# Patient Record
Sex: Female | Born: 1945 | Race: White | Hispanic: No | Smoking: Never smoker
Health system: Southern US, Community
[De-identification: ages and names within clinical notes are randomized; demographics above are authoritative.]

## PROBLEM LIST (undated history)

## (undated) DIAGNOSIS — I10 Essential (primary) hypertension: Secondary | ICD-10-CM

## (undated) DIAGNOSIS — F419 Anxiety disorder, unspecified: Secondary | ICD-10-CM

## (undated) DIAGNOSIS — M109 Gout, unspecified: Secondary | ICD-10-CM

## (undated) DIAGNOSIS — I350 Nonrheumatic aortic (valve) stenosis: Secondary | ICD-10-CM

## (undated) DIAGNOSIS — H409 Unspecified glaucoma: Secondary | ICD-10-CM

## (undated) DIAGNOSIS — F32A Depression, unspecified: Secondary | ICD-10-CM

## (undated) DIAGNOSIS — F329 Major depressive disorder, single episode, unspecified: Secondary | ICD-10-CM

## (undated) DIAGNOSIS — R002 Palpitations: Secondary | ICD-10-CM

## (undated) DIAGNOSIS — M1990 Unspecified osteoarthritis, unspecified site: Secondary | ICD-10-CM

## (undated) DIAGNOSIS — E785 Hyperlipidemia, unspecified: Secondary | ICD-10-CM

## (undated) DIAGNOSIS — R55 Syncope and collapse: Principal | ICD-10-CM

## (undated) HISTORY — DX: Hyperlipidemia, unspecified: E78.5

## (undated) HISTORY — DX: Essential (primary) hypertension: I10

## (undated) HISTORY — PX: TONSILECTOMY, ADENOIDECTOMY, BILATERAL MYRINGOTOMY AND TUBES: SHX2538

## (undated) HISTORY — PX: SHOULDER SURGERY: SHX246

## (undated) HISTORY — PX: KNEE ARTHROPLASTY: SHX992

## (undated) HISTORY — DX: Palpitations: R00.2

## (undated) HISTORY — DX: Nonrheumatic aortic (valve) stenosis: I35.0

## (undated) HISTORY — PX: APPENDECTOMY: SHX54

## (undated) HISTORY — DX: Depression, unspecified: F32.A

## (undated) HISTORY — DX: Gout, unspecified: M10.9

## (undated) HISTORY — DX: Syncope and collapse: R55

## (undated) HISTORY — PX: PARTIAL HYSTERECTOMY: SHX80

## (undated) HISTORY — DX: Major depressive disorder, single episode, unspecified: F32.9

## (undated) HISTORY — PX: JOINT REPLACEMENT: SHX530

## (undated) HISTORY — DX: Unspecified osteoarthritis, unspecified site: M19.90

---

## 1999-02-12 ENCOUNTER — Ambulatory Visit (HOSPITAL_COMMUNITY): Admission: RE | Admit: 1999-02-12 | Discharge: 1999-02-12 | Payer: Self-pay | Admitting: Obstetrics and Gynecology

## 2005-01-14 ENCOUNTER — Ambulatory Visit: Payer: Self-pay | Admitting: Orthopedic Surgery

## 2005-01-21 ENCOUNTER — Ambulatory Visit (HOSPITAL_COMMUNITY): Admission: RE | Admit: 2005-01-21 | Discharge: 2005-01-21 | Payer: Self-pay | Admitting: Orthopedic Surgery

## 2005-01-27 ENCOUNTER — Ambulatory Visit: Payer: Self-pay | Admitting: Orthopedic Surgery

## 2005-02-09 ENCOUNTER — Inpatient Hospital Stay (HOSPITAL_COMMUNITY): Admission: RE | Admit: 2005-02-09 | Discharge: 2005-02-10 | Payer: Self-pay | Admitting: Orthopedic Surgery

## 2005-02-09 ENCOUNTER — Ambulatory Visit: Payer: Self-pay | Admitting: Orthopedic Surgery

## 2005-02-15 ENCOUNTER — Encounter: Payer: Self-pay | Admitting: Orthopedic Surgery

## 2005-02-18 ENCOUNTER — Ambulatory Visit: Payer: Self-pay | Admitting: Orthopedic Surgery

## 2005-03-04 ENCOUNTER — Ambulatory Visit: Payer: Self-pay | Admitting: Orthopedic Surgery

## 2005-03-25 ENCOUNTER — Ambulatory Visit: Payer: Self-pay | Admitting: Orthopedic Surgery

## 2005-05-10 ENCOUNTER — Ambulatory Visit: Payer: Self-pay | Admitting: Orthopedic Surgery

## 2005-06-21 ENCOUNTER — Ambulatory Visit: Payer: Self-pay | Admitting: Orthopedic Surgery

## 2005-08-30 ENCOUNTER — Ambulatory Visit: Payer: Self-pay | Admitting: Orthopedic Surgery

## 2005-12-02 ENCOUNTER — Ambulatory Visit: Payer: Self-pay | Admitting: Orthopedic Surgery

## 2005-12-13 ENCOUNTER — Ambulatory Visit: Payer: Self-pay | Admitting: Orthopedic Surgery

## 2005-12-20 ENCOUNTER — Ambulatory Visit: Payer: Self-pay | Admitting: Orthopedic Surgery

## 2005-12-27 ENCOUNTER — Ambulatory Visit: Payer: Self-pay | Admitting: Orthopedic Surgery

## 2006-01-27 ENCOUNTER — Ambulatory Visit: Payer: Self-pay | Admitting: Orthopedic Surgery

## 2007-07-26 ENCOUNTER — Ambulatory Visit: Payer: Self-pay | Admitting: Orthopedic Surgery

## 2007-07-26 DIAGNOSIS — M1710 Unilateral primary osteoarthritis, unspecified knee: Secondary | ICD-10-CM

## 2007-07-26 DIAGNOSIS — IMO0002 Reserved for concepts with insufficient information to code with codable children: Secondary | ICD-10-CM | POA: Insufficient documentation

## 2007-08-03 ENCOUNTER — Ambulatory Visit: Payer: Self-pay | Admitting: Orthopedic Surgery

## 2007-08-10 ENCOUNTER — Ambulatory Visit: Payer: Self-pay | Admitting: Orthopedic Surgery

## 2007-08-15 ENCOUNTER — Ambulatory Visit: Payer: Self-pay | Admitting: Orthopedic Surgery

## 2007-09-21 ENCOUNTER — Ambulatory Visit: Payer: Self-pay | Admitting: Orthopedic Surgery

## 2007-12-25 ENCOUNTER — Ambulatory Visit: Payer: Self-pay | Admitting: Orthopedic Surgery

## 2008-01-16 ENCOUNTER — Telehealth: Payer: Self-pay | Admitting: Orthopedic Surgery

## 2008-01-16 ENCOUNTER — Encounter: Payer: Self-pay | Admitting: Orthopedic Surgery

## 2008-01-17 ENCOUNTER — Telehealth: Payer: Self-pay | Admitting: Orthopedic Surgery

## 2008-01-23 ENCOUNTER — Ambulatory Visit: Payer: Self-pay | Admitting: Orthopedic Surgery

## 2008-01-23 ENCOUNTER — Inpatient Hospital Stay (HOSPITAL_COMMUNITY): Admission: RE | Admit: 2008-01-23 | Discharge: 2008-01-26 | Payer: Self-pay | Admitting: Orthopedic Surgery

## 2008-01-25 ENCOUNTER — Encounter: Payer: Self-pay | Admitting: Orthopedic Surgery

## 2008-01-26 ENCOUNTER — Encounter: Payer: Self-pay | Admitting: Orthopedic Surgery

## 2008-01-29 ENCOUNTER — Encounter: Payer: Self-pay | Admitting: Orthopedic Surgery

## 2008-01-29 ENCOUNTER — Telehealth: Payer: Self-pay | Admitting: Orthopedic Surgery

## 2008-02-05 ENCOUNTER — Encounter: Payer: Self-pay | Admitting: Orthopedic Surgery

## 2008-02-06 ENCOUNTER — Ambulatory Visit: Payer: Self-pay | Admitting: Orthopedic Surgery

## 2008-02-06 DIAGNOSIS — Z96659 Presence of unspecified artificial knee joint: Secondary | ICD-10-CM | POA: Insufficient documentation

## 2008-02-16 ENCOUNTER — Telehealth: Payer: Self-pay | Admitting: Orthopedic Surgery

## 2008-02-26 ENCOUNTER — Encounter: Payer: Self-pay | Admitting: Orthopedic Surgery

## 2008-03-05 ENCOUNTER — Encounter: Payer: Self-pay | Admitting: Orthopedic Surgery

## 2008-03-06 ENCOUNTER — Ambulatory Visit: Payer: Self-pay | Admitting: Orthopedic Surgery

## 2008-04-15 ENCOUNTER — Encounter: Payer: Self-pay | Admitting: Orthopedic Surgery

## 2008-04-17 ENCOUNTER — Ambulatory Visit: Payer: Self-pay | Admitting: Orthopedic Surgery

## 2008-05-20 ENCOUNTER — Telehealth: Payer: Self-pay | Admitting: Orthopedic Surgery

## 2008-05-20 ENCOUNTER — Encounter: Payer: Self-pay | Admitting: Orthopedic Surgery

## 2008-05-21 ENCOUNTER — Inpatient Hospital Stay (HOSPITAL_COMMUNITY): Admission: RE | Admit: 2008-05-21 | Discharge: 2008-05-24 | Payer: Self-pay | Admitting: Orthopedic Surgery

## 2008-05-21 ENCOUNTER — Ambulatory Visit: Payer: Self-pay | Admitting: Orthopedic Surgery

## 2008-05-23 ENCOUNTER — Encounter: Payer: Self-pay | Admitting: Orthopedic Surgery

## 2008-05-24 ENCOUNTER — Encounter: Payer: Self-pay | Admitting: Orthopedic Surgery

## 2008-05-25 ENCOUNTER — Encounter: Payer: Self-pay | Admitting: Orthopedic Surgery

## 2008-05-27 ENCOUNTER — Encounter (INDEPENDENT_AMBULATORY_CARE_PROVIDER_SITE_OTHER): Payer: Self-pay | Admitting: Pulmonary Disease

## 2008-05-27 ENCOUNTER — Telehealth: Payer: Self-pay | Admitting: Orthopedic Surgery

## 2008-05-28 ENCOUNTER — Telehealth: Payer: Self-pay | Admitting: Orthopedic Surgery

## 2008-06-04 ENCOUNTER — Ambulatory Visit: Payer: Self-pay | Admitting: Orthopedic Surgery

## 2008-06-05 ENCOUNTER — Encounter: Payer: Self-pay | Admitting: Orthopedic Surgery

## 2008-06-11 ENCOUNTER — Encounter: Payer: Self-pay | Admitting: Orthopedic Surgery

## 2008-06-11 ENCOUNTER — Telehealth: Payer: Self-pay | Admitting: Orthopedic Surgery

## 2008-06-17 ENCOUNTER — Encounter: Payer: Self-pay | Admitting: Orthopedic Surgery

## 2008-07-01 ENCOUNTER — Encounter: Payer: Self-pay | Admitting: Orthopedic Surgery

## 2008-07-03 ENCOUNTER — Ambulatory Visit: Payer: Self-pay | Admitting: Orthopedic Surgery

## 2008-07-03 DIAGNOSIS — M25519 Pain in unspecified shoulder: Secondary | ICD-10-CM | POA: Insufficient documentation

## 2008-07-03 HISTORY — DX: Pain in unspecified shoulder: M25.519

## 2008-07-24 ENCOUNTER — Encounter: Payer: Self-pay | Admitting: Orthopedic Surgery

## 2008-08-05 ENCOUNTER — Encounter: Payer: Self-pay | Admitting: Orthopedic Surgery

## 2008-08-14 ENCOUNTER — Ambulatory Visit: Payer: Self-pay | Admitting: Orthopedic Surgery

## 2009-01-29 ENCOUNTER — Ambulatory Visit: Payer: Self-pay | Admitting: Orthopedic Surgery

## 2009-01-31 ENCOUNTER — Encounter: Payer: Self-pay | Admitting: Orthopedic Surgery

## 2010-05-18 ENCOUNTER — Ambulatory Visit: Payer: Self-pay | Admitting: Orthopedic Surgery

## 2010-05-18 DIAGNOSIS — M19079 Primary osteoarthritis, unspecified ankle and foot: Secondary | ICD-10-CM | POA: Insufficient documentation

## 2010-05-26 ENCOUNTER — Telehealth: Payer: Self-pay | Admitting: Orthopedic Surgery

## 2010-06-05 ENCOUNTER — Telehealth: Payer: Self-pay | Admitting: Orthopedic Surgery

## 2010-06-25 ENCOUNTER — Encounter: Payer: Self-pay | Admitting: Orthopedic Surgery

## 2010-09-22 NOTE — Progress Notes (Signed)
Summary: Appointment with Dr. Lestine Box.  Phone Note Outgoing Call   Call placed by: Waldon Reining,  June 05, 2010 10:06 AM Call placed to: Patient Action Taken: Appt scheduled Summary of Call: Patient has an appointment with Dr. Lestine Box on 06-23-10 at 2:30.

## 2010-09-22 NOTE — Assessment & Plan Note (Signed)
Summary: YEARLY TKA RE-CK/XRAYS BILAT KNEES/[employer] FED/CAF   Visit Type:  Follow-up  CC:  recheck bilateral TKA's.  History of Present Illness: I saw [name] in the office today for a followup visit.  She is a 65 years old woman with the complaint of:    s/p bilateral TKA, for xrays today.  Lt 04/2008  Rt 01/2008  Doing WELL, some pain lateral right leg, been going on for 8 months, no injury. She can scratch the lateral part of right leg with finger nails and feel tingling IN THE right knee.  CURRENT MEDS:  Celebrex 200mg  1 in the morning and 1 in the evening for right foot pain, for OA. Vicodin 5 when needed  Gabapentin 100mg  in the am and pm  helps for the foot pain,     Allergies: 1)  ! Sulfa  Physical Exam  Additional Exam:     *GEN: appearance was normal   ** CDV: normal pulses temperature and no edema  * SKIN was normal   * Neuro:abnormal sensation, lateral to the incision near the fibular head there is tenderess and tinels sign  ** Psyche: AAO x 3 and mood was normal   MSK *Gait was normal  *Inspection the right knee looked great, no effusion, there is tenderness over ITB distally  *ROM- 120 degrees  *Motor- 5/5  *Stability-is nomal      Impression & Recommendations:  Problem # 1:  TOTAL KNEE FOLLOW-UP (ICD-V43.65) Assessment Improved  Orders: Est. Patient Level III (16109) Knees  x-ray bilateral ([id number]) The  alignment was normal, PTF reduced without tilt or subluxation, no evidence of loosening.  IMPRESSION: normal appearance of the implant   Problem # 2:  ARTHRITIS, RIGHT FOOT (ICD-716.97) Assessment: Comment Only I looked at the x-rays that she had from the podiatrist and she has some midtarsal arthritis which I think is better treated by a foot and ankle specialist vs. my treatment. Orders: Orthopedic Surgeon Referral (Ortho Surgeon)  Medications Added to Medication List This Visit: 1)  Vicodin 5-500 Mg Tabs  (Hydrocodone-acetaminophen) .Marland Kitchen.. 1 by mouth q 4 as needed pain 2)  Gabapentin 100 Mg Caps (Gabapentin) .Marland Kitchen.. 1 by mouth three times a day   [90 dya supply]  Patient Instructions: 1)  Consult Dr Lestine Box for mid foot arthritis  2)  yearly f/u for TKA  Prescriptions: VICODIN 5-500 MG TABS (HYDROCODONE-ACETAMINOPHEN) 1 by mouth q 4 as needed pain  #90 x 5   Entered and Authorized by:   Fuller Canada MD   Signed by:   Fuller Canada MD on 05/18/2010   Method used:   Print then Give to Patient   RxID:   [id number] CELEBREX 100MG    #90 day suppl x 0   Entered and Authorized by:   Fuller Canada MD   Signed by:   Fuller Canada MD on 05/18/2010   Method used:   Print then Give to Patient   RxID:   [id number] GABAPENTIN 100 MG CAPS (GABAPENTIN) 1 by mouth three times a day   [90 dya supply]  #270 x 5   Entered and Authorized by:   Fuller Canada MD   Signed by:   Fuller Canada MD on 05/18/2010   Method used:   Print then Give to Patient   RxID:   [id number] VICODIN 5-500 MG TABS (HYDROCODONE-ACETAMINOPHEN) 1 by mouth q 4 as needed pain  #90 x 5   Entered and Authorized by:   Fuller Canada MD   Signed  by:   Fuller Canada MD on 05/18/2010   Method used:   Handwritten   RxID:   [id number]

## 2010-09-22 NOTE — Consult Note (Signed)
Summary: Consult note from Dr, Lestine Box  Consult note from Dr, Lestine Box   Imported By: Jacklynn Ganong 06/25/2010 15:04:51  _____________________________________________________________________  External Attachment:    Type:   Image     Comment:   External Document

## 2010-09-22 NOTE — Progress Notes (Signed)
Summary: Referral to Dr. Lestine Box  Phone Note Outgoing Call   Call placed by: Waldon Reining,  May 26, 2010 3:35 PM Call placed to: Specialist Action Taken: Information Sent Summary of Call: I faxed a referral for this patient to Dr. Lestine Box to be seen for arthritis of her right foot.

## 2011-01-05 NOTE — Group Therapy Note (Signed)
NAMEKAILANI, BRASS NO.:  [id number]   MEDICAL RECORD NO.:  [id number]          PATIENT TYPE:  INP   LOCATION:  A302                          FACILITY:  [facility]   PHYSICIAN:  Vickki Hearing, M.D.DATE OF BIRTH:  February 15, 1946   DATE OF PROCEDURE:  DATE OF DISCHARGE:                                 PROGRESS NOTE   This is her postop day #2 status post right total knee replacement.  Her  protime is 1.7.  Her hemoglobin is 13.7.  She is very upset this  morning.  She has a clean wound, minimal swelling.  She is  neurovascularly intact.  Plan is to continue physical therapy.      Vickki Hearing, M.D.  Electronically Signed     SEH/MEDQ  D:  01/25/2008  T:  01/25/2008  Job:  [id number]

## 2011-01-05 NOTE — Op Note (Signed)
NAMETAVON, CORRIHER NO.:  [id number]   MEDICAL RECORD NO.:  [id number]          PATIENT TYPE:  INP   LOCATION:  A302                          FACILITY:  [facility]   PHYSICIAN:  Vickki Hearing, M.D.DATE OF BIRTH:  1946/05/31   DATE OF PROCEDURE:  01/23/2008  DATE OF DISCHARGE:                               OPERATIVE REPORT   HISTORY:  She is 65 year old female with chronic knee pain, was treated  nonoperatively with analgesics, activity modification, attempted weight  loss, Synvisc injections, cortisone injection did not improve, presented  for right total knee replacement for right knee arthritis.   PREOPERATIVE DIAGNOSIS:  Osteoarthritis, right knee.   POSTOPERATIVE DIAGNOSIS:  Osteoarthritis, right knee.   PROCEDURE:  Right total knee arthroplasty.   IMPLANTS:  We used a DePuy Sigma posterior stabilized implants, 3 femur,  a size 2.5 tibia, a size 10 polyethylene, a 32 patella, 8-mm thickness.   SURGEON:  Vickki Hearing, MD   ASSISTED BY:  Margaree Mackintosh and Gregg Nation.   ANESTHETIC:  General.   OPERATIVE FINDINGS:  There was a complete cartilage loss on the medial  femoral condyle and tibial plateau.  There was mild varus malalignment  and mild flexion contracture.  Preop flexion was 125 degrees under  anesthesia.   The patient was identified in the preop area.  Proper site  identification and marking performed on the right knee.  History and  physical update and starting, and the patient was taken to the operating  room.  After general anesthesia, she was given antibiotics, Foley  catheter was inserted, prepped with DuraPrep, and draped sterilely.  Time-out procedure was then completed.  Tourniquet was elevated after  exsanguination of the limb, it was elevated 300 mmHg where it stayed for  84 minutes.   With the knee in flexion, a midline incision was made followed by medial  arthrotomy.  The medial soft tissue was dissected to the mid  coronal  plane. ACL and PCL were resected.  Menisci were resected.  Tibia was  subluxated forward.  At that point, the cartilage loss was noted.   The tibial guide was set for a neutral cut referencing from the high  lateral side, 10-mm bone resection was performed, tibia was then  measured to a 2.5 with the tibial tray.  Femur was set for 5-degree  right, 10-mm distal resection.  A drill was passed through the canal,  canal was suctioned and irrigated, distal cutting block was pinned, and  10 mm of bone was resected, the left medial side was the higher side.   Spacer blocks were then placed with a 10 spacer with a shim and this  gave full extension.   The femur was then sized to a 3.5, we pinned for 3 downsizing the femur,  then the anterior/posterior and chamfer cuts were made.the flexion and  extension gaps were tested , again they were balanced with a 10-mm  insert, then I made the box cut.  We then measured the patella to a size  24-mm thickness, resected 8 mm of bone, and sized for a 32 patella  implant.  Trial reduction was then performed.  Full extension was obtained.  Flexion to 115 degrees was obtained.  There was no collateral ligament  instability.  AP in stability was also intact. Patella tracked normally.  The tibial preparation was completed by ream and punch.   The knee was washed and dried.  The implants were cemented in place.  Excess cement was removed.  The cement was cured.  Radiograph was  obtained.  A 10-mm insert was placed prior to radiographic evaluation.  The implants were in good position.  The wound was irrigated again.  Closure was performed with #1 Bralon suture in interrupted fashion  followed by 0 Monocryl and 2-0 Monocryl over a pain pump catheter.  A 30  mL of Marcaine was injected into the soft tissue prior to closure.   Staples were used to reapproximate the skin edges.  Sterile dressings,  Cryo/Cuff, and Ace bandages were applied.  The patient  was extubated and  taken to recovery in stable condition.   POSTOPERATIVE PLAN:  For standard postop total knee protocol using  Coumadin and foot pumps for DVT prevention.      Vickki Hearing, M.D.  Electronically Signed     SEH/MEDQ  D:  01/23/2008  T:  01/23/2008  Job:  [id number]

## 2011-01-05 NOTE — H&P (Signed)
NAMEKENDELLE, SCHWEERS NO.:  [id number]   MEDICAL RECORD NO.:  [id number]          PATIENT TYPE:  AMB   LOCATION:  DAY                           FACILITY:  [facility]   PHYSICIAN:  Vickki Hearing, M.D.DATE OF BIRTH:  Feb 09, 1946   DATE OF ADMISSION:  DATE OF DISCHARGE:  LH                              HISTORY & PHYSICAL   CHIEF COMPLAINT:  Left knee pain.   HISTORY:  This is a 65 year old female status post right total knee  arthroplasty January 23, 2008.  She did well.   She complains of increasing left knee pain, weakness getting out of a  chair, difficulty with activities of daily living related to the left  knee.  Her pain has become severe.  She presents for a left total knee  arthroplasty.   MEDICATIONS:  Include:  1. Ziac 2.5.  2. Celebrex 100 mg.  3. Dyazide.  4. Ultracet.  5. Lorazepam 0.5.  6. Effexor XR 75 mg.  7. Co-Q10.  8. One-A-Day vitamin.  9. Fish oil.  10.Neurontin 300 mg 3 times a day.  11.Robaxin 500 mg q.6 h. p.r.n.   ALLERGIES:  TO SULFA.   MEDICAL PROBLEMS:  None other than arthritis and hypertension.   PAST SURGICAL HISTORY:  1. Hysterectomy.  2. Appendectomy.  3. Tonsillectomy.  4. Right rotator cuff repair June 2006 by Dr. Romeo Apple.   FAMILY HISTORY:  Noncontributory.   SOCIAL HISTORY:  Negative.   EXAMINATION:  VITAL SIGNS:  Are stable and normal.  APPEARANCE OF THE PATIENT:  Normal development, nutrition, grooming,  hygiene;  body habitus endomorphic.  CARDIOVASCULAR OBSERVATION:  No swelling or varicosities.  PALPATION:  Pulses and temperature normal.  No edema or tenderness.  LYMPHATIC SYSTEM:  Negative axilla, cervical, supraclavicular, groin.  SKIN:  Lower extremities normal.  Surgical incisions well healed on the  right shoulder and right knee.  NEUROLOGICAL:  Coordination normal.  Reflexes normal x4.  Sensation  normal x4 extremities.  Orientation to time,  person, place.  Mood and  affect normal.  UPPER  EXTREMITIES:  Negative for malalignment, contracture, subluxation,  tremor or spasticity.  RIGHT LOWER EXTREMITY:  0 to 115 degrees knee flexion and range of  motion, strength, muscle tone, stability normal.  LEFT KNEE:  She had significant medial joint line tenderness.  Flexion  is about 120 degrees with a 5 degrees extensor lag.  Strength is normal.  Stability is normal.  Muscle tone is normal   Our most recent x-rays show severe medial compartment narrowing with  patellofemoral spurs inferiorly and superiorly consistent with  osteoarthritis.   PLAN:  Left total knee arthroplasty with DePuy knee system.  Surgery  scheduled for May 21, 2008, with a 2-week follow-up appointment.      Vickki Hearing, M.D.  Electronically Signed     SEH/MEDQ  D:  05/20/2008  T:  05/20/2008  Job:  [id number]

## 2011-01-05 NOTE — Op Note (Signed)
NAMEDESTANE, SPEAS NO.:  [id number]   MEDICAL RECORD NO.:  [id number]          PATIENT TYPE:  INP   LOCATION:  A304                          FACILITY:  [facility]   PHYSICIAN:  Vickki Hearing, M.D.DATE OF BIRTH:  [birth year]   DATE OF PROCEDURE:  05/21/2008  DATE OF DISCHARGE:                               OPERATIVE REPORT   She is now 65 years old.  She presented with osteoarthritis of her left  knee status post right total knee arthroplasty.  She complained of  increasing pain.  She complained of trouble with activities of daily  living, and she failed conservative treatment of oral medication,  exercise, and injection.   PREOPERATIVE DIAGNOSIS:  Osteoarthritis of left knee.   POSTOPERATIVE DIAGNOSIS:  Osteoarthritis of left knee.   PROCEDURE:  Left total knee arthroplasty with DePuy Sigma posterior-  stabilized total knee system, 2-1/2 femur, 2-1/2 tibia, 10-mm cross-  linked polyethylene insert, size 35 patella.   SURGEON:  Vickki Hearing, MD.   ASSISTANTS:  Deniece Portela McFatter and [place] Nation.   ANESTHETIC:  General.   OPERATIVE FINDINGS:  There was a varus knee with a flexion contracture  of 5 degrees.  The varus deformity was mild.  The preop flexion was 115  degrees under anesthesia.  The medial side was severely worn.  The  patellofemoral joint had significant osteophytes and a grade 3 lesion  over the medial facet.   The procedure was done as follows.  The patient was identified as  [name].  She marked the left knee as the surgical site and I  countersigned it, I then updated her history and physical.  She was  started on Ancef and taken to surgery where she had general anesthetic.  She had a Foley catheter placed.  She had the left knee prepped with  DuraPrep and draped sterilely.   At this point, the time-out was completed.  All agreed that a left total  knee arthroplasty was to be performed.  All parameters were  satisfied.   The limb was exsanguinated with a 6-inch Esmarch.  The tourniquet was  inflated at 300 mmHg.  The knee was placed in flexion and a midline  incision was made.  The incision was carried down to the extensor  mechanism.  A medial arthrotomy was performed and the patella was  everted.  The patellofemoral ligament was released.  The medial soft  tissue sleeve was elevated to the mid-coronal plane.  The fat pad was  excised along with the ACL and PCL and the tibia was subluxated forward.   The femur was prepared by drilling the femoral canal, decompression and  with suction and irrigation and setting the cut up for 5-degree left  with 10-mm resection.  This was pinned and the resection was made.  An  additional 2 mm were resected.   With the tibia subluxated forward, the tibial cut was set for neutral  cut referencing the lateral side, the unworn side, with an 8-mm  resection.  This was made with an oscillating saw.   The extension gap was checked and a 10-mm spacer  block gave a nice fit  with collateral ligament stability.   The femur was sized between a 2.5 and a 3, and we chose the 2.5  effectively downsizing the femur.  We checked the epicondylar axis and  pinned in external rotation and then made 4 cuts on the femur with a 4  in 1 cutting block.  We cleaned out the posterior condyles with an  osteotome resecting bone and soft tissue and releasing the posterior  capsule.  We placed the box cutting guide, pinned it in place assuring  that there was no medial overhang, and made the box cut.  We then  skeletonized the patella, measured a 22-mm thickness and resected it  down to a 12-mm thickness, and measured a size 35 button.   All trials were then placed.  The knee was taken through a range of  motion and the tibial tray was set at the rotation dictated by range of  motion.   The lug drills holes were drilled, the femoral component and patella  component was removed,  and the patella had excellent tracking.  We then  punched the tibia.   The knee was irrigated, the bone was dried, and the cement was mixed on  the back table.  The implants were then implanted with cement.  Excess  cement was removed and the cement was allowed to cure.   After all components were in place, a trial 10-mm insert was placed.  We  achieved normal patellar tracking, 115 degrees of knee flexion, 2  degrees of hyperextension, and collateral ligament stability.   Trial insert was removed.  Permanent insert was placed and 10-mm cross-  linked polyethylene posterior stabilized insert.   The wound was then irrigated.  The soft tissues were injected 30 mL of  Marcaine with epinephrine.  Knee was closed in flexion with  #1 interrupted fashion for the extensor mechanism.  We closed the subcu  over a pain pump with 0 Monocryl.  We applied staples.  We took x-rays  interop, checked the knee range of motion.  There was no subluxation of  the patella.  We had 2 degrees hyperextension and 115 degrees of knee  flexion.   Sterile bandages were applied.  Cryo/Cuff was applied.  The patient was  extubated and taken to recovery room in stable condition.   The patient will be on foot pumps and Coumadin.  She will get a CPM  machine and start early motion and physical therapy.  She is requesting  to go home on Thursday.      Vickki Hearing, M.D.  Electronically Signed     SEH/MEDQ  D:  05/21/2008  T:  05/21/2008  Job:  [id number]

## 2011-01-05 NOTE — H&P (Signed)
[name], [name] NO.:  [id number]   MEDICAL RECORD NO.:  [id number]          PATIENT TYPE:  AMB   LOCATION:  DAY                           FACILITY:  [facility]   PHYSICIAN:  Vickki Hearing, M.D.DATE OF BIRTH:  1945-12-10   DATE OF ADMISSION:  01/23/2008  DATE OF DISCHARGE:  LH                              HISTORY & PHYSICAL   CHIEF COMPLAINT:  Right knee pain.   HISTORY:  This is a 65 year old female who has been followed with right  knee pain for several months.  She has had nonoperative treatment which  included but was not limited to rest, activity modifications, Synvisc  injections analgesic medications.  She did not improve; her function  continued to decrease.  She was advised to have knee replacement.  She  reviewed the total knee patient information.   We discussed the risks and benefits of the procedure, completed the  informed consent process in the office.   MEDICATIONS:  1. Ziac 2.5 mg.  2. Dyazide.  3. Lorazepam 0.5 mg.  4. Effexor XR 75 mg.  5. Coenzyme Q.  6. One A Day vitamin.  7. Fish oil.  8. Vicodin pain medicine.  9. Celebrex 100 mg.   CURRENT ALLERGIES:  SULFONAMIDES.   PAST MEDICAL HISTORY:  Negative.   PAST SURGICAL HISTORY:  1. Hysterectomy.  2. Appendectomy.  3. Tonsillectomy.  4. Right rotator cuff repair, February 09, 2005, at [facility] by      Dr. Romeo Apple.   FAMILY HISTORY:  Noncontributory.   SOCIAL HISTORY:  Noncontributory.   REVIEW OF SYSTEMS:  Negative general, cardiac, respiratory, GI, GU,  neuro, endocrine, psych, derm, ENT, immunologic and lymphatic.   EXAM:  Vital signs are normal.  APPEARANCE:  Well-developed and nourished; grooming and hygiene are  normal; body habitus mildly overweight.  HEAD:  Normocephalic, atraumatic.  LUNGS:  Clear to auscultation and percussion bilaterally.  HEART:  Rate and rhythm normal, S1 and S2 without murmurs, rubs, gallops  or clicks.  Pulses are normal in all  4 extremities.  EXTREMITIES:  No clubbing, cyanosis, edema or deformity.  SKIN:  Intact without palpable lesions or visible rashes.  CERVICAL LYMPH NODES:  Negative.  PSYCHIATRIC:  She is awake, alert and oriented x3 with normal mood and  affect, normal attention and concentration.  RIGHT UPPER EXTREMITY:  Normal appearance, range of motion, strength,  and stability.  LEFT UPPER EXTREMITY:  Medial scapular border was tender, consistent  with trigger point.  No impingement signs were noted.  No instability  was noted; strength was normal.  KNEE EXAMINATION:  Heel-to-toe gait pattern noted bilaterally.  There  was tenderness, mild swelling of the right knee with range of motion 120  degrees with 5-degree flexion contracture.  Knee stability was normal.   IMPRESSION:  Osteoarthritis of the right knee, 715.96.   PLAN:  Right total knee arthroplasty with DePuy implant.   Informed consent process included discussion of bleeding, infection  which would cause a need for 2-stage reimplantation and antibiotics;  stiffness if therapy protocol not followed, and other problems with knee  replacements that  can occur.   The patient's preoperative appointment is set for May 28; postop  appointment is set for June 16.      Vickki Hearing, M.D.  Electronically Signed     SEH/MEDQ  D:  01/22/2008  T:  01/22/2008  Job:  [id number]   cc:   [facility]

## 2011-01-05 NOTE — Discharge Summary (Signed)
NAMEJAYDAN, [name] NO.:  [id number]   MEDICAL RECORD NO.:  [id number]          PATIENT TYPE:  INP   LOCATION:  A304                          FACILITY:  [facility]   PHYSICIAN:  Vickki Hearing, M.D.DATE OF BIRTH:  05-11-1946   DATE OF ADMISSION:  05/21/2008  DATE OF DISCHARGE:  10/02/2009LH                               DISCHARGE SUMMARY   ADMITTING DIAGNOSIS:  Osteoarthritis of the left knee.   DISCHARGE DIAGNOSIS:  Osteoarthritis of the left knee.   She had a left total knee arthroplasty done by Dr. Romeo Apple on the day  of admission.   IMPLANTS USED:  She had a DePuy sigma posterior stabilized total knee  with a 2.5 femur, 2.5 tibia, a 10-mm cross-link polyethylene insert, and  a 35-mm patella.   HISTORY:  She is 65 years old.  She has arthritis of the left knee.  She  is status post right total knee arthroplasty.  She did well.  She  complained of increasing plain in the left knee.  She complained of  trouble with her activities of daily living.  She failed conservative  treatment of all medications, exercises, and injection and presented for  left total knee.   HOSPITAL COURSE:  Her hospital course was unremarkable.  She did well  and progressed well on therapy, had no major problems.  Hemoglobin at  discharge was 12 and INR was 1.5.   DISCHARGE MEDICATIONS:  1. Lorcet Plus 1 q.4h. p.r.n. for pain.  2. Coumadin 2.5 mg every day for 7 days.  3. She was to resume Wellbutrin XR 150 mg a day.  4. Neurontin 300 mg three times a day.  5. Celebrex 200 mg a day.  6. Triamterene and hydrochlorothiazide daily.  7. Lorazepam 0.5 q.6h. p.r.n. anxiety.  8. Pravastatin 20 mg orally daily.  9. Travatan both eyes daily.  10.Bisop/HCTZ 2.5/6.25 mg daily.   She was to follow up with me on June 04, 2008.  She had advanced home  care.  She had physical therapy to be done at home.   OVERALL CONDITION:  Improved.   CONDITION AT DISCHARGE:  Improved.  She was  discharged to home.      Vickki Hearing, M.D.  Electronically Signed     SEH/MEDQ  D:  06/25/2008  T:  06/26/2008  Job:  [id number]

## 2011-01-05 NOTE — Group Therapy Note (Signed)
NAMENICKAYLA, [name] NO.:  [id number]   MEDICAL RECORD NO.:  [id number]          PATIENT TYPE:  INP   LOCATION:  A302                          FACILITY:  [facility]   PHYSICIAN:  Vickki Hearing, M.D.DATE OF BIRTH:  [birth year]   DATE OF PROCEDURE:  DATE OF DISCHARGE:                                 PROGRESS NOTE   Postop day #1, right total knee arthroplasty.  Vital signs:  Blood  pressure 97.9, blood pressure 136/63, pain level 4 to 6, on Vicodin and  PCA pump.  She is doing well.  Pain has subsided significantly from  yesterday.  Neurovascular:  Leg is intact.  Dressing is dry and clean.   Labs show normal metabolic panel, protime is 1.2, hemoglobin is 13.7,  starting hemoglobin was 16.4.   She is in stable condition.  Plan is to start physical therapy.  We are  going to stop the iron and we will add 5 mg of Coumadin tonight.      Vickki Hearing, M.D.  Electronically Signed     SEH/MEDQ  D:  01/24/2008  T:  01/24/2008  Job:  [id number]

## 2011-01-08 NOTE — Discharge Summary (Signed)
NAMELUVERTA, KORTE NO.:  [id number]   MEDICAL RECORD NO.:  [id number]          PATIENT TYPE:  INP   LOCATION:  A327                          FACILITY:  [facility]   PHYSICIAN:  Vickki Hearing, M.D.DATE OF BIRTH:  Feb 22, 1946   DATE OF ADMISSION:  02/09/2005  DATE OF DISCHARGE:  06/21/2006LH                                 DISCHARGE SUMMARY   ADMISSION DIAGNOSIS:  Right rotator cuff tear.   HISTORY:  This is a 65 year old female who dislocated her right shoulder  October 20, 2004. She had a closed reduction under anesthesia by Dr.  Lorn Junes in [place]. She was immobilized for 6 weeks and then went on to  physical therapy. In physical therapy she never really regained her motion  and continued to have pain. She presented to me for an evaluation. We  scheduled her for an MRI. The MRI was done and showed a complete tear of the  supraspinatus and subscapularis with a dislocation of the biceps tendon. She  also had some infraspinatus tendinopathy superior labral tearing and  tendinopathy of the long head of the biceps tendon. Due to lack of motion,  weakness, it was recommended that she have surgery. She consented for  surgery understanding the risks and benefits of the procedure. Mainly that  she may not regain full motion of the arm. She accepted these risks and  presented on February 09, 2005 for surgery.   She underwent rotator cuff repair, open procedure involving the  supraspinatus and subscapularis tendons via delta pectoral approach on February 09, 2005. Operative findings included a complete retraction and tear of the  subscapularis tendon approximately 3 cm. She had a U shaped tear of the  supraspinatus tendon with retraction and poor distal tendon tissue and  therefore we added a Biograft to the repair to assist in healing.   She was admitted for pain control and for observation for neurovascular  checks. She tolerated that well, and was discharged on February 10, 2005.   DISPOSITION:  To home. She was in an immobilizer. She was scheduled to  follow up in the office on February 18, 2005. She was discharged on Lorcet +1  q.4h, Skelaxin 800 mg p.o. q.8h. She had restrictions which were given to  her about her upper extremity in terms of motion. Overall condition was  improved.   ADDENDUM:  She had a tissue mend Biograft placed and had three corkscrew II  suture anchors placed with size 2 fiber wire already attached. She was also  given a pain pump catheter.       SEH/MEDQ  D:  03/13/2005  T:  03/13/2005  Job:  [id number]

## 2011-01-08 NOTE — H&P (Signed)
[name], RENFRO NO.:  [id number]   MEDICAL RECORD NO.:  [id number]          PATIENT TYPE:  INP   LOCATION:  A327                          FACILITY:  [facility]   PHYSICIAN:  Vickki Hearing, M.D.DATE OF BIRTH:  03-27-1946   DATE OF ADMISSION:  DATE OF DISCHARGE:  LH                                HISTORY & PHYSICAL   PREOPERATIVE DIAGNOSIS:  Right rotator cuff tear.   HISTORY:  This is a 65 year old female who dislocated her right shoulder on  October 20, 2004. She had a closed reduction under anesthesia by Dr.  Lorn Junes in [place]. She was immobilized for 6 weeks and then went to PT  for 4 weeks. She complains of motion loss and pain. She had a MRI done which  showed complete tear of the supraspinatus and subscapularis and dislocation  of biceps tendon. She also had some infraspinatus tendinopathy, superior  labral tearing, and some tendinopathy of the long head of biceps tendon. The  patient came to me after her shoulder failure failed to progress during the  rehabilitation.   REVIEW OF SYSTEMS:  She has outlined history of depression, anxiety,  sinusitis, glaucoma, seasonal allergies, sinus problems, and bee sting  allergies. The other six systems were normal by the patient's own report.   ALLERGIES:  She is allergic to SULFA medications.   PAST MEDICAL HISTORY:  She has no major medical problems. She has had an  appendectomy, hysterectomy and tonsillectomy.   CURRENT MEDICATIONS:  1.  Ziac 2.5 mg.  2.  Celebrex.  3.  Dyazide 37.5 mg.  4.  Ultracet.  5.  Lorazepam 0.5 mg.  6.  Effexor 75 mg.  7.  One-A-Day vitamin.  8.  Coenzyme q. 10 a.m.  9.  MSN.  10. Fish oil.   FAMILY HISTORY:  Arthritis, cancer, diabetes.   FAMILY PHYSICIAN:  Dr. Donette Larry at [facility].   SOCIAL HISTORY:  She is married. She is housewife. She does not smoke, drink  or use caffeine. She completed her education through the 12th grade.   PHYSICAL  EXAMINATION:  GENERAL:  She is normally developed, well nourished.  Grooming and hygiene are excellent. No gross deformities. Body habitus is  mesomorphic.  PSYCHOLOGICAL EXAM:  She is alert and oriented x3 with normal mood.  NEUROLOGIC EXAM:  Shows normal sensation in her entire right upper  extremity.  CARDIOVASCULAR EXAM:  Observation and palpation are normal.  SKIN:  Normal.  CHEST:  Clear.  HEART:  Rate and rhythm normal.  ABDOMEN:  Soft.  EXTREMITIES:  Right shoulder active range of motion 30 degrees abduction.  Strength could not test. Stability could not test. Inspection showed  tenderness in the teres shoulder area.   Her passive range of motion was 110 of flexion, 40 of external rotation.  Internal rotation did not test. Gross supraspinatus and subscapularis  weakness is noted.   DATA:  MRI findings are as stated.   DIAGNOSIS:  Rotator cuff tear, supraspinatus and subscapularis, status post  dislocation, right shoulder.   PLAN:  Repair of supraspinatus and subscapularis tendons through a  deltopectoral approach.  I have explained the risk and benefits of the  procedure, severity and difficulty of the repair, expect four months to one  year recovery time, and expect a range of motion loss. This has been  communicated to the patient. The patient understands and wishes to go ahead  with the surgery.       SEH/MEDQ  D:  02/08/2005  T:  02/09/2005  Job:  [id number]

## 2011-01-08 NOTE — Discharge Summary (Signed)
[name], [name] NO.:  [id number]   MEDICAL RECORD NO.:  [id number]          PATIENT TYPE:  INP   LOCATION:  A302                          FACILITY:  [facility]   PHYSICIAN:  Vickki Hearing, M.D.DATE OF BIRTH:  1946-02-20   DATE OF ADMISSION:  01/23/2008  DATE OF DISCHARGE:  06/05/2009LH                               DISCHARGE SUMMARY   She was admitted with a diagnosis of osteoarthritis of the right knee.   OPERATIVE PROCEDURES:  On June 2, she had an uncomplicated right total  knee arthroplasty with a DePuy posterior stabilized size 3 femur, size  2.5 tibia with a 10 polyethylene insert, 32 patella with 8 mm of  thickness.  Surgeon was Romeo Apple, done under general anesthetic.   OPERATIVE FINDINGS:  Complete cartilage loss in the medial femoral  condyle and tibial plateau.  There was mild varus malalignment and mild  flexion contracture.  Preop flexion was 125 under anesthesia.   HOSPITAL COURSE:  The patient did well in the hospital.  She was on a  standard total knee protocol.  Her discharge INR was 1.7.  June 5, her  hemoglobin was 12.9.  She ambulated well and tolerated CPM machine well.   At discharge, she was afebrile, vital signs were stable, wound was  clean.  She was ambulatory.  She was told to follow up in 2 weeks.  She  was discharged with physical therapy and home health.  Disposition was  to home.  Overall condition was improved.  She was discharged on  Coumadin and Vicodin 5 mg.  She was to resume all preoperative  medications which included, Ziac 2.5, Dyazide, Effexor XR 75 one a day,  vitamin fish oil, Celebrex 100 mg, lorazepam 0.5 mg p.r.n.      Vickki Hearing, M.D.  Electronically Signed     SEH/MEDQ  D:  02/07/2008  T:  02/08/2008  Job:  [id number]

## 2011-01-08 NOTE — Op Note (Signed)
NAMEMERRILY, TEGELER NO.:  [id number]   MEDICAL RECORD NO.:  [id number]          PATIENT TYPE:  INP   LOCATION:  A327                          FACILITY:  [facility]   PHYSICIAN:  Vickki Hearing, M.D.DATE OF BIRTH:  1946-08-06   DATE OF PROCEDURE:  02/09/2005  DATE OF DISCHARGE:                                 OPERATIVE REPORT   PREOPERATIVE DIAGNOSIS:  Rotator cuff tear, right shoulder, status post  right shoulder anterior dislocation. The supraspinatus and subscapularis  tendon were torn.   POSTOPERATIVE DIAGNOSIS:  Rotator cuff tear, right shoulder, status post  right shoulder anterior dislocation. The supraspinatus and subscapularis  tendon were torn.   PROCEDURE:  Open rotator cuff repair involving the supraspinatus and  subscapularis.   ANESTHESIA:  General endotracheal intubation.   SURGEON:  Vickki Hearing, M.D.   ASSISTANT:  Albion Nation, R.N.F.A.   SPECIMENS:  None.   BLOOD LOSS:  175 cc.   COMPLICATIONS:  None. The patient went to PACU in good condition.   INDICATIONS FOR PROCEDURE:  Persistent pain and stiffness and MRI documented  complete tear, subscapularis, and retracted tear, supraspinatus.   OPERATIVE FINDINGS:  The subscapularis had a 3-cm retracted tear with U-  shaped configuration in the supraspinatus. The subscapularis was completely  torn but scarred to the biceps tendon which was dislocated from the  bicipital groove.   DETAILS OF PROCEDURE:  The patient was properly identified as [name]. The right shoulder was marked for surgery, countersigned by the  surgeon. History and physical updated, and antibiotics were given. She was  taken to the operating room for general anesthesia. In a supine position  with a slight elevation of the table approximately 30 degrees, the patient's  right arm was prepped and draped in a sterile technique after satisfactory  anesthesia. A time out was taken. All parameters were  completed. Subcu  tissue was injected with Marcaine with epinephrine, and the skin incision  was made from the coracoid towards the axilla in the deltopectoral groove.  The cephalic vein was preserved after skin incision and the deltopectoral  interval was entered at this point. The clavipectoral fascia was dissected  until the lateral edge of the strap muscles were identified. Careful  dissection was carried out bluntly, and the musculocutaneous nerve was  palpated and protected, and the humeral head was subluxated forward by  extending the arm. At this point, the biceps tendon was found to identify  rotator interval, and the subscapularis tendon was found to be scarred to  the biceps lateral edge. The rotator cuff tear was seen with a bursal  covering over the humeral head which was excised, exposing the retracted U-  shaped supraspinatus tendon tear. After debridement, a side-to-side tear was  performed with #2 interrupted Ethibond sutures leaving a 1-cm distance  between the greater tuberosity and the remaining tendon. We placed a  Tissumend patch using interrupted nonabsorbable suture. We then dissected  the biceps tendon free, retracted it from the subscapularis, placed holding  sutures in the subscapularis, and then placed three suture anchors in the  bone and then tied  six sutures into the subscapularis with the arm  internally rotated. We were able to get 30 degrees of external rotation  after the repair with no undue tension on the suture line.   The rotator interval was repaired as well as recreating the bicipital groove  transverse ligament. We irrigated the wound, injected with Marcaine 30 cc  plain, and then closed the skin over a pain pump catheter with 2-0 Monocryl  and staples. We applied sterile dressings, CryoCuff, shoulder immobilizer.  Extubated the patient, took her to recovery room in stable condition.   Postoperative plan is for three weeks of immobilization  followed by external  rotation 0 to 30 degrees for an additional 3 weeks. Abduction in the plane  of the scapula as tolerated at that time, then after six weeks advance range  of motion and strengthening exercises to tolerance.   The patient will be admitted.       SEH/MEDQ  D:  02/09/2005  T:  02/09/2005  Job:  [id number]

## 2011-05-19 LAB — CBC
HCT: 46.2 — ABNORMAL HIGH
MCHC: 35.6
Platelets: 230
WBC: 8.4

## 2011-05-19 LAB — DIFFERENTIAL
Basophils Absolute: 0
Basophils Relative: 0
Eosinophils Absolute: 0.1
Lymphocytes Relative: 30
Monocytes Relative: 5
Neutrophils Relative %: 62

## 2011-05-19 LAB — CROSSMATCH: Antibody Screen: NEGATIVE

## 2011-05-19 LAB — BASIC METABOLIC PANEL
BUN: 13
Chloride: 100
Creatinine, Ser: 1.08
GFR calc Af Amer: >60
Glucose, Bld: 160 — ABNORMAL HIGH
Potassium: 3.9
Sodium: 141

## 2011-05-20 LAB — CBC
HCT: 36
HCT: 37
Hemoglobin: 12.9
MCHC: 36
MCHC: 36
MCV: 94.1
Platelets: 190
RBC: 3.94
RDW: 12.4
WBC: 10.4
WBC: 11.6 — ABNORMAL HIGH

## 2011-05-20 LAB — DIFFERENTIAL
Basophils Absolute: 0
Basophils Relative: 0
Basophils Relative: 0
Eosinophils Absolute: 0.2
Eosinophils Absolute: 0.3
Eosinophils Relative: 1
Eosinophils Relative: 2
Lymphocytes Relative: 26
Lymphs Abs: 3
Monocytes Absolute: 0.9
Monocytes Absolute: 1.4 — ABNORMAL HIGH
Monocytes Relative: 12
Neutro Abs: 7
Neutrophils Relative %: 67
Neutrophils Relative %: 76

## 2011-05-20 LAB — BASIC METABOLIC PANEL
BUN: 7
BUN: 7
BUN: 8
CO2: 34 — ABNORMAL HIGH
CO2: 34 — ABNORMAL HIGH
Calcium: 8.8
Calcium: 9
Chloride: 100
Creatinine, Ser: 0.77
GFR calc Af Amer: >60
GFR calc non Af Amer: >60
GFR calc non Af Amer: >60
Glucose, Bld: 120 — ABNORMAL HIGH
Glucose, Bld: 138 — ABNORMAL HIGH
Glucose, Bld: 193 — ABNORMAL HIGH
Potassium: 3.3 — ABNORMAL LOW
Sodium: 134 — ABNORMAL LOW

## 2011-05-20 LAB — PROTIME-INR: INR: 1.8 — ABNORMAL HIGH

## 2011-05-24 LAB — BASIC METABOLIC PANEL
BUN: 13
BUN: 7
CO2: 29
CO2: 32
CO2: 31
CO2: 33 — ABNORMAL HIGH
Calcium: 10.1
Calcium: 9.1
Chloride: 101
Chloride: 98
Creatinine, Ser: 0.65
GFR calc Af Amer: >60
GFR calc Af Amer: >60
GFR calc Af Amer: >60
GFR calc Af Amer: >60
Glucose, Bld: 145 — ABNORMAL HIGH
Glucose, Bld: 105 — ABNORMAL HIGH
Glucose, Bld: 118 — ABNORMAL HIGH
Potassium: 3.7
Sodium: 139
Sodium: 139

## 2011-05-24 LAB — CROSSMATCH

## 2011-05-24 LAB — HEMOGLOBIN AND HEMATOCRIT, BLOOD: HCT: 44

## 2011-05-24 LAB — CBC
HCT: 40.3
HCT: 37.5
Hemoglobin: 14.1
Hemoglobin: 13.1
MCHC: 35
MCHC: 34.4
MCHC: 34.9
MCV: 94.3
MCV: 94.4
RBC: 4.31
RBC: 3.7 — ABNORMAL LOW
RBC: 3.98
RDW: 12.4
RDW: 12.6
RDW: 12.7

## 2011-05-24 LAB — DIFFERENTIAL
Basophils Relative: 0
Basophils Relative: 0
Basophils Relative: 0
Eosinophils Absolute: 0.3
Eosinophils Absolute: 0.4
Eosinophils Relative: 2
Eosinophils Relative: 3
Lymphocytes Relative: 16
Monocytes Absolute: 0.8
Monocytes Absolute: 1.2 — ABNORMAL HIGH
Monocytes Relative: 8
Monocytes Relative: 10
Monocytes Relative: 8
Neutro Abs: 7
Neutrophils Relative %: 61

## 2011-05-24 LAB — PROTIME-INR
INR: 1.1
INR: 1.5
Prothrombin Time: 14.1

## 2011-08-10 ENCOUNTER — Telehealth: Payer: Self-pay | Admitting: Orthopedic Surgery

## 2011-08-10 NOTE — Telephone Encounter (Signed)
Patient requests refill on her Celebrex through her mail order pharmacy; states this medication requires authorization. Her yearly re-check appointment is re-scheduled into January due to our schedule changes and patient's preference. Please advise regarding refill.  Best contact ph# is her cell, [phone].

## 2011-08-11 NOTE — Telephone Encounter (Signed)
We do not do mail orders if it cant be a regular script

## 2011-08-12 ENCOUNTER — Other Ambulatory Visit: Payer: Self-pay | Admitting: *Deleted

## 2011-08-12 ENCOUNTER — Ambulatory Visit: Payer: Self-pay | Admitting: Orthopedic Surgery

## 2011-08-12 NOTE — Telephone Encounter (Signed)
Routed to nurse for contact to patient per Dr Mort Sawyers reply.

## 2011-08-25 NOTE — Telephone Encounter (Signed)
i can send this electronically to her mail order pharmacy, however, i do not see this med in her chart past or present, will need directions, qty, etc

## 2011-08-26 MED ORDER — CELECOXIB 100 MG PO CAPS: 100.0000 mg | ORAL_CAPSULE | Freq: Two times a day (BID) | ORAL | Status: DC

## 2011-08-26 NOTE — Telephone Encounter (Signed)
Ask her what the dose is

## 2011-08-26 NOTE — Telephone Encounter (Signed)
Spoke with patient, sent script to pharmacy requested

## 2011-09-07 ENCOUNTER — Telehealth: Payer: Self-pay | Admitting: Orthopedic Surgery

## 2011-09-07 NOTE — Telephone Encounter (Signed)
[name] called this morning to give you the fax # for [facility] mail order Fax # [phone]

## 2011-09-08 MED ORDER — CELECOXIB 100 MG PO CAPS: 100.0000 mg | ORAL_CAPSULE | Freq: Two times a day (BID) | ORAL | Status: DC

## 2011-09-08 NOTE — Telephone Encounter (Signed)
Faxed as requested

## 2011-09-15 ENCOUNTER — Encounter: Payer: Self-pay | Admitting: Orthopedic Surgery

## 2011-09-15 ENCOUNTER — Ambulatory Visit (INDEPENDENT_AMBULATORY_CARE_PROVIDER_SITE_OTHER): Payer: Federal, State, Local not specified - PPO | Admitting: Orthopedic Surgery

## 2011-09-15 VITALS — BP 120/70 | Ht 64.0 in | Wt 204.0 lb

## 2011-09-15 DIAGNOSIS — M179 Osteoarthritis of knee, unspecified: Secondary | ICD-10-CM | POA: Insufficient documentation

## 2011-09-15 DIAGNOSIS — M1710 Unilateral primary osteoarthritis, unspecified knee: Secondary | ICD-10-CM | POA: Insufficient documentation

## 2011-09-15 DIAGNOSIS — M19079 Primary osteoarthritis, unspecified ankle and foot: Secondary | ICD-10-CM | POA: Insufficient documentation

## 2011-09-15 DIAGNOSIS — Z96659 Presence of unspecified artificial knee joint: Secondary | ICD-10-CM | POA: Insufficient documentation

## 2011-09-15 MED ORDER — HYDROCODONE-ACETAMINOPHEN 7.5-325 MG PO TABS: 1.0000 | ORAL_TABLET | Freq: Four times a day (QID) | ORAL | Status: DC | PRN

## 2011-09-15 NOTE — Patient Instructions (Signed)
Activity as tolerated

## 2011-09-15 NOTE — Progress Notes (Signed)
Patient ID: [name], female   DOB: [birth year], 66 y.o.   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  The knees are functioning well she has no real complaints.  She was having some pain behind the lateral side of the RIGHT knee which has all but resolved.  She is still having difficulties with her foot and take Celebrex and 7.5 mg of hydrocodone to manage that.  Examination she is walking without support.  Range of motion in both knees as well within the functional range.  There is no instability and normal strength.   Past Medical History  Diagnosis Date  . Arthritis   . High blood pressure   . High cholesterol    ROS: mainly the foot, as  Stated. No neuo symptoms   X-rays show no complications related to the prostheses  X-ray report bilateral knee x-rays standing Reason for x-ray annual followup   3 views of each knee.  Alignment is normal.  No sign of loosening is seen.  Impression normal appearance bilateral knee prostheses.  Followup one year repeat films Continue hydrocodone and Celebrex for foot and ankle disease and pain

## 2011-11-29 ENCOUNTER — Telehealth: Payer: Self-pay | Admitting: Orthopedic Surgery

## 2011-11-29 ENCOUNTER — Other Ambulatory Visit: Payer: Self-pay | Admitting: Orthopedic Surgery

## 2011-11-29 DIAGNOSIS — M1710 Unilateral primary osteoarthritis, unspecified knee: Secondary | ICD-10-CM

## 2011-11-29 MED ORDER — CELECOXIB 100 MG PO CAPS: 100.0000 mg | ORAL_CAPSULE | Freq: Two times a day (BID) | ORAL | Status: DC

## 2011-11-29 NOTE — Telephone Encounter (Signed)
[name] is completely out of her Celebrex (100 mg takes 2 a day) due to insurance company needing a prior authorization. The request for prior  Authorization is in nurse's box.  She asked if you can call in enough to get her thru until she gets the other prescription authorized and filled to [facility]. Her # [phone]

## 2011-11-29 NOTE — Telephone Encounter (Signed)
Med sent.

## 2011-12-02 NOTE — Telephone Encounter (Signed)
Per nurse's note, meds sent

## 2012-02-07 ENCOUNTER — Other Ambulatory Visit: Payer: Self-pay | Admitting: *Deleted

## 2012-02-07 MED ORDER — CELECOXIB 100 MG PO CAPS: 100.0000 mg | ORAL_CAPSULE | Freq: Two times a day (BID) | ORAL | Status: DC

## 2012-02-10 ENCOUNTER — Other Ambulatory Visit: Payer: Self-pay | Admitting: *Deleted

## 2012-02-10 MED ORDER — CELECOXIB 100 MG PO CAPS: 100.0000 mg | ORAL_CAPSULE | Freq: Two times a day (BID) | ORAL | Status: DC

## 2012-02-10 NOTE — Progress Notes (Signed)
Cancelled script at [facility] and sent to [facility] per patient request

## 2012-04-17 ENCOUNTER — Other Ambulatory Visit: Payer: Self-pay | Admitting: Orthopedic Surgery

## 2012-04-17 NOTE — Telephone Encounter (Signed)
Routed to nurse.

## 2012-04-17 NOTE — Telephone Encounter (Signed)
Please have patient schedule an appointment to discuss her narcotic pain medication use

## 2012-04-18 NOTE — Telephone Encounter (Signed)
WAS SENT IN ERROR

## 2012-06-26 ENCOUNTER — Other Ambulatory Visit: Payer: Self-pay | Admitting: Orthopedic Surgery

## 2012-10-03 ENCOUNTER — Other Ambulatory Visit: Payer: Self-pay | Admitting: *Deleted

## 2012-10-03 DIAGNOSIS — M25569 Pain in unspecified knee: Secondary | ICD-10-CM

## 2012-10-03 MED ORDER — CELECOXIB 100 MG PO CAPS: 100.0000 mg | ORAL_CAPSULE | Freq: Two times a day (BID) | ORAL | Status: DC

## 2013-01-11 ENCOUNTER — Ambulatory Visit (INDEPENDENT_AMBULATORY_CARE_PROVIDER_SITE_OTHER): Payer: Federal, State, Local not specified - PPO | Admitting: Orthopedic Surgery

## 2013-01-11 ENCOUNTER — Encounter: Payer: Self-pay | Admitting: Orthopedic Surgery

## 2013-01-11 ENCOUNTER — Ambulatory Visit (INDEPENDENT_AMBULATORY_CARE_PROVIDER_SITE_OTHER): Payer: Federal, State, Local not specified - PPO

## 2013-01-11 VITALS — BP 160/82 | Ht 64.0 in | Wt 202.0 lb

## 2013-01-11 DIAGNOSIS — Z96651 Presence of right artificial knee joint: Secondary | ICD-10-CM

## 2013-01-11 DIAGNOSIS — M4716 Other spondylosis with myelopathy, lumbar region: Secondary | ICD-10-CM | POA: Insufficient documentation

## 2013-01-11 DIAGNOSIS — Z96659 Presence of unspecified artificial knee joint: Secondary | ICD-10-CM

## 2013-01-11 DIAGNOSIS — M51379 Other intervertebral disc degeneration, lumbosacral region without mention of lumbar back pain or lower extremity pain: Secondary | ICD-10-CM | POA: Insufficient documentation

## 2013-01-11 DIAGNOSIS — M5137 Other intervertebral disc degeneration, lumbosacral region: Secondary | ICD-10-CM

## 2013-01-11 DIAGNOSIS — M5430 Sciatica, unspecified side: Secondary | ICD-10-CM | POA: Insufficient documentation

## 2013-01-11 MED ORDER — GABAPENTIN 100 MG PO CAPS: 100.0000 mg | ORAL_CAPSULE | Freq: Two times a day (BID) | ORAL | Status: DC

## 2013-01-11 MED ORDER — HYDROCODONE-ACETAMINOPHEN 7.5-325 MG PO TABS: 1.0000 | ORAL_TABLET | Freq: Four times a day (QID) | ORAL | Status: DC | PRN

## 2013-01-11 NOTE — Progress Notes (Signed)
Patient ID: [name], female   DOB: 01/12/1946, 67 y.o.   MRN: [id number] Chief Complaint  Patient presents with  . Follow-up    Yearly recheck bilateral knees and new problem right hip pain    History this is a 67 year old female with bilateral knee replacements here for her routine yearly checkups presents with new onset back pain buttock pain and radiation down the left leg occasionally down the right leg which started 3-4 months ago became severe 2 weeks ago requiring a visit to primary care who put her on gabapentin with mild relief. She subsequently sought chiropractic manipulation in treatment and did well with a machine that they're using there for pain and heat treatment. The pain is described as dull burning pain which reached 10 out of 10 level currently 5/10 level which now comes and goes and is better with ice gabapentin and hydrocodone. She's taking more hydrocodone than usual as she was on a when necessary basis. Pain is worse with sitting standing for long periods of time and is increased with flexion. She denies any catching or locking in her knees denies any numbness or tingling but has a dull aching pain which runs down her leg  Review of systems She reports no weight loss blurred vision chest pain shortness of breath heartburn or frequency she does complain of anxiety and depression denies numbness tingling skin changes bleeding bruising excessive thirst or urination does report seasonal allergies  Past Medical History  Diagnosis Date  . Arthritis   . High blood pressure   . High cholesterol    Past Surgical History  Procedure Laterality Date  . Joint replacement    . Appendectomy    . Tonsilectomy, adenoidectomy, bilateral myringotomy and tubes    . Partial hysterectomy    . Knee arthroplasty Right   . Knee arthroplasty Left    BP 160/82  Ht 5\' 4"  (1.626 m)  Wt 202 lb (91.627 kg)  BMI 34.66 kg/m2 Body habitus mesomorphic ambulation is normal General  appearance is normal, the patient is alert and oriented x3 with normal mood and affect. She has painful flexion of the spine no pain with extension, tenderness in the lumbar spine. Muscle tone normal. Skin normal.  Straight leg raise negative on the left is positive on the right with Lasegue's sign.  Lower extremities 2 well-healed incisions over both knees flexion 120 knees are stable strength is normal scans intact she has good distal pulses bilaterally equal reflexes at the knee and ankle at 1-2+ slight decreased sensation in the left dorsal foot otherwise normal.  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  Recommend MRI lumbar spine in order to do epidural steroids, continue chiropractic treatment along with gabapentin hydrocodone  Followup by phone call

## 2013-01-25 ENCOUNTER — Telehealth: Payer: Self-pay | Admitting: Orthopedic Surgery

## 2013-01-25 NOTE — Telephone Encounter (Signed)
Regarding MRI, CPT [id number], followed up with insurer [employer], ph# [phone], due to no response to 01/17/13 fax of clinicals for pre-certification to fax# [phone]. Per Ysidro Evert, -  no pre-authorization is required for [employer]' diagnostic imaging.  Her name and today's date for reference:  01/25/13, 12:29 p.m.  Called [facility], scheduled MRI appointment for: tomorrow, 01/26/13, 4:00p.m, -- NOTE:  Patient unable to go at this time due to husband's illness and other appointment conflicts. Patient to return call with additional dates .Marland KitchenMarland Kitchen

## 2013-01-26 ENCOUNTER — Other Ambulatory Visit (HOSPITAL_COMMUNITY): Payer: Federal, State, Local not specified - PPO

## 2013-01-26 NOTE — Telephone Encounter (Signed)
MRI (RE-Scheduled) per patient request to Thursday, 02/08/13 4:00pm, 3:45pm registration.  Patient aware.  As noted, Patient to be called with results.

## 2013-02-08 ENCOUNTER — Ambulatory Visit (HOSPITAL_COMMUNITY)
Admission: RE | Admit: 2013-02-08 | Discharge: 2013-02-08 | Disposition: A | Discharge status: Home / Self Care | Payer: Federal, State, Local not specified - PPO | Source: Ambulatory Visit | Attending: Orthopedic Surgery | Admitting: Orthopedic Surgery

## 2013-02-08 DIAGNOSIS — M5126 Other intervertebral disc displacement, lumbar region: Secondary | ICD-10-CM | POA: Insufficient documentation

## 2013-02-08 DIAGNOSIS — M545 Low back pain, unspecified: Secondary | ICD-10-CM | POA: Insufficient documentation

## 2013-02-08 DIAGNOSIS — M5430 Sciatica, unspecified side: Secondary | ICD-10-CM

## 2013-02-08 DIAGNOSIS — M79609 Pain in unspecified limb: Secondary | ICD-10-CM | POA: Insufficient documentation

## 2013-02-10 ENCOUNTER — Telehealth: Payer: Self-pay | Admitting: Orthopedic Surgery

## 2013-02-10 NOTE — Telephone Encounter (Signed)
I spoke to the patient she has spinal stenosis she's having pain in the center of her back radiating down the right or left leg worse with sitting but symptoms are intermittent  Her husband is very healed this time she cannot do any other interventions at this point  If necessary epidural injections can be ordered and she will call us back her MRI is good until 02/09/2015

## 2013-04-12 ENCOUNTER — Other Ambulatory Visit: Payer: Self-pay | Admitting: *Deleted

## 2013-04-12 ENCOUNTER — Telehealth: Payer: Self-pay | Admitting: Orthopedic Surgery

## 2013-04-12 DIAGNOSIS — M5430 Sciatica, unspecified side: Secondary | ICD-10-CM

## 2013-04-12 MED ORDER — GABAPENTIN 100 MG PO CAPS: 100.0000 mg | ORAL_CAPSULE | Freq: Two times a day (BID) | ORAL | Status: DC

## 2013-04-12 NOTE — Telephone Encounter (Signed)
Called [facility] and got Prescription for Celebrex 100 mg bid approved from 03/20/2013 through 03/20/2014. He will fax approval letter to go to scanning.

## 2013-04-12 NOTE — Telephone Encounter (Signed)
Effie Shy left a message asking for a call from the nurse about the prior authorization for medicine.  [facility] pharmacy said it is wrong Her # [phone]

## 2013-06-12 ENCOUNTER — Telehealth: Payer: Self-pay | Admitting: Orthopedic Surgery

## 2013-06-12 NOTE — Telephone Encounter (Signed)
Patient called to request appointment for evaluation of left knee, states had a fall last night, "fell from chair", and states this is a knee on which she had a total knee replacement.  Reviewed with patient, option of going to Emergency Room for work-up, as well as our office scheduling the first available appointment; patient states she does not wish to go to an Emergency Room or Urgent Care.  I encouraged patient to pursue this in event she may need to have knee evaluated sooner, although we have placed her on wait list as well.  Appointment scheduled.

## 2013-06-18 ENCOUNTER — Ambulatory Visit: Payer: Federal, State, Local not specified - PPO | Admitting: Orthopedic Surgery

## 2013-06-20 ENCOUNTER — Ambulatory Visit (INDEPENDENT_AMBULATORY_CARE_PROVIDER_SITE_OTHER): Payer: Medicare Other

## 2013-06-20 ENCOUNTER — Ambulatory Visit: Payer: Federal, State, Local not specified - PPO | Admitting: Orthopedic Surgery

## 2013-06-20 ENCOUNTER — Ambulatory Visit (INDEPENDENT_AMBULATORY_CARE_PROVIDER_SITE_OTHER): Payer: Medicare Other | Admitting: Orthopedic Surgery

## 2013-06-20 VITALS — BP 119/63 | Ht 63.0 in | Wt 187.0 lb

## 2013-06-20 DIAGNOSIS — S8000XA Contusion of unspecified knee, initial encounter: Secondary | ICD-10-CM | POA: Insufficient documentation

## 2013-06-20 DIAGNOSIS — S93609A Unspecified sprain of unspecified foot, initial encounter: Secondary | ICD-10-CM | POA: Insufficient documentation

## 2013-06-20 DIAGNOSIS — M25562 Pain in left knee: Secondary | ICD-10-CM

## 2013-06-20 DIAGNOSIS — M542 Cervicalgia: Secondary | ICD-10-CM

## 2013-06-20 DIAGNOSIS — M79609 Pain in unspecified limb: Secondary | ICD-10-CM

## 2013-06-20 DIAGNOSIS — M79672 Pain in left foot: Secondary | ICD-10-CM

## 2013-06-20 DIAGNOSIS — S8002XA Contusion of left knee, initial encounter: Secondary | ICD-10-CM

## 2013-06-20 DIAGNOSIS — M25569 Pain in unspecified knee: Secondary | ICD-10-CM

## 2013-06-20 DIAGNOSIS — S93602A Unspecified sprain of left foot, initial encounter: Secondary | ICD-10-CM

## 2013-06-20 DIAGNOSIS — S139XXA Sprain of joints and ligaments of unspecified parts of neck, initial encounter: Secondary | ICD-10-CM | POA: Insufficient documentation

## 2013-06-20 MED ORDER — CYCLOBENZAPRINE HCL 10 MG PO TABS: 10.0000 mg | ORAL_TABLET | Freq: Three times a day (TID) | ORAL | Status: DC | PRN

## 2013-06-20 MED ORDER — HYDROCODONE-ACETAMINOPHEN 7.5-325 MG PO TABS: 1.0000 | ORAL_TABLET | ORAL | Status: AC | PRN

## 2013-06-20 NOTE — Progress Notes (Signed)
Patient ID: [name], female   DOB: [birth year], 67 y.o.   MRN: [id number] Chief Complaint  Patient presents with  . Pain    Pain in left knee, left foot, and neck d/t fall 06/11/2013    History this is a 67 year old female who had a left total knee replacement several years ago had a recent x-ray in May which show stable prosthesis. She unfortunately still on a dining room chair and fell out of the chair complains of left knee pain left foot pain and neck pain. Her date of injury was 06/18/13. She was offered an appointment at that time but could not get in. She complains of sharp dull constant pain mainly in the cervical spine. The initial knee swelling progressed to foot swelling in both of those have gotten better.  She's not having any numbness or tingling but having severe shoulder pain around the trapezius muscles the midline of the cervical spine associated with neck stiffness  Her review of systems is negative except for the musculoskeletal complaints  She is allergic to sulfa medications  She's had surgery on her knees her shoulder she's had an appendectomy, hysterectomy, tonsillectomy. She is currently on Celebrex, Wellbutrin, valsartan pain, Ativan, pravastatin, eyedrops, hydrocodone 7.5 for chronic pain. Gabapentin. She did try some Flexeril and that did help her neck pain  She has a family history of heart disease arthritis cancer and diabetes  Her social history is notable for effective she is married she does not smoke or drink she completed high school education  Her height is 5 foot 3 inches her weight is 189 pounds her blood pressure is 119/63  She is in obvious discomfort. Overall appearance is normal.  She is oriented x3 mood and affect are flat  Ambulation is without assistive device. Stride length is decreased. Stride cadence is less.  Cervical spine. She is tender at the base of the cervical spine and over the right and left trapezius muscle left greater  than right. There is also decreased range of motion increased muscle tension skin is intact  Left knee no swelling no tenderness flexion 120 stability normal strength normal skin normal  Left foot no tenderness no swelling full range of motion ankle stable strength normal no atrophy skin intact  X-rays The x-ray of the knee compared to the previous film shows no change  The foot x-rays normal except for arthritis  The cervical spine x-rays show cervical spondylosis without fracture there is slight anterior translation of C7 on T1 without facet joint disruption indicating chronic disease  At this point I think she sprained her neck has a contusion to the knee and the foot  The neck seems to be the major component of problem at this time  Recommend Flexeril and heat followup with me in 3 weeks

## 2013-06-20 NOTE — Patient Instructions (Signed)
Flexeril  Refill hydrocodone  Heat apply to neck

## 2013-07-12 ENCOUNTER — Ambulatory Visit (INDEPENDENT_AMBULATORY_CARE_PROVIDER_SITE_OTHER): Payer: Medicare Other | Admitting: Orthopedic Surgery

## 2013-07-12 ENCOUNTER — Encounter: Payer: Self-pay | Admitting: Orthopedic Surgery

## 2013-07-12 VITALS — BP 144/64 | Ht 64.0 in | Wt 202.0 lb

## 2013-07-12 DIAGNOSIS — Z5189 Encounter for other specified aftercare: Secondary | ICD-10-CM

## 2013-07-12 DIAGNOSIS — S93602D Unspecified sprain of left foot, subsequent encounter: Secondary | ICD-10-CM

## 2013-07-12 DIAGNOSIS — S139XXD Sprain of joints and ligaments of unspecified parts of neck, subsequent encounter: Secondary | ICD-10-CM

## 2013-07-12 DIAGNOSIS — S8002XD Contusion of left knee, subsequent encounter: Secondary | ICD-10-CM

## 2013-07-12 NOTE — Progress Notes (Signed)
Patient ID: [name], female   DOB: [birth year], 67 y.o.   MRN: [id number] Chief Complaint  Patient presents with  . Follow-up    3 week recheck left knee , left foot, and neck    BP 144/64  Ht 5\' 4"  (1.626 m)  Wt 202 lb (91.627 kg)  BMI 34.66 kg/m2  Status post fall contusion left knee left foot and cervical spine. She says all 3 areas are better. She has some mild tenderness over the medial aspect of her knee and some pain when she turns her neck to the right. She says the foot is completely back to normal.  She denies numbness tingling in the upper extremities and no weakness in the left leg  BP 144/64  Ht 5\' 4"  (1.626 m)  Wt 202 lb (91.627 kg)  BMI 34.66 kg/m2 General appearance is normal, the patient is alert and oriented x3 with normal mood and affect. Knee flexion normal knee stability normal no foot pain no tenderness no swelling reflexes are 2+ and equal elbow and forearm with negative Hoffman sign  Resolved contusions  Followup for bilateral knee films which will be on June 2015

## 2013-10-29 ENCOUNTER — Other Ambulatory Visit: Payer: Self-pay | Admitting: *Deleted

## 2013-10-29 DIAGNOSIS — M25569 Pain in unspecified knee: Secondary | ICD-10-CM

## 2013-10-29 MED ORDER — CELECOXIB 100 MG PO CAPS: 100.0000 mg | ORAL_CAPSULE | Freq: Two times a day (BID) | ORAL | Status: DC

## 2014-02-07 ENCOUNTER — Ambulatory Visit (INDEPENDENT_AMBULATORY_CARE_PROVIDER_SITE_OTHER): Payer: Medicare Other

## 2014-02-07 ENCOUNTER — Ambulatory Visit (INDEPENDENT_AMBULATORY_CARE_PROVIDER_SITE_OTHER): Payer: Medicare Other | Admitting: Orthopedic Surgery

## 2014-02-07 ENCOUNTER — Encounter: Payer: Self-pay | Admitting: Orthopedic Surgery

## 2014-02-07 VITALS — BP 144/71 | Ht 64.0 in | Wt 150.0 lb

## 2014-02-07 DIAGNOSIS — M1710 Unilateral primary osteoarthritis, unspecified knee: Secondary | ICD-10-CM

## 2014-02-07 DIAGNOSIS — Z96652 Presence of left artificial knee joint: Secondary | ICD-10-CM

## 2014-02-07 DIAGNOSIS — Z96651 Presence of right artificial knee joint: Secondary | ICD-10-CM

## 2014-02-07 DIAGNOSIS — Z96659 Presence of unspecified artificial knee joint: Secondary | ICD-10-CM

## 2014-02-07 DIAGNOSIS — IMO0002 Reserved for concepts with insufficient information to code with codable children: Secondary | ICD-10-CM

## 2014-02-07 DIAGNOSIS — M179 Osteoarthritis of knee, unspecified: Secondary | ICD-10-CM | POA: Insufficient documentation

## 2014-02-07 NOTE — Progress Notes (Signed)
Patient ID: [name], female   DOB: [birth year], 68 y.o.   MRN: [id number]  Chief Complaint  Patient presents with  . Follow-up    2009 both tka' s     History this is the  51  Yr annual followup status post right and left  knee replacement  The patient is not having any pain in the knee The patient's function with normal activities is excellent   Review of systems musculoskeletal the patient denies catching locking or giving way in the knee    General appearance is normal, the patient is alert and oriented x3 with normal mood and affect. Bilateral knees  Knee flexion is  120+ The knee is stable in the anterior posterior and medial lateral plane There is no tenderness or swelling  Status post right and left  total knee  A followup x-ray will be performed at one year from now

## 2014-02-13 ENCOUNTER — Telehealth: Payer: Self-pay

## 2014-02-13 NOTE — Telephone Encounter (Signed)
Pt said that [facility] was going or had already referred her to [place] to set up colonoscopy. She said that someone from our office called last week, but nothing is documented of this taking place. I told patient that the triage nurse will be calling her back. She agreed. Please call [phone]

## 2014-02-19 ENCOUNTER — Encounter (INDEPENDENT_AMBULATORY_CARE_PROVIDER_SITE_OTHER): Payer: Self-pay | Admitting: *Deleted

## 2014-02-20 NOTE — Telephone Encounter (Signed)
I called pt. She has an appt at Dr. Olevia Perches office with Deberah Castle on 03/27/2014. Pt is now aware that it was not our office that called her.

## 2014-03-27 ENCOUNTER — Ambulatory Visit (INDEPENDENT_AMBULATORY_CARE_PROVIDER_SITE_OTHER): Payer: Medicare Other | Admitting: Internal Medicine

## 2015-01-31 ENCOUNTER — Encounter (INDEPENDENT_AMBULATORY_CARE_PROVIDER_SITE_OTHER): Payer: Self-pay | Admitting: *Deleted

## 2015-02-11 ENCOUNTER — Ambulatory Visit (INDEPENDENT_AMBULATORY_CARE_PROVIDER_SITE_OTHER): Payer: Medicare Other | Admitting: Orthopedic Surgery

## 2015-02-11 ENCOUNTER — Ambulatory Visit (INDEPENDENT_AMBULATORY_CARE_PROVIDER_SITE_OTHER): Payer: Medicare Other

## 2015-02-11 ENCOUNTER — Encounter: Payer: Self-pay | Admitting: Orthopedic Surgery

## 2015-02-11 ENCOUNTER — Ambulatory Visit: Payer: Medicare Other

## 2015-02-11 VITALS — BP 144/77 | Ht 64.0 in | Wt 150.0 lb

## 2015-02-11 DIAGNOSIS — Z96652 Presence of left artificial knee joint: Secondary | ICD-10-CM | POA: Diagnosis not present

## 2015-02-11 DIAGNOSIS — M5430 Sciatica, unspecified side: Secondary | ICD-10-CM | POA: Diagnosis not present

## 2015-02-11 DIAGNOSIS — M1710 Unilateral primary osteoarthritis, unspecified knee: Secondary | ICD-10-CM

## 2015-02-11 DIAGNOSIS — M129 Arthropathy, unspecified: Secondary | ICD-10-CM

## 2015-02-11 DIAGNOSIS — Z96651 Presence of right artificial knee joint: Secondary | ICD-10-CM | POA: Diagnosis not present

## 2015-02-11 MED ORDER — GABAPENTIN 100 MG PO CAPS: 100.0000 mg | ORAL_CAPSULE | Freq: Two times a day (BID) | ORAL | Status: DC

## 2015-02-11 MED ORDER — HYDROCODONE-ACETAMINOPHEN 7.5-325 MG PO TABS: 1.0000 | ORAL_TABLET | ORAL | Status: DC | PRN

## 2015-02-11 NOTE — Progress Notes (Signed)
Patient ID: [name], female   DOB: 11-22-1945, 69 y.o.   MRN: [id number]  Chief Complaint  Patient presents with  . Follow-up    yearly follow up + xray bilateral TKA, RT 6/09, LT 9/09    Encounter Diagnoses  Name Primary?  Marland Kitchen Arthritis of knee Yes  . Sciatica, unspecified laterality     History this patient has had bilateral knee replacements and 2009 presents for 7 year follow-up x-rays. She complains of no problems with her knees  Review of systems she does have intermittent chronic back pain and she takes gabapentin for intermittent neurologic symptoms and 7.5 mg of hydrocodone for severe back pain Intermittent paresthesias in both lower extremities   Past Medical History  Diagnosis Date  . Arthritis   . High blood pressure   . High cholesterol     BP 144/77 mmHg  Ht 5\' 4"  (1.626 m)  Wt 150 lb (68.04 kg)  BMI 25.73 kg/m2 On inspection of both knees the incisions are well-healed she has no joint effusions her knee flexion on the right is 110 on the left with 120 both knees are stable she has excellent extension power neurovascular exam shows no abnormality spell  Bilateral x-rays ordered and interpreted as normal stable postop total knee replacements with no complications  Follow-up in a year for repeat x-rays at that time continue 7.5 mg hydrocodone and gabapentin  Meds ordered this encounter  Medications  . gabapentin (NEURONTIN) 100 MG capsule    Sig: Take 1 capsule (100 mg total) by mouth 2 (two) times daily.    Dispense:  60 capsule    Refill:  4  . HYDROcodone-acetaminophen (NORCO) 7.5-325 MG per tablet    Sig: Take 1 tablet by mouth every 4 (four) hours as needed for moderate pain.    Dispense:  60 tablet    Refill:  0

## 2015-05-06 ENCOUNTER — Other Ambulatory Visit: Payer: Self-pay | Admitting: Orthopedic Surgery

## 2015-05-07 ENCOUNTER — Emergency Department (HOSPITAL_COMMUNITY)
Admission: EM | Admit: 2015-05-07 | Discharge: 2015-05-07 | Disposition: A | Discharge status: Home / Self Care | Payer: Medicare Other | Attending: Emergency Medicine | Admitting: Emergency Medicine

## 2015-05-07 ENCOUNTER — Emergency Department (HOSPITAL_COMMUNITY): Payer: Medicare Other

## 2015-05-07 ENCOUNTER — Encounter (HOSPITAL_COMMUNITY): Payer: Self-pay | Admitting: *Deleted

## 2015-05-07 DIAGNOSIS — Z9889 Other specified postprocedural states: Secondary | ICD-10-CM | POA: Insufficient documentation

## 2015-05-07 DIAGNOSIS — M1990 Unspecified osteoarthritis, unspecified site: Secondary | ICD-10-CM | POA: Insufficient documentation

## 2015-05-07 DIAGNOSIS — Y9289 Other specified places as the place of occurrence of the external cause: Secondary | ICD-10-CM | POA: Insufficient documentation

## 2015-05-07 DIAGNOSIS — E780 Pure hypercholesterolemia: Secondary | ICD-10-CM | POA: Diagnosis not present

## 2015-05-07 DIAGNOSIS — X58XXXA Exposure to other specified factors, initial encounter: Secondary | ICD-10-CM | POA: Insufficient documentation

## 2015-05-07 DIAGNOSIS — Y998 Other external cause status: Secondary | ICD-10-CM | POA: Diagnosis not present

## 2015-05-07 DIAGNOSIS — Y9389 Activity, other specified: Secondary | ICD-10-CM | POA: Diagnosis not present

## 2015-05-07 DIAGNOSIS — Z791 Long term (current) use of non-steroidal anti-inflammatories (NSAID): Secondary | ICD-10-CM | POA: Diagnosis not present

## 2015-05-07 DIAGNOSIS — S46211A Strain of muscle, fascia and tendon of other parts of biceps, right arm, initial encounter: Secondary | ICD-10-CM | POA: Diagnosis not present

## 2015-05-07 DIAGNOSIS — Z79899 Other long term (current) drug therapy: Secondary | ICD-10-CM | POA: Diagnosis not present

## 2015-05-07 DIAGNOSIS — S4991XA Unspecified injury of right shoulder and upper arm, initial encounter: Secondary | ICD-10-CM | POA: Diagnosis present

## 2015-05-07 NOTE — ED Notes (Signed)
Pt with chronic neck pain but today pt went to left something and she heard a pop noise, pain to neck, right shoulder and right upper arm with limited ROM

## 2015-05-07 NOTE — ED Notes (Signed)
States she was picking up a case of tea when she herd a pop in right shoulder. Reports of increase pain when she tries to lift right arm. Limited ROM. Denies numbness although reports of tingling in right finger but is not new.

## 2015-05-07 NOTE — Discharge Instructions (Signed)
Muscle Strain A muscle strain is an injury that occurs when a muscle is stretched beyond its normal length. Usually a small number of muscle fibers are torn when this happens. Muscle strain is rated in degrees. First-degree strains have the least amount of muscle fiber tearing and pain. Second-degree and third-degree strains have increasingly more tearing and pain.  Usually, recovery from muscle strain takes 1-2 weeks. Complete healing takes 5-6 weeks.  CAUSES  Muscle strain happens when a sudden, violent force placed on a muscle stretches it too far. This may occur with lifting, sports, or a fall.  RISK FACTORS Muscle strain is especially common in athletes.  SIGNS AND SYMPTOMS At the site of the muscle strain, there may be:  Pain.  Bruising.  Swelling.  Difficulty using the muscle due to pain or lack of normal function. DIAGNOSIS  Your health care provider will perform a physical exam and ask about your medical history. TREATMENT  Often, the best treatment for a muscle strain is resting, icing, and applying cold compresses to the injured area.  HOME CARE INSTRUCTIONS   Use the PRICE method of treatment to promote muscle healing during the first 2-3 days after your injury. The PRICE method involves:  Protecting the muscle from being injured again.  Restricting your activity and resting the injured body part.  Icing your injury. To do this, put ice in a plastic bag. Place a towel between your skin and the bag. Then, apply the ice and leave it on from 15-20 minutes each hour. After the third day, switch to moist heat packs.  Apply compression to the injured area with a splint or elastic bandage. Be careful not to wrap it too tightly. This may interfere with blood circulation or increase swelling.  Elevate the injured body part above the level of your heart as often as you can.  Only take over-the-counter or prescription medicines for pain, discomfort, or fever as directed by your  health care provider.  Warming up prior to exercise helps to prevent future muscle strains. SEEK MEDICAL CARE IF:   You have increasing pain or swelling in the injured area.  You have numbness, tingling, or a significant loss of strength in the injured area. MAKE SURE YOU:   Understand these instructions.  Will watch your condition.  Will get help right away if you are not doing well or get worse. Document Released: 08/09/2005 Document Revised: 05/30/2013 Document Reviewed: 03/08/2013 [facility] Patient Information 2015 [place], [place]. This information is not intended to replace advice given to you by your health care provider. Make sure you discuss any questions you have with your health care provider.  Rest your arm and shoulder, avoid any activity that worsens your pain.  Apply ice frequently for the next several days.  Continue taking your home medications.  Followup with Dr. Aline Brochure if your symptoms persist beyond the next week, are not improving or for any worsening symptoms.  Your xrays today do not show any obvious acute injury but if your symptoms persist you may need additional testing.

## 2015-05-07 NOTE — ED Provider Notes (Signed)
The patient is a 69 year old female who presents with right lateral arm pain after feeling a pop when carrying a heavy flat of drinks. On exam she has tenderness in the proximal third of the humerus on the lateral surface, pain with forced external rotation against resistance, pain with forced abduction against resistance, normal sensation, normal strength at the hand, normal pulses.  Imaging evaluated, no fractures  Rice therapy, sling, home. Patient in agreement  Medical screening examination/treatment/procedure(s) were conducted as a shared visit with non-physician practitioner(s) and myself.  I personally evaluated the patient during the encounter.  Clinical Impression:   Final diagnoses:  Biceps strain, right, initial encounter         Noemi Chapel, MD 05/08/15 2159

## 2015-05-07 NOTE — ED Provider Notes (Signed)
CSN: [id number]     Arrival date & time 05/07/15  1408 History   First MD Initiated Contact with Patient 05/07/15 1429     Chief Complaint  Patient presents with  . Arm Pain     (Consider location/radiation/quality/duration/timing/severity/associated sxs/prior Treatment) Patient is a 69 y.o. female presenting with arm pain. The history is provided by the patient.  Arm Pain Associated symptoms include arthralgias. Pertinent negatives include no fever, joint swelling, myalgias, numbness or weakness.   [name] is a 69 y.o. 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  She has persistent pain worsened with movement, and intermittent tingling in her 2nd, 3rd and 4th fingers which is a chronic finding and not worsened since todays injury.  She denies weakness in her arm or hand.  She has taken her hydrocodone and a gabapentin prior to arrival today.    Past Medical History  Diagnosis Date  . Arthritis   . High blood pressure   . High cholesterol    Past Surgical History  Procedure Laterality Date  . Joint replacement    . Appendectomy    . Tonsilectomy, adenoidectomy, bilateral myringotomy and tubes    . Partial hysterectomy    . Knee arthroplasty Right   . Knee arthroplasty Left    History reviewed. No pertinent family history. Social History  Substance Use Topics  . Smoking status: Unknown If Ever Smoked  . Smokeless tobacco: None  . Alcohol Use: None   OB History    No data available     Review of Systems  Constitutional: Negative for fever.  Musculoskeletal: Positive for arthralgias. Negative for myalgias and joint swelling.  Neurological: Negative  for weakness and numbness.       Negative except as mentioned in HPI.         Allergies  Sulfonamide derivatives  Home Medications   Prior to Admission medications   Medication Sig Start Date End Date Taking? Authorizing Provider  BIOTIN PO Take by mouth.    Historical Provider, MD  bisoprolol-hydrochlorothiazide [facility]) 2.5-6.25 MG per tablet  07/28/11   Historical Provider, MD  buPROPion (WELLBUTRIN XL) 150 MG 24 hr tablet  08/19/11   Historical Provider, MD  celecoxib (CELEBREX) 100 MG capsule Take 1 capsule (100 mg total) by mouth 2 (two) times daily. 10/29/13   Carole Civil, MD  Cholecalciferol (VITAMIN D-3 PO) Take by mouth.    Historical Provider, MD  Coenzyme Q10 (COQ10) 200 MG CAPS Take by mouth.    Historical Provider, MD  cyclobenzaprine (FLEXERIL) 10 MG tablet Take 1 tablet (10 mg total) by mouth 3 (three) times daily as needed for muscle spasms. 06/20/13   Carole Civil, MD  gabapentin (NEURONTIN) 100 MG capsule Take 1 capsule (100 mg total) by mouth 2 (two) times daily. 02/11/15   Carole Civil, MD  HYDROcodone-acetaminophen ([place]) 7.5-325 MG per tablet Take 1 tablet by mouth every 4 (four) hours as needed for moderate pain. 02/11/15   Carole Civil, MD  LORazepam (ATIVAN) 0.5 MG tablet Take 1 tablet by mouth 2 (two) times daily. 03/26/15   Historical Provider, MD  Misc Natural Products ([place])  CAPS Take by mouth.    Historical Provider, MD  Omega-3 Fatty Acids (OMEGA 3 PO) Take by mouth.    Historical Provider, MD  pravastatin (PRAVACHOL) 40 MG tablet  07/28/11   Historical Provider, MD  TRAVATAN Z 0.004 % SOLN ophthalmic solution  08/28/11   Historical Provider, MD  triamterene-hydrochlorothiazide (MAXZIDE-25) 37.5-25 MG per tablet  07/28/11   Historical Provider, MD   BP 162/71 mmHg  Pulse 71  Temp(Src) 97.7 F (36.5 C) (Oral)  Resp 16  Ht 5\' 4"  (1.626 m)  Wt 160 lb (72.576 kg)  BMI 27.45 kg/m2  SpO2 97% Physical Exam  Constitutional:  She appears well-developed and well-nourished.  HENT:  Head: Atraumatic.  Neck: Normal range of motion.  Cardiovascular:  Pulses equal bilaterally  Musculoskeletal: She exhibits tenderness.       Right shoulder: She exhibits bony tenderness. She exhibits no swelling, no effusion, no crepitus, no deformity, no spasm, normal pulse and normal strength.  TTP right anterior proximal   Neurological: She is alert. She has normal strength. She displays normal reflexes. No sensory deficit.  Skin: Skin is warm and dry.  Psychiatric: She has a normal mood and affect.    ED Course  Procedures (including critical care time) Labs Review Labs Reviewed - No data to display  Imaging Review Dg Shoulder Right  05/07/2015   CLINICAL DATA:  Pain with limited range of motion. Pain began after lifting heavy object  EXAM: RIGHT SHOULDER - 2+ VIEW  COMPARISON:  Right shoulder MRI January 21, 2005  FINDINGS: Frontal, Y scapular, and axillary images obtained. There is postoperative change in humeral head region. There is moderate generalized osteoarthritis. No acute fracture or dislocation. No erosive change.  IMPRESSION: Moderate generalized osteoarthritic change. Postoperative change in the humeral head region. No fracture or dislocation.   Electronically Signed   By: Lowella Grip III M.D.   On: 05/07/2015 15:02   I have personally reviewed and evaluated these images and lab results as part of my medical decision-making.   EKG Interpretation None      MDM   Final diagnoses:  Biceps strain, right, initial encounter     Radiological studies were viewed, interpreted and considered during the medical decision making and disposition process. I agree with radiologists reading.  Results were also discussed with patient. Pt was also seen by Dr. Sabra Heck during this visit.  Sling provided. RICE,  F/u with Dr. Aline Brochure if sx persist, worsen or are not improving over the next week.     Evalee Jefferson, PA-C 05/07/15  1638  Noemi Chapel, MD 05/08/15 2159

## 2015-05-08 NOTE — Telephone Encounter (Signed)
Patient called to follow up on medication refill for Celebrex per her mail-in pharmacy, [place].  States she already has a scheduled appointment here 05/20/15; also relays that she went to [facility] Emergency room last evening (9/14;16) regarding problem of biceps strain.  Please advise patient; cell ph#[phone].

## 2015-05-09 NOTE — Telephone Encounter (Signed)
Okay to refill celebrex?

## 2015-05-20 ENCOUNTER — Ambulatory Visit (INDEPENDENT_AMBULATORY_CARE_PROVIDER_SITE_OTHER): Payer: Medicare Other | Admitting: Orthopedic Surgery

## 2015-05-20 VITALS — BP 136/70 | Ht 64.0 in | Wt 160.0 lb

## 2015-05-20 DIAGNOSIS — S46811A Strain of other muscles, fascia and tendons at shoulder and upper arm level, right arm, initial encounter: Secondary | ICD-10-CM | POA: Diagnosis not present

## 2015-05-20 DIAGNOSIS — M75101 Unspecified rotator cuff tear or rupture of right shoulder, not specified as traumatic: Secondary | ICD-10-CM | POA: Diagnosis not present

## 2015-05-20 DIAGNOSIS — S46011A Strain of muscle(s) and tendon(s) of the rotator cuff of right shoulder, initial encounter: Secondary | ICD-10-CM | POA: Diagnosis not present

## 2015-05-20 NOTE — Progress Notes (Signed)
Patient ID: [name], female   DOB: 1945/10/13, 69 y.o.   MRN: [id number]  Follow up visit  Chief Complaint  Patient presents with  . Follow-up    ER follow up on right shoulder     BP 136/70 mmHg  Ht 5\' 4"  (1.626 m)  Wt 160 lb (72.576 kg)  BMI 27.45 kg/m2  No diagnosis found.  Patient presents to be seen for her cervical spine spondylosis she did get treatment for that and has intermittent symptoms at this time.  However she was lifting and some objects while cleaning out her basement about 2 weeks ago felt intense pain in her right shoulder near the deltoid insertion and then couldn't lift her arm. She's had a shoulder reconstruction for dislocation many many years ago. She went to the ER x-rays show degenerative arthritis some proximal migration of the humeral head 3 implants near what was probably a subscapularis surgical intervention.  She was told she had a torn muscle. She does have pain weakness but improvement in her forward elevation. Her pain initially was quite intense in the motion loss was significant. However again she is recovering.  Review of systems negative for numbness or tingling except intermittently in the long and ring finger of the right hand. She denies any fever or erythema or bruising around the right arm.  She has stable vital signs appearance is normal she is going to 3 mood is pleasant  Gait and station are normal. Skin is intact over the right shoulder and arm  She has tenderness in the deltoid area and in the rotator cuff area. Her active range of motion is 140 of elevation. She stable in abduction external rotation. She has 5 out of 5 manual muscle testing of the supraspinatus. No nerve deficits neurovascular deficits and no lymph node enlargement.  X-rays were done at the hospital I interpreted those as surgical implants from previous surgery. Proximal migration humeral head without glenohumeral arthritis. Sclerosis undersurface acromion  and greater tuberosity perhaps from chronic irritation.  Impression strain rotator cuff or deltoid  Subsequent impingement  Procedure note the subacromial injection shoulder RIGHT  Verbal consent was obtained to inject the  RIGHT   Shoulder  Timeout was completed to confirm the injection site is a subacromial space of the  RIGHT  shoulder   Medication used Depo-Medrol 40 mg and lidocaine 1% 3 cc  Anesthesia was provided by ethyl chloride  The injection was performed in the RIGHT  posterior subacromial space. After pinning the skin with alcohol and anesthetized the skin with ethyl chloride the subacromial space was injected using a 20-gauge needle. There were no complications  Sterile dressing was applied.   Recommend subacromial injection, CODMAN exercises for 4 weeks in follow-up.

## 2015-06-17 ENCOUNTER — Ambulatory Visit (INDEPENDENT_AMBULATORY_CARE_PROVIDER_SITE_OTHER): Payer: Medicare Other | Admitting: Orthopedic Surgery

## 2015-06-17 ENCOUNTER — Encounter: Payer: Self-pay | Admitting: Orthopedic Surgery

## 2015-06-17 VITALS — BP 121/98 | Ht 64.0 in | Wt 160.0 lb

## 2015-06-17 DIAGNOSIS — M75101 Unspecified rotator cuff tear or rupture of right shoulder, not specified as traumatic: Secondary | ICD-10-CM | POA: Diagnosis not present

## 2015-06-17 NOTE — Progress Notes (Signed)
Patient ID: [name], female   DOB: [birth year], 69 y.o.   MRN: [id number]  Follow up visit  Chief Complaint  Patient presents with  . Follow-up    4 week follow up right shoulder s/p inj + home exercises    BP 121/98 mmHg  Ht 5\' 4"  (1.626 m)  Wt 160 lb (72.576 kg)  BMI 27.45 kg/m2   Patient presents to be seen for her cervical spine spondylosis she did get treatment for that and has intermittent symptoms at this time.  However she was lifting and some objects while cleaning out her basement about 2 weeks ago felt intense pain in her right shoulder near the deltoid insertion and then couldn't lift her arm. She's had a shoulder reconstruction for dislocation many many years ago. She went to the ER x-rays show degenerative arthritis some proximal migration of the humeral head 3 implants near what was probably a subscapularis surgical intervention.  She was told she had a torn muscle. She does have pain weakness but improvement in her forward elevation. Her pain initially was quite intense in the motion loss was significant. However again she is recovering.  Review of systems negative for numbness or tingling except intermittently in the long and ring finger of the right hand. She denies any fever or erythema or bruising around the right arm.  As far as the shoulder goes other than making some noise is much improved.  Exam shows full forward elevation with mild crepitance  No further treatment necessary return as needed

## 2015-08-29 DIAGNOSIS — S338XXA Sprain of other parts of lumbar spine and pelvis, initial encounter: Secondary | ICD-10-CM | POA: Diagnosis not present

## 2015-08-29 DIAGNOSIS — M9903 Segmental and somatic dysfunction of lumbar region: Secondary | ICD-10-CM | POA: Diagnosis not present

## 2015-09-03 DIAGNOSIS — M47816 Spondylosis without myelopathy or radiculopathy, lumbar region: Secondary | ICD-10-CM | POA: Diagnosis not present

## 2015-09-03 DIAGNOSIS — S338XXA Sprain of other parts of lumbar spine and pelvis, initial encounter: Secondary | ICD-10-CM | POA: Diagnosis not present

## 2015-09-03 DIAGNOSIS — M9903 Segmental and somatic dysfunction of lumbar region: Secondary | ICD-10-CM | POA: Diagnosis not present

## 2015-09-05 DIAGNOSIS — M47816 Spondylosis without myelopathy or radiculopathy, lumbar region: Secondary | ICD-10-CM | POA: Diagnosis not present

## 2015-09-05 DIAGNOSIS — M9903 Segmental and somatic dysfunction of lumbar region: Secondary | ICD-10-CM | POA: Diagnosis not present

## 2015-09-05 DIAGNOSIS — S338XXA Sprain of other parts of lumbar spine and pelvis, initial encounter: Secondary | ICD-10-CM | POA: Diagnosis not present

## 2015-09-08 DIAGNOSIS — M9903 Segmental and somatic dysfunction of lumbar region: Secondary | ICD-10-CM | POA: Diagnosis not present

## 2015-09-08 DIAGNOSIS — S338XXA Sprain of other parts of lumbar spine and pelvis, initial encounter: Secondary | ICD-10-CM | POA: Diagnosis not present

## 2015-09-08 DIAGNOSIS — M47816 Spondylosis without myelopathy or radiculopathy, lumbar region: Secondary | ICD-10-CM | POA: Diagnosis not present

## 2015-09-09 DIAGNOSIS — S338XXA Sprain of other parts of lumbar spine and pelvis, initial encounter: Secondary | ICD-10-CM | POA: Diagnosis not present

## 2015-09-09 DIAGNOSIS — M47816 Spondylosis without myelopathy or radiculopathy, lumbar region: Secondary | ICD-10-CM | POA: Diagnosis not present

## 2015-09-09 DIAGNOSIS — M9903 Segmental and somatic dysfunction of lumbar region: Secondary | ICD-10-CM | POA: Diagnosis not present

## 2015-09-09 DIAGNOSIS — M7060 Trochanteric bursitis, unspecified hip: Secondary | ICD-10-CM | POA: Diagnosis not present

## 2015-09-11 DIAGNOSIS — M47816 Spondylosis without myelopathy or radiculopathy, lumbar region: Secondary | ICD-10-CM | POA: Diagnosis not present

## 2015-09-11 DIAGNOSIS — M9903 Segmental and somatic dysfunction of lumbar region: Secondary | ICD-10-CM | POA: Diagnosis not present

## 2015-09-11 DIAGNOSIS — S338XXA Sprain of other parts of lumbar spine and pelvis, initial encounter: Secondary | ICD-10-CM | POA: Diagnosis not present

## 2015-09-11 DIAGNOSIS — M7060 Trochanteric bursitis, unspecified hip: Secondary | ICD-10-CM | POA: Diagnosis not present

## 2015-09-17 DIAGNOSIS — G8929 Other chronic pain: Secondary | ICD-10-CM | POA: Diagnosis not present

## 2015-09-17 DIAGNOSIS — Z789 Other specified health status: Secondary | ICD-10-CM | POA: Diagnosis not present

## 2015-09-17 DIAGNOSIS — I10 Essential (primary) hypertension: Secondary | ICD-10-CM | POA: Diagnosis not present

## 2015-09-17 DIAGNOSIS — M25559 Pain in unspecified hip: Secondary | ICD-10-CM | POA: Diagnosis not present

## 2015-09-17 DIAGNOSIS — Z418 Encounter for other procedures for purposes other than remedying health state: Secondary | ICD-10-CM | POA: Diagnosis not present

## 2015-09-30 DIAGNOSIS — H401131 Primary open-angle glaucoma, bilateral, mild stage: Secondary | ICD-10-CM | POA: Diagnosis not present

## 2015-10-13 ENCOUNTER — Ambulatory Visit (INDEPENDENT_AMBULATORY_CARE_PROVIDER_SITE_OTHER): Payer: Medicare Other | Admitting: Orthopedic Surgery

## 2015-10-13 ENCOUNTER — Encounter: Payer: Self-pay | Admitting: Orthopedic Surgery

## 2015-10-13 ENCOUNTER — Ambulatory Visit (INDEPENDENT_AMBULATORY_CARE_PROVIDER_SITE_OTHER): Payer: Medicare Other

## 2015-10-13 VITALS — BP 134/83 | Ht 64.0 in | Wt 170.0 lb

## 2015-10-13 DIAGNOSIS — M5441 Lumbago with sciatica, right side: Secondary | ICD-10-CM

## 2015-10-13 DIAGNOSIS — M4806 Spinal stenosis, lumbar region: Secondary | ICD-10-CM | POA: Diagnosis not present

## 2015-10-13 DIAGNOSIS — M48061 Spinal stenosis, lumbar region without neurogenic claudication: Secondary | ICD-10-CM

## 2015-10-13 MED ORDER — CYCLOBENZAPRINE HCL 10 MG PO TABS
10.0000 mg | ORAL_TABLET | Freq: Three times a day (TID) | ORAL | Status: DC | PRN
Start: 2015-10-13 — End: 2019-08-30

## 2015-10-13 MED ORDER — HYDROCODONE-ACETAMINOPHEN 7.5-325 MG PO TABS: 1.0000 | ORAL_TABLET | ORAL | Status: DC | PRN

## 2015-10-13 NOTE — Progress Notes (Signed)
Patient ID: [name], female   DOB: [birth year], 70 y.o.   MRN: [id number]  Chief Complaint  Patient presents with  . Back Pain    LOW BACK PAIN RADIATES DOWN RIGHT LEG    HPI [name] is a 70 y.o. female.  Presents for evaluation of lower back and right leg radicular pain  The patient has had a history of lumbar spine spondylosis had MRI and L-spine films and was treated with chiropractic manipulation  Since November December she's had pain in her lower back radiating to her right leg. She's had physical therapy 7 trips to the chiropractor and IM steroid shot 6 days a Dosepak Celebrex hydrocodone gabapentin and cyclobenzaprine and has not gotten any relief  Self-reported symptoms include numbness and tingling in the right leg with dull aching pain on the right lower back and right leg which is become constant and is 10 out of 10. Is increased with walking or sitting for long periods of time.  Review of Systems Review of Systems  Musculoskeletal: Positive for back pain.  Neurological: Positive for numbness.  All other systems reviewed and are negative.   Past Medical History  Diagnosis Date  . Arthritis   . High blood pressure   . High cholesterol     Past Surgical History  Procedure Laterality Date  . Joint replacement    . Appendectomy    . Tonsilectomy, adenoidectomy, bilateral myringotomy and tubes    . Partial hysterectomy    . Knee arthroplasty Right   . Knee arthroplasty Left     No family history on file.  Social History Social History  Substance Use Topics  . Smoking status: Unknown If Ever Smoked  . Smokeless tobacco: None  . Alcohol Use: None    Allergies  Allergen Reactions  . Sulfonamide Derivatives     Current Outpatient Prescriptions  Medication Sig Dispense Refill  . celecoxib (CELEBREX) 100 MG capsule TAKE 1 CAPSULE TWICE DAILY 180 capsule 3  . Cholecalciferol (VITAMIN D-3 PO) Take by mouth.    Marland Kitchen CINNAMON PO Take by  mouth.    . Coenzyme Q10 (COQ10) 200 MG CAPS Take by mouth.    . cyanocobalamin 500 MCG tablet Take 500 mcg by mouth daily.    . cyclobenzaprine (FLEXERIL) 10 MG tablet 10 mg.    . gabapentin (NEURONTIN) 100 MG capsule Take 1 capsule (100 mg total) by mouth 2 (two) times daily. 60 capsule 4  . HYDROcodone-acetaminophen (NORCO) 7.5-325 MG per tablet Take 1 tablet by mouth every 4 (four) hours as needed for moderate pain. 60 tablet 0  . Misc Natural Products (TURMERIC CURCUMIN) CAPS Take by mouth.    . Omega-3 Fatty Acids (OMEGA 3 PO) Take by mouth.    . TRAVATAN Z 0.004 % SOLN ophthalmic solution     . triamterene-hydrochlorothiazide (MAXZIDE-25) 37.5-25 MG per tablet      No current facility-administered medications for this visit.       Physical Exam Physical Exam Blood pressure 134/83, height 5\' 4"  (1.626 m), weight 170 lb (77.111 kg). Appearance, there are no abnormalities in terms of appearance the patient was well-developed and well-nourished. The grooming and hygiene were normal.  Mental status orientation, there was normal alertness and orientation Mood pleasant Ambulatory status normal with no assistive devices  Examination of the lumbar spine reveal tenderness at the L4-5 disc space and also on the right  gluteal area  and lower back.  Decreased spinal flexion extension and  rotation were noted.  The lower extremities exhibited normal dorsiflexion plantar flexion extension flexion of the knee as well as hip flexion. No atrophy was noted. The skin overlying the lumbar thoracic and cervical regions was warm dry and intact without laceration. The lower extremities also had normal skin.  Neurologic examination  Reflexes were 2+ and equal in the knee area  Sensation was normal in both feet and legs  Babinski's tests were down going  Straight leg raise testing was normal on the left but positive on the right at 60  The vascular examination revealed normal dorsalis pedis  pulses in both feet and both feet were warm with good capillary refill   Data Reviewed Today's L-spine film shows excessive spondylosis and degenerative scoliosis. When compared to the previous film we also note similar radiographic findings. She had an MRI in 2014  I reviewed that report and included in this dictation   IMPRESSION:   1.  There is multilevel spondylosis with disc degeneration, annular bulging and paraspinal osteophyte formation as described.  These factors contribute to mild to moderate central stenosis from L2-L3 through L4-L5. 2.  No high-grade foraminal stenosis is demonstrated on the right. There is mild right-sided foraminal narrowing at L2-L3 and L5-S1. The greatest foraminal narrowing appears to be on the left at L4- L5. 3.  No definite acute findings.     Original Report Authenticated By: Richardean Sale, M.D.    Assessment  Worsening lower back pain with radicular symptoms unrelieved by nonoperative measures including medication and oral steroids and NSAIDs as well as therapy and chiropractic manipulation   Plan  MRI lumbar spine. Probably will need epidural injections Refill of cyclobenzaprine and hydrocodone continue the other 2 medications follow-up after MRI

## 2015-10-13 NOTE — Patient Instructions (Signed)
WE WILL SCHEDULE MRI AND CALL YOU WITH APPOINTMENT 

## 2015-10-14 ENCOUNTER — Telehealth: Payer: Self-pay | Admitting: *Deleted

## 2015-10-14 NOTE — Telephone Encounter (Signed)
CALLED [employer]- NO AUTH REQUIRED PER CORY B  MRI SCHEDULED 10/24/15 10:45AM @ [facility]  FOLLOW UP IN OFFICE 10/31/15 8:40AM  PATIENT AWARE OF DATES

## 2015-10-24 ENCOUNTER — Ambulatory Visit (HOSPITAL_COMMUNITY)
Admission: RE | Admit: 2015-10-24 | Discharge: 2015-10-24 | Disposition: A | Discharge status: Home / Self Care | Payer: Medicare Other | Source: Ambulatory Visit | Attending: Orthopedic Surgery | Admitting: Orthopedic Surgery

## 2015-10-24 DIAGNOSIS — M47897 Other spondylosis, lumbosacral region: Secondary | ICD-10-CM | POA: Diagnosis not present

## 2015-10-24 DIAGNOSIS — M4806 Spinal stenosis, lumbar region: Secondary | ICD-10-CM | POA: Insufficient documentation

## 2015-10-24 DIAGNOSIS — M48061 Spinal stenosis, lumbar region without neurogenic claudication: Secondary | ICD-10-CM

## 2015-10-24 DIAGNOSIS — M5116 Intervertebral disc disorders with radiculopathy, lumbar region: Secondary | ICD-10-CM | POA: Diagnosis not present

## 2015-10-24 DIAGNOSIS — M4807 Spinal stenosis, lumbosacral region: Secondary | ICD-10-CM | POA: Insufficient documentation

## 2015-10-28 DIAGNOSIS — S9030XA Contusion of unspecified foot, initial encounter: Secondary | ICD-10-CM | POA: Diagnosis not present

## 2015-10-28 DIAGNOSIS — M19071 Primary osteoarthritis, right ankle and foot: Secondary | ICD-10-CM | POA: Diagnosis not present

## 2015-10-28 DIAGNOSIS — R609 Edema, unspecified: Secondary | ICD-10-CM | POA: Diagnosis not present

## 2015-10-30 NOTE — Progress Notes (Signed)
Patient ID: [name], female   DOB: December 06, 1945, 70 y.o.   MRN: [id number] Chief Complaint  Patient presents with  . Follow-up    review MRI L spine    HPI [name] is a 70 y.o. female. Presents for evaluation of lower back and right leg radicular pain  The patient has had a history of lumbar spine spondylosis had MRI and L-spine films and was treated with chiropractic manipulation  Since November December she's had pain in her lower back radiating to her right leg. She's had physical therapy 7 trips to the chiropractor and IM steroid shot 6 days a Dosepak Celebrex hydrocodone gabapentin and cyclobenzaprine and has not gotten any relief  Self-reported symptoms include numbness and tingling in the right leg with dull aching pain on the right lower back and right leg which is become constant and is 10 out of 10. Is increased with walking or sitting for long periods of time.  Review of Systems Review of Systems  Musculoskeletal: Positive for back pain.  Neurological: Positive for numbness.  All other systems reviewed and are negative.  Physical Exam      She reports no improvement. We got an MRI of her spine and she has multilevel disc disease with spinal stenosis  This is the MRI that has been reviewed with its report IMPRESSION: Moderately severe central canal stenosis L2-3 where there is also narrowing in the right lateral recess and mild to moderate right foraminal narrowing.   Moderately severe central canal stenosis L3-4 where there is moderately severe left and mild right foraminal narrowing.   Moderate central canal stenosis L4-5 where there is severe left and moderately severe right foraminal narrowing.   Narrowing in the right lateral recess at L5-S1 due to facet degenerative change and disc could irritate the descending right S1 root. Moderately severe bilateral foraminal narrowing is seen at this level.     Electronically Signed   By:  Inge Rise M.D.   On: 10/24/2015 12:35     Discussed the findings with her and we decided to get a neurosurgical consult.  Refill gabapentin

## 2015-10-31 ENCOUNTER — Encounter: Payer: Self-pay | Admitting: Orthopedic Surgery

## 2015-10-31 ENCOUNTER — Ambulatory Visit (INDEPENDENT_AMBULATORY_CARE_PROVIDER_SITE_OTHER): Payer: Medicare Other | Admitting: Orthopedic Surgery

## 2015-10-31 VITALS — BP 148/79 | Ht 63.0 in | Wt 165.0 lb

## 2015-10-31 DIAGNOSIS — M48061 Spinal stenosis, lumbar region without neurogenic claudication: Secondary | ICD-10-CM

## 2015-10-31 DIAGNOSIS — M4806 Spinal stenosis, lumbar region: Secondary | ICD-10-CM

## 2015-10-31 DIAGNOSIS — M5430 Sciatica, unspecified side: Secondary | ICD-10-CM | POA: Diagnosis not present

## 2015-10-31 MED ORDER — GABAPENTIN 100 MG PO CAPS: 100.0000 mg | ORAL_CAPSULE | Freq: Two times a day (BID) | ORAL | Status: DC

## 2015-10-31 NOTE — Patient Instructions (Signed)
We will refer you to [facility] 

## 2015-11-03 ENCOUNTER — Telehealth: Payer: Self-pay | Admitting: *Deleted

## 2015-11-03 NOTE — Telephone Encounter (Signed)
Referral and office notes faxed to [facility]. Awaiting appointment.

## 2015-11-10 ENCOUNTER — Other Ambulatory Visit: Payer: Self-pay | Admitting: Neurosurgery

## 2015-11-10 DIAGNOSIS — Z6831 Body mass index (BMI) 31.0-31.9, adult: Secondary | ICD-10-CM | POA: Diagnosis not present

## 2015-11-10 DIAGNOSIS — M4806 Spinal stenosis, lumbar region: Secondary | ICD-10-CM | POA: Diagnosis not present

## 2015-11-10 DIAGNOSIS — I10 Essential (primary) hypertension: Secondary | ICD-10-CM | POA: Diagnosis not present

## 2015-11-20 ENCOUNTER — Encounter (HOSPITAL_COMMUNITY)
Admission: RE | Admit: 2015-11-20 | Discharge: 2015-11-20 | Disposition: A | Discharge status: Home / Self Care | Payer: Medicare Other | Source: Ambulatory Visit | Attending: Neurosurgery | Admitting: Neurosurgery

## 2015-11-20 ENCOUNTER — Encounter (HOSPITAL_COMMUNITY): Payer: Self-pay

## 2015-11-20 DIAGNOSIS — I10 Essential (primary) hypertension: Secondary | ICD-10-CM | POA: Diagnosis not present

## 2015-11-20 DIAGNOSIS — R001 Bradycardia, unspecified: Secondary | ICD-10-CM | POA: Insufficient documentation

## 2015-11-20 DIAGNOSIS — Z01812 Encounter for preprocedural laboratory examination: Secondary | ICD-10-CM | POA: Diagnosis not present

## 2015-11-20 DIAGNOSIS — Z01818 Encounter for other preprocedural examination: Secondary | ICD-10-CM | POA: Diagnosis not present

## 2015-11-20 DIAGNOSIS — M4806 Spinal stenosis, lumbar region: Secondary | ICD-10-CM | POA: Insufficient documentation

## 2015-11-20 HISTORY — DX: Anxiety disorder, unspecified: F41.9

## 2015-11-20 HISTORY — DX: Unspecified glaucoma: H40.9

## 2015-11-20 LAB — BASIC METABOLIC PANEL
Anion gap: 8 (ref 5–15)
BUN: 12 mg/dL (ref 6–20)
CHLORIDE: 106 mmol/L (ref 101–111)
CO2: 27 mmol/L (ref 22–32)
Calcium: 10.1 mg/dL (ref 8.9–10.3)
Creatinine, Ser: 0.77 mg/dL (ref 0.44–1.00)
GFR calc Af Amer: >60 mL/min (ref >60)
GLUCOSE: 89 mg/dL (ref 65–99)
POTASSIUM: 3.8 mmol/L (ref 3.5–5.1)
Sodium: 141 mmol/L (ref 135–145)

## 2015-11-20 LAB — CBC
HEMATOCRIT: 42.2 % (ref 36.0–46.0)
Hemoglobin: 14.6 g/dL (ref 12.0–15.0)
MCH: 31.7 pg (ref 26.0–34.0)
MCHC: 34.6 g/dL (ref 30.0–36.0)
MCV: 91.5 fL (ref 78.0–100.0)
Platelets: 214 10*3/uL (ref 150–400)
RBC: 4.61 MIL/uL (ref 3.87–5.11)
RDW: 13 % (ref 11.5–15.5)
WBC: 7.2 10*3/uL (ref 4.0–10.5)

## 2015-11-20 LAB — SURGICAL PCR SCREEN
MRSA, PCR: NEGATIVE
STAPHYLOCOCCUS AUREUS: POSITIVE — AB

## 2015-11-20 NOTE — Progress Notes (Signed)
Has not seen cardiologist in "many years".  Had a stress test and echocardiogram >5 years ago which were normal done at [facility]  Hx hypertension but has not taken medication since weight loss   PCP: [facility] Dr Brigitte Pulse last seen approx 6 weeks ago

## 2015-11-20 NOTE — Pre-Procedure Instructions (Addendum)
    [name]  11/20/2015      [facility] - [place], [place] [address] Phone: [phone] Fax: [phone]  [facility] [place], [place] [address] Phone: [phone] Fax: [phone]    Your procedure is scheduled on Friday April 7th  Report to [facility] Admitting at 9:45am.  Call this number if you have problems the morning of surgery:  [phone]  Any questions prior to day of surgery, please call [phone] between 8-4:30   Remember:  Do not eat food or drink liquids after midnight.  Take these medicines the morning of surgery with A SIP OF WATER: Wellbutrin, Flexeril if needed, gabapentin (Neurontin) if needed, Hydrocodone if needed, Ativan if needed. Stop taking all Aspirn and Aspirin containing products, vitamins and supplements, herbal medications, and Nsaids such as Ibuprofen, Aleve, Advil and Motrin (including Celebrex)   Do not wear jewelry, make-up or nail polish.  Do not wear lotions, powders, or perfumes.  You may not wear deodorant.  Do not shave 48 hours prior to surgery.   Do not bring valuables to the hospital.  [facility] is not responsible for any belongings or valuables.  Contacts, dentures or bridgework may not be worn into surgery.  Leave your suitcase in the car.  After surgery it may be brought to your room.  For patients admitted to the hospital, discharge time will be determined by your treatment team.  Patients discharged the day of surgery will not be allowed to drive home.   Special instructions:  Shower with CHG as instructed  Please read over the following fact sheets that you were given. Pain Booklet, Coughing and Deep Breathing, MRSA Information and Surgical Site Infection Prevention

## 2015-11-20 NOTE — Progress Notes (Signed)
Mupirocin ointment called to [facility] in [place].  Patient called and instructed to start Mupirocin as directed 2 times per day for 5 days with stated understanding.

## 2015-11-27 MED ORDER — LACTATED RINGERS IV SOLN
INTRAVENOUS | Status: DC
  Administered 2015-11-28 (×3): via INTRAVENOUS

## 2015-11-27 MED ORDER — CEFAZOLIN SODIUM-DEXTROSE 2-4 GM/100ML-% IV SOLN
2.0000 g | INTRAVENOUS | Status: AC
  Administered 2015-11-28: 2 g via INTRAVENOUS

## 2015-11-28 ENCOUNTER — Encounter (HOSPITAL_COMMUNITY)
Admission: RE | Disposition: A | Discharge status: Home Health | Payer: Self-pay | Source: Ambulatory Visit | Attending: Neurosurgery

## 2015-11-28 ENCOUNTER — Ambulatory Visit (HOSPITAL_COMMUNITY): Payer: Medicare Other | Admitting: Anesthesiology

## 2015-11-28 ENCOUNTER — Inpatient Hospital Stay (HOSPITAL_COMMUNITY)
Admission: RE | Admit: 2015-11-28 | Discharge: 2015-11-30 | DRG: 520 | Disposition: A | Discharge status: Home Health | Payer: Medicare Other | Source: Ambulatory Visit | Attending: Neurosurgery | Admitting: Neurosurgery

## 2015-11-28 ENCOUNTER — Ambulatory Visit (HOSPITAL_COMMUNITY): Payer: Medicare Other

## 2015-11-28 DIAGNOSIS — M545 Low back pain: Secondary | ICD-10-CM | POA: Diagnosis not present

## 2015-11-28 DIAGNOSIS — Z79899 Other long term (current) drug therapy: Secondary | ICD-10-CM

## 2015-11-28 DIAGNOSIS — Z882 Allergy status to sulfonamides status: Secondary | ICD-10-CM | POA: Diagnosis not present

## 2015-11-28 DIAGNOSIS — Z419 Encounter for procedure for purposes other than remedying health state, unspecified: Secondary | ICD-10-CM

## 2015-11-28 DIAGNOSIS — M961 Postlaminectomy syndrome, not elsewhere classified: Secondary | ICD-10-CM | POA: Diagnosis not present

## 2015-11-28 DIAGNOSIS — Z96653 Presence of artificial knee joint, bilateral: Secondary | ICD-10-CM | POA: Diagnosis present

## 2015-11-28 DIAGNOSIS — M48062 Spinal stenosis, lumbar region with neurogenic claudication: Secondary | ICD-10-CM | POA: Diagnosis present

## 2015-11-28 DIAGNOSIS — I10 Essential (primary) hypertension: Secondary | ICD-10-CM | POA: Diagnosis present

## 2015-11-28 DIAGNOSIS — M4806 Spinal stenosis, lumbar region: Principal | ICD-10-CM | POA: Diagnosis present

## 2015-11-28 DIAGNOSIS — M1990 Unspecified osteoarthritis, unspecified site: Secondary | ICD-10-CM | POA: Diagnosis not present

## 2015-11-28 DIAGNOSIS — F419 Anxiety disorder, unspecified: Secondary | ICD-10-CM | POA: Diagnosis present

## 2015-11-28 HISTORY — PX: LUMBAR LAMINECTOMY/DECOMPRESSION MICRODISCECTOMY: SHX5026

## 2015-11-28 SURGERY — LUMBAR LAMINECTOMY/DECOMPRESSION MICRODISCECTOMY 2 LEVELS
Anesthesia: General

## 2015-11-28 MED ORDER — CELECOXIB 100 MG PO CAPS: 100.0000 mg | ORAL_CAPSULE | Freq: Two times a day (BID) | ORAL | Status: DC

## 2015-11-28 MED ORDER — ROCURONIUM BROMIDE 50 MG/5ML IV SOLN
INTRAVENOUS | Status: AC
  Filled 2015-11-28: qty 1

## 2015-11-28 MED ORDER — HEMOSTATIC AGENTS (NO CHARGE) OPTIME
TOPICAL | Status: DC | PRN
  Administered 2015-11-28: 1 via TOPICAL

## 2015-11-28 MED ORDER — CEFAZOLIN SODIUM-DEXTROSE 2-4 GM/100ML-% IV SOLN
INTRAVENOUS | Status: AC
  Filled 2015-11-28: qty 100

## 2015-11-28 MED ORDER — EPHEDRINE SULFATE 50 MG/ML IJ SOLN
INTRAMUSCULAR | Status: DC | PRN
  Administered 2015-11-28: 10 mg via INTRAVENOUS

## 2015-11-28 MED ORDER — THROMBIN 5000 UNITS EX SOLR
CUTANEOUS | Status: DC | PRN
  Administered 2015-11-28 (×2): 5000 [IU] via TOPICAL

## 2015-11-28 MED ORDER — PROPOFOL 10 MG/ML IV BOLUS
INTRAVENOUS | Status: DC | PRN
  Administered 2015-11-28: 120 mg via INTRAVENOUS

## 2015-11-28 MED ORDER — LIDOCAINE HCL (CARDIAC) 20 MG/ML IV SOLN
INTRAVENOUS | Status: AC
  Filled 2015-11-28: qty 5

## 2015-11-28 MED ORDER — DIAZEPAM 5 MG PO TABS
5.0000 mg | ORAL_TABLET | Freq: Four times a day (QID) | ORAL | Status: DC | PRN
  Administered 2015-11-28 – 2015-11-29 (×2): 5 mg via ORAL
  Filled 2015-11-28 (×2): qty 1

## 2015-11-28 MED ORDER — MENTHOL 3 MG MT LOZG: 1.0000 | LOZENGE | OROMUCOSAL | Status: DC | PRN

## 2015-11-28 MED ORDER — GABAPENTIN 100 MG PO CAPS: 100.0000 mg | ORAL_CAPSULE | Freq: Three times a day (TID) | ORAL | Status: DC | PRN

## 2015-11-28 MED ORDER — MIDAZOLAM HCL 2 MG/2ML IJ SOLN
INTRAMUSCULAR | Status: AC
  Filled 2015-11-28: qty 2

## 2015-11-28 MED ORDER — SODIUM CHLORIDE 0.9 % IV SOLN
250.0000 mL | INTRAVENOUS | Status: DC
Start: 2015-11-28 — End: 2015-11-30

## 2015-11-28 MED ORDER — FENTANYL CITRATE (PF) 250 MCG/5ML IJ SOLN
INTRAMUSCULAR | Status: AC
  Filled 2015-11-28: qty 5

## 2015-11-28 MED ORDER — LIDOCAINE HCL (CARDIAC) 20 MG/ML IV SOLN
INTRAVENOUS | Status: DC | PRN
  Administered 2015-11-28: 70 mg via INTRAVENOUS

## 2015-11-28 MED ORDER — ACETAMINOPHEN 325 MG PO TABS: 650.0000 mg | ORAL_TABLET | ORAL | Status: DC | PRN

## 2015-11-28 MED ORDER — TRIAMTERENE-HCTZ 37.5-25 MG PO TABS: 1.0000 | ORAL_TABLET | Freq: Every day | ORAL | Status: DC | PRN

## 2015-11-28 MED ORDER — SODIUM CHLORIDE 0.9% FLUSH
3.0000 mL | Freq: Two times a day (BID) | INTRAVENOUS | Status: DC
  Administered 2015-11-29 (×2): 3 mL via INTRAVENOUS

## 2015-11-28 MED ORDER — BUPROPION HCL ER (XL) 150 MG PO TB24
150.0000 mg | ORAL_TABLET | Freq: Every day | ORAL | Status: DC
  Administered 2015-11-28 – 2015-11-30 (×3): 150 mg via ORAL
  Filled 2015-11-28 (×3): qty 1

## 2015-11-28 MED ORDER — SUGAMMADEX SODIUM 200 MG/2ML IV SOLN
INTRAVENOUS | Status: AC
  Filled 2015-11-28: qty 2

## 2015-11-28 MED ORDER — VITAMIN D 1000 UNITS PO TABS
2500.0000 [IU] | ORAL_TABLET | Freq: Every day | ORAL | Status: DC
  Administered 2015-11-29 – 2015-11-30 (×2): 2500 [IU] via ORAL
  Filled 2015-11-28 (×2): qty 3

## 2015-11-28 MED ORDER — MAGNESIUM CITRATE PO SOLN: 1.0000 | Freq: Once | ORAL | Status: DC | PRN

## 2015-11-28 MED ORDER — MIDAZOLAM HCL 5 MG/5ML IJ SOLN
INTRAMUSCULAR | Status: DC | PRN
  Administered 2015-11-28: 2 mg via INTRAVENOUS

## 2015-11-28 MED ORDER — POTASSIUM CHLORIDE IN NACL 20-0.9 MEQ/L-% IV SOLN
INTRAVENOUS | Status: DC
  Administered 2015-11-28: 18:00:00 via INTRAVENOUS

## 2015-11-28 MED ORDER — 0.9 % SODIUM CHLORIDE (POUR BTL) OPTIME
TOPICAL | Status: DC | PRN
  Administered 2015-11-28: 1000 mL

## 2015-11-28 MED ORDER — OMEGA 3 1000 MG PO CAPS: 2000.0000 mg | ORAL_CAPSULE | Freq: Every day | ORAL | Status: DC

## 2015-11-28 MED ORDER — SUGAMMADEX SODIUM 200 MG/2ML IV SOLN
INTRAVENOUS | Status: DC | PRN
  Administered 2015-11-28: 160 mg via INTRAVENOUS

## 2015-11-28 MED ORDER — RESVERATROL 100 MG PO CAPS: 100.0000 mg | ORAL_CAPSULE | Freq: Every day | ORAL | Status: DC

## 2015-11-28 MED ORDER — PROPOFOL 10 MG/ML IV BOLUS
INTRAVENOUS | Status: AC
  Filled 2015-11-28: qty 20

## 2015-11-28 MED ORDER — ONDANSETRON HCL 4 MG/2ML IJ SOLN
INTRAMUSCULAR | Status: DC | PRN
  Administered 2015-11-28: 4 mg via INTRAVENOUS

## 2015-11-28 MED ORDER — CELECOXIB 100 MG PO CAPS
100.0000 mg | ORAL_CAPSULE | Freq: Two times a day (BID) | ORAL | Status: DC
  Filled 2015-11-28: qty 1

## 2015-11-28 MED ORDER — HYDROMORPHONE HCL 1 MG/ML IJ SOLN
INTRAMUSCULAR | Status: AC
  Filled 2015-11-28: qty 1

## 2015-11-28 MED ORDER — PROPYLENE GLYCOL 0.6 % OP SOLN: 1.0000 [drp] | Freq: Every day | OPHTHALMIC | Status: DC | PRN

## 2015-11-28 MED ORDER — PHENYLEPHRINE HCL 10 MG/ML IJ SOLN
INTRAMUSCULAR | Status: DC | PRN
  Administered 2015-11-28 (×2): 80 ug via INTRAVENOUS

## 2015-11-28 MED ORDER — SODIUM CHLORIDE 0.9% FLUSH: 3.0000 mL | INTRAVENOUS | Status: DC | PRN

## 2015-11-28 MED ORDER — ONDANSETRON HCL 4 MG/2ML IJ SOLN
4.0000 mg | INTRAMUSCULAR | Status: DC | PRN
Start: 2015-11-28 — End: 2015-11-30
  Administered 2015-11-28 – 2015-11-29 (×2): 4 mg via INTRAVENOUS
  Filled 2015-11-28 (×2): qty 2

## 2015-11-28 MED ORDER — OXYCODONE HCL 5 MG/5ML PO SOLN: 5.0000 mg | Freq: Once | ORAL | Status: AC | PRN

## 2015-11-28 MED ORDER — ONDANSETRON HCL 4 MG/2ML IJ SOLN
INTRAMUSCULAR | Status: AC
  Filled 2015-11-28: qty 2

## 2015-11-28 MED ORDER — HYDROMORPHONE HCL 1 MG/ML IJ SOLN
0.2500 mg | INTRAMUSCULAR | Status: DC | PRN
  Administered 2015-11-28 (×3): 0.5 mg via INTRAVENOUS
  Administered 2015-11-28 (×2): 0.25 mg via INTRAVENOUS

## 2015-11-28 MED ORDER — OMEGA-3-ACID ETHYL ESTERS 1 G PO CAPS
2.0000 g | ORAL_CAPSULE | Freq: Every day | ORAL | Status: DC
  Administered 2015-11-29 – 2015-11-30 (×2): 2 g via ORAL
  Filled 2015-11-28 (×2): qty 2

## 2015-11-28 MED ORDER — PHENOL 1.4 % MT LIQD
1.0000 | OROMUCOSAL | Status: DC | PRN
Start: 2015-11-28 — End: 2015-11-30

## 2015-11-28 MED ORDER — DEXAMETHASONE SODIUM PHOSPHATE 4 MG/ML IJ SOLN
INTRAMUSCULAR | Status: AC
  Filled 2015-11-28: qty 2

## 2015-11-28 MED ORDER — KETOROLAC TROMETHAMINE 30 MG/ML IJ SOLN
30.0000 mg | Freq: Four times a day (QID) | INTRAMUSCULAR | Status: DC
  Administered 2015-11-28 – 2015-11-30 (×7): 30 mg via INTRAVENOUS
  Filled 2015-11-28 (×7): qty 1

## 2015-11-28 MED ORDER — COQ10 200 MG PO CAPS: 200.0000 mg | ORAL_CAPSULE | Freq: Every day | ORAL | Status: DC

## 2015-11-28 MED ORDER — SENNOSIDES-DOCUSATE SODIUM 8.6-50 MG PO TABS: 1.0000 | ORAL_TABLET | Freq: Every evening | ORAL | Status: DC | PRN

## 2015-11-28 MED ORDER — VITAMIN B-12 100 MCG PO TABS
500.0000 ug | ORAL_TABLET | Freq: Every day | ORAL | Status: DC
  Administered 2015-11-29 – 2015-11-30 (×2): 500 ug via ORAL
  Filled 2015-11-28 (×2): qty 5

## 2015-11-28 MED ORDER — PHENYLEPHRINE 40 MCG/ML (10ML) SYRINGE FOR IV PUSH (FOR BLOOD PRESSURE SUPPORT)
PREFILLED_SYRINGE | INTRAVENOUS | Status: AC
  Filled 2015-11-28: qty 10

## 2015-11-28 MED ORDER — OXYCODONE HCL 5 MG PO TABS
ORAL_TABLET | ORAL | Status: AC
  Filled 2015-11-28: qty 1

## 2015-11-28 MED ORDER — OXYCODONE HCL 5 MG PO TABS
5.0000 mg | ORAL_TABLET | Freq: Once | ORAL | Status: AC | PRN
  Administered 2015-11-28: 5 mg via ORAL

## 2015-11-28 MED ORDER — LIDOCAINE-EPINEPHRINE 0.5 %-1:200000 IJ SOLN
INTRAMUSCULAR | Status: DC | PRN
  Administered 2015-11-28: 20 mL
  Administered 2015-11-28: 10 mL

## 2015-11-28 MED ORDER — FENTANYL CITRATE (PF) 100 MCG/2ML IJ SOLN
INTRAMUSCULAR | Status: DC | PRN
  Administered 2015-11-28: 50 ug via INTRAVENOUS
  Administered 2015-11-28: 100 ug via INTRAVENOUS
  Administered 2015-11-28 (×2): 50 ug via INTRAVENOUS

## 2015-11-28 MED ORDER — MORPHINE SULFATE (PF) 2 MG/ML IV SOLN
1.0000 mg | INTRAVENOUS | Status: DC | PRN
  Administered 2015-11-29: 4 mg via INTRAVENOUS
  Filled 2015-11-28: qty 2
  Filled 2015-11-28: qty 1
  Filled 2015-11-28: qty 2

## 2015-11-28 MED ORDER — ASTAXANTHIN 4 MG PO CAPS: 4.0000 mg | ORAL_CAPSULE | Freq: Every day | ORAL | Status: DC

## 2015-11-28 MED ORDER — POLYVINYL ALCOHOL 1.4 % OP SOLN
1.0000 [drp] | OPHTHALMIC | Status: DC | PRN
  Filled 2015-11-28: qty 15

## 2015-11-28 MED ORDER — DEXAMETHASONE SODIUM PHOSPHATE 4 MG/ML IJ SOLN
INTRAMUSCULAR | Status: DC | PRN
  Administered 2015-11-28: 8 mg via INTRAVENOUS

## 2015-11-28 MED ORDER — ROCURONIUM BROMIDE 100 MG/10ML IV SOLN
INTRAVENOUS | Status: DC | PRN
  Administered 2015-11-28: 50 mg via INTRAVENOUS

## 2015-11-28 MED ORDER — ACETAMINOPHEN 325 MG PO TABS: 325.0000 mg | ORAL_TABLET | ORAL | Status: DC | PRN

## 2015-11-28 MED ORDER — DIAZEPAM 5 MG PO TABS
ORAL_TABLET | ORAL | Status: AC
  Filled 2015-11-28: qty 1

## 2015-11-28 MED ORDER — HYDROCODONE-ACETAMINOPHEN 5-325 MG PO TABS
1.0000 | ORAL_TABLET | ORAL | Status: DC | PRN
  Administered 2015-11-28: 2 via ORAL
  Administered 2015-11-28: 1 via ORAL
  Administered 2015-11-29 – 2015-11-30 (×4): 2 via ORAL
  Filled 2015-11-28: qty 1
  Filled 2015-11-28 (×5): qty 2

## 2015-11-28 MED ORDER — BISACODYL 5 MG PO TBEC: 5.0000 mg | DELAYED_RELEASE_TABLET | Freq: Every day | ORAL | Status: DC | PRN

## 2015-11-28 MED ORDER — ZOLPIDEM TARTRATE 5 MG PO TABS: 5.0000 mg | ORAL_TABLET | Freq: Every evening | ORAL | Status: DC | PRN

## 2015-11-28 MED ORDER — LORAZEPAM 0.5 MG PO TABS: 0.5000 mg | ORAL_TABLET | Freq: Two times a day (BID) | ORAL | Status: DC | PRN

## 2015-11-28 MED ORDER — VITAMIN D-3 25 MCG (1000 UT) PO CAPS: 2500.0000 [IU] | ORAL_CAPSULE | Freq: Every day | ORAL | Status: DC

## 2015-11-28 MED ORDER — ACETAMINOPHEN 650 MG RE SUPP: 650.0000 mg | RECTAL | Status: DC | PRN

## 2015-11-28 MED ORDER — LATANOPROST 0.005 % OP SOLN
1.0000 [drp] | Freq: Every day | OPHTHALMIC | Status: DC
  Filled 2015-11-28: qty 2.5

## 2015-11-28 MED ORDER — ACETAMINOPHEN 160 MG/5ML PO SOLN
325.0000 mg | ORAL | Status: DC | PRN
Start: 2015-11-28 — End: 2015-11-28
  Filled 2015-11-28: qty 20.3

## 2015-11-28 SURGICAL SUPPLY — 54 items
BAG DECANTER FOR FLEXI CONT (MISCELLANEOUS) IMPLANT
BENZOIN TINCTURE PRP APPL 2/3 (GAUZE/BANDAGES/DRESSINGS) IMPLANT
BLADE CLIPPER SURG (BLADE) IMPLANT
BUR MATCHSTICK NEURO 3.0 LAGG (BURR) ×3 IMPLANT
BUR PRECISION FLUTE 6.0 (BURR) ×3 IMPLANT
BUR ROUND FLUTED 4 SOFT TCH (BURR) ×2 IMPLANT
BUR ROUND FLUTED 4MM SOFT TCH (BURR) ×1
CANISTER SUCT 3000ML PPV (MISCELLANEOUS) ×3 IMPLANT
CLOSURE WOUND 1/2 X4 (GAUZE/BANDAGES/DRESSINGS)
DECANTER SPIKE VIAL GLASS SM (MISCELLANEOUS) ×3 IMPLANT
DRAPE LAPAROTOMY 100X72X124 (DRAPES) ×3 IMPLANT
DRAPE MICROSCOPE LEICA (MISCELLANEOUS) ×3 IMPLANT
DRAPE POUCH INSTRU U-SHP 10X18 (DRAPES) ×3 IMPLANT
DRAPE SURG 17X23 STRL (DRAPES) ×3 IMPLANT
DURAPREP 26ML APPLICATOR (WOUND CARE) ×3 IMPLANT
ELECT REM PT RETURN 9FT ADLT (ELECTROSURGICAL) ×3
ELECTRODE REM PT RTRN 9FT ADLT (ELECTROSURGICAL) ×1 IMPLANT
GAUZE SPONGE 4X4 12PLY STRL (GAUZE/BANDAGES/DRESSINGS) IMPLANT
GAUZE SPONGE 4X4 16PLY XRAY LF (GAUZE/BANDAGES/DRESSINGS) IMPLANT
GLOVE BIO SURGEON STRL SZ8 (GLOVE) ×3 IMPLANT
GLOVE ECLIPSE 6.5 STRL STRAW (GLOVE) ×3 IMPLANT
GLOVE ECLIPSE 7.5 STRL STRAW (GLOVE) ×3 IMPLANT
GLOVE EXAM NITRILE LRG STRL (GLOVE) IMPLANT
GLOVE EXAM NITRILE MD LF STRL (GLOVE) IMPLANT
GLOVE EXAM NITRILE XL STR (GLOVE) IMPLANT
GLOVE EXAM NITRILE XS STR PU (GLOVE) IMPLANT
GLOVE INDICATOR 7.0 STRL GRN (GLOVE) ×6 IMPLANT
GLOVE INDICATOR 8.0 STRL GRN (GLOVE) ×3 IMPLANT
GLOVE INDICATOR 8.5 STRL (GLOVE) ×3 IMPLANT
GLOVE SURG SS PI 6.5 STRL IVOR (GLOVE) ×3 IMPLANT
GOWN STRL REUS W/ TWL LRG LVL3 (GOWN DISPOSABLE) ×1 IMPLANT
GOWN STRL REUS W/ TWL XL LVL3 (GOWN DISPOSABLE) ×2 IMPLANT
GOWN STRL REUS W/TWL 2XL LVL3 (GOWN DISPOSABLE) IMPLANT
GOWN STRL REUS W/TWL LRG LVL3 (GOWN DISPOSABLE) ×2
GOWN STRL REUS W/TWL XL LVL3 (GOWN DISPOSABLE) ×4
KIT BASIN OR (CUSTOM PROCEDURE TRAY) ×3 IMPLANT
KIT ROOM TURNOVER OR (KITS) ×3 IMPLANT
LIQUID BAND (GAUZE/BANDAGES/DRESSINGS) ×3 IMPLANT
NEEDLE HYPO 25X1 1.5 SAFETY (NEEDLE) ×3 IMPLANT
NEEDLE SPNL 18GX3.5 QUINCKE PK (NEEDLE) ×3 IMPLANT
NS IRRIG 1000ML POUR BTL (IV SOLUTION) ×3 IMPLANT
PACK LAMINECTOMY NEURO (CUSTOM PROCEDURE TRAY) ×3 IMPLANT
PAD ARMBOARD 7.5X6 YLW CONV (MISCELLANEOUS) ×9 IMPLANT
RUBBERBAND STERILE (MISCELLANEOUS) ×6 IMPLANT
SPONGE LAP 4X18 X RAY DECT (DISPOSABLE) IMPLANT
SPONGE SURGIFOAM ABS GEL SZ50 (HEMOSTASIS) ×3 IMPLANT
STRIP CLOSURE SKIN 1/2X4 (GAUZE/BANDAGES/DRESSINGS) IMPLANT
SUT VIC AB 0 CT1 18XCR BRD8 (SUTURE) ×1 IMPLANT
SUT VIC AB 0 CT1 8-18 (SUTURE) ×2
SUT VIC AB 2-0 CT1 18 (SUTURE) ×3 IMPLANT
SUT VIC AB 3-0 SH 8-18 (SUTURE) ×9 IMPLANT
TOWEL OR 17X24 6PK STRL BLUE (TOWEL DISPOSABLE) ×3 IMPLANT
TOWEL OR 17X26 10 PK STRL BLUE (TOWEL DISPOSABLE) ×3 IMPLANT
WATER STERILE IRR 1000ML POUR (IV SOLUTION) ×3 IMPLANT

## 2015-11-28 NOTE — Op Note (Signed)
11/28/2015  7:14 PM  PATIENT:  [name]  70 y.o. female  PRE-OPERATIVE DIAGNOSIS:  lumbar spinal stenosis L2/3,3/4  POST-OPERATIVE DIAGNOSIS:  lumbar spinal stenosis L2/3, L3/4  PROCEDURE:  Procedure(s): LUMBAR LAMINECTOMY/ DECOMPRESSION  LUMBAR TWO-THREE, LUMBAR THREE-FOUR    SURGEON:   Surgeon(s): Ashok Pall, MD Kary Kos, MD  ASSISTANTS:Cram, Dominica Severin  ANESTHESIA:   general  EBL:     BLOOD ADMINISTERED:none  CELL SAVER GIVEN:none  COUNT:per nursing  DRAINS: none   SPECIMEN:  No Specimen  DICTATION: [name] was taken to the operating room, intubated and placed under a general anesthetic without difficulty. She was positioned prone on a Wilson frame with all pressure points padded. Her back was prepped and draped in a sterile manner. I opened the skin with a 10 blade and carried the dissection down to the thoracolumbar fascia. I used both sharp dissection and the monopolar cautery to expose the lamina of L2,3, and 4. I confirmed my location with an intraoperative xray.  I used the drill, Kerrison punches, and curettes to perform bilateral semihemilaminectomies of L2,3,and 4. I used the Kerrison punches to remove the ligamentum flavum to expose the thecal sac. I decompressed the spinal canal and the L2,3, and L4 nerve roots with Dr.Cram's assistance.  I used pituitary rongeurs, curettes, and other instruments to remove disc material. After the decompression was completed we inspected the L2,3,and L4 nerve roots and felt they were well decompressed. I explored rostrally, laterally, medially, and caudally and was satisfied with the decompression. I irrigated the wound, then closed in layers. I approximated the thoracolumbar fascia, subcutaneous, and subcuticular planes with vicryl sutures. I used dermabond for a sterile dressing.   PLAN OF CARE: Admit to inpatient   PATIENT DISPOSITION:  PACU - hemodynamically stable.   Delay start of Pharmacological VTE agent  (>24hrs) due to surgical blood loss or risk of bleeding:  yes

## 2015-11-28 NOTE — Anesthesia Postprocedure Evaluation (Signed)
Anesthesia Post Note  Patient: [name]  Procedure(s) Performed: Procedure(s) (LRB): LUMBAR LAMINECTOMY/ DECOMPRESSION MICRODISCECTOMY LUMBAR TWO-THREE, LUMBAR THREE-FOUR   (N/A)  Patient location during evaluation: PACU Anesthesia Type: General Level of consciousness: awake and alert Pain management: pain level controlled Vital Signs Assessment: post-procedure vital signs reviewed and stable Respiratory status: spontaneous breathing, nonlabored ventilation, respiratory function stable and patient connected to nasal cannula oxygen Cardiovascular status: blood pressure returned to baseline and stable Postop Assessment: no signs of nausea or vomiting Anesthetic complications: no    Last Vitals:  Filed Vitals:   11/28/15 1700 11/28/15 1712  BP: 136/62   Pulse: 73   Temp:  36.4 C  Resp: 11     Last Pain:  Filed Vitals:   11/28/15 1712  PainSc: 4                  Allaya Abbasi A

## 2015-11-28 NOTE — Progress Notes (Signed)
Pt is admitted to 5M11 from PACU. Admission vital sign is stable.

## 2015-11-28 NOTE — Transfer of Care (Signed)
Immediate Anesthesia Transfer of Care Note  Patient: [name]  Procedure(s) Performed: Procedure(s): LUMBAR LAMINECTOMY/ DECOMPRESSION MICRODISCECTOMY LUMBAR TWO-THREE, LUMBAR THREE-FOUR   (N/A)  Patient Location: PACU  Anesthesia Type:General  Level of Consciousness: awake, alert , oriented and patient cooperative  Airway & Oxygen Therapy: Patient Spontanous Breathing and Patient connected to nasal cannula oxygen  Post-op Assessment: Report given to RN, Post -op Vital signs reviewed and stable and Patient moving all extremities  Post vital signs: Reviewed and stable  Last Vitals:  Filed Vitals:   11/28/15 1001 11/28/15 1515  BP: 151/59 132/55  Pulse: 59 77  Temp: 36.7 C 36.7 C  Resp: 20 20    Complications: No apparent anesthesia complications

## 2015-11-28 NOTE — H&P (Signed)
  BP 151/59 mmHg  Pulse 59  Temp(Src) 98 F (36.7 C)  Resp 20  Wt 77.8 kg (171 lb 8.3 oz)  SpO2 99% [name] presents today for evaluation of pain that she has in her back, hips, and lower extremities since Christmas Day. She has had pain on and off in the lumbar region for over 25 years. She has had pain similar to this also in the past. She has undergone therapy, epidural injections, Gabapentin, and has used a chiropractor, and she has repeated those steps this year, but the pain has not improved. She states that it is hard to walk for any period of time or hard to ride for any period of time. She says that she has pain in the right hip, leg and calf and has tingling in the right foot if she sits for a prolonged period of time and then gets up. She is currently 70 and retired and right-handed. Back pain, degenerative spinal stenosis is what she says she is experiencing. She says she was just doing everyday things when the pain started. REVIEW OF SYSTEMS: Review of systems positive for eyeglasses, glaucoma, swelling in the feet, leg pain with walking, leg weakness, back pain, leg pain, arthritis. She denies allergic, hematologic, endocrine, psychiatric, neurological, skin, genitourinary , gastrointestinal, respiratory, ear, nose, throat and mouth, or constitutional problems. On her pain chart she shows pain in the right lower extremity.  PAST MEDICAL HISTORY: Past medical history significant for hypertension. 3. Prior Operations: She has had a shoulder reconstruction done by Dr. Aline Brochure. She has had knee replacements done by Dr. Aline Brochure. She has also undergone an appendectomy, tonsillectomy, and hysterectomy in the past.  4. Medications and Allergies: SULFA-CONTAINING DRUGS CAUSE ITCHING. She currently takes Celebrex, Bupropion, Gabapentin, Flexeril, Hydrocodone, lorazepam, and Travatan Z drops. FAMILY HISTORY: Family history is significant for diabetes and myocardial infarction.  Mother died at age 37. Father died at age 88. SOCIAL HISTORY: She does not smoke. She does not use alcohol. She does not use illicit drugs. PHYSICAL EXAMINATION: She has lost 50 pounds since 2015 on [employer]. Vital signs: Height 5 feet 1.75 inches, weight 168.0 pounds, temperature is 100.1, blood pressure is 159/81. Pulse is 68, respiratory rate is 12. Pain is 10/10. NEUROLOGICAL EXAMINATION: On exam she is alert, oriented x4, and answers all questions appropriately. Memory, language, attention span and fund of knowledge are normal. Well-kempt and in minor distress. Pupils are equal, round and reactive to light. Full extraocular movements. Full visual fields, symmetric facial sensation and movement. Hearing intact to voice bilaterally . Uvula elevates in the midline. Shoulder shrug is normal. Tongue protrudes in the midline. She has 2+ reflexes in the upper extremities, at the knees. Trace at the ankles bilaterally. Downgoing toes to plantar stimulation. No clonus. No Hoffman sign. Romberg test is negative. She can toe walk and heel walk. Gait is otherwise normal. IMAGING STUDIES: MRI of the lumbar spine shows stenosis at its worst at L2-3 and L3-4. At L4-5 she has a good-sized lateral recess bulge on the left side at 4-5. IMPRESSION/PLAN: I certainly believe the culprit is the 2-3 and 3-4 levels and a simple decompression would get her past the neurogenic claudication. Risks and benefits, bleeding, infection, no relief, need for further surgery were discussed, among other risks. She understands and wishes to proceed.

## 2015-11-28 NOTE — Anesthesia Preprocedure Evaluation (Signed)
Anesthesia Evaluation  Patient identified by MRN, date of birth, ID band Patient awake    Reviewed: Allergy & Precautions, NPO status , Patient's Chart, lab work & pertinent test results  History of Anesthesia Complications Negative for: history of anesthetic complications  Airway Mallampati: II  TM Distance: >3 FB Neck ROM: Full    Dental  (+) Teeth Intact   Pulmonary neg pulmonary ROS,    breath sounds clear to auscultation       Cardiovascular hypertension,  Rhythm:Regular     Neuro/Psych PSYCHIATRIC DISORDERS Anxiety  Neuromuscular disease    GI/Hepatic Neg liver ROS,   Endo/Other  Morbid obesity  Renal/GU negative Renal ROS     Musculoskeletal  (+) Arthritis ,   Abdominal   Peds  Hematology   Anesthesia Other Findings   Reproductive/Obstetrics                             Anesthesia Physical Anesthesia Plan  ASA: II  Anesthesia Plan: General   Post-op Pain Management:    Induction: Intravenous  Airway Management Planned: Oral ETT  Additional Equipment: None  Intra-op Plan:   Post-operative Plan: Extubation in OR  Informed Consent: I have reviewed the patients History and Physical, chart, labs and discussed the procedure including the risks, benefits and alternatives for the proposed anesthesia with the patient or authorized representative who has indicated his/her understanding and acceptance.   Dental advisory given  Plan Discussed with: CRNA and Surgeon  Anesthesia Plan Comments:         Anesthesia Quick Evaluation

## 2015-11-28 NOTE — Anesthesia Procedure Notes (Signed)
Procedure Name: Intubation Date/Time: 11/28/2015 12:47 PM Performed by: Rebekah Chesterfield L Pre-anesthesia Checklist: Patient identified, Emergency Drugs available, Suction available and Patient being monitored Patient Re-evaluated:Patient Re-evaluated prior to inductionOxygen Delivery Method: Circle System Utilized Preoxygenation: Pre-oxygenation with 100% oxygen Intubation Type: IV induction Ventilation: Mask ventilation without difficulty Laryngoscope Size: Mac and 3 Grade View: Grade I Tube type: Oral Tube size: 7.0 mm Number of attempts: 1 Airway Equipment and Method: Stylet and Oral airway Placement Confirmation: ETT inserted through vocal cords under direct vision,  positive ETCO2 and breath sounds checked- equal and bilateral Secured at: 20 cm Tube secured with: Tape Dental Injury: Teeth and Oropharynx as per pre-operative assessment

## 2015-11-28 NOTE — Progress Notes (Signed)
PHARMACIST - PHYSICIAN ORDER COMMUNICATION  CONCERNING: P&T Medication Policy on Herbal Medications  DESCRIPTION:  This patient's order for:  Resveratrol, CoQ10  has been noted.  This product(s) is classified as an "herbal" or natural product. Due to a lack of definitive safety studies or FDA approval, nonstandard manufacturing practices, plus the potential risk of unknown drug-drug interactions while on inpatient medications, the Pharmacy and Therapeutics Committee does not permit the use of "herbal" or natural products of this type within [facility]   ACTION TAKEN: The pharmacy department is unable to verify this order at this time and your patient has been informed of this safety policy. Please reevaluate patient's clinical condition at discharge and address if the herbal or natural product(s) should be resumed at that time.  Gianah Batt S. Alford Highland, PharmD, [place] Clinical Staff Pharmacist Pager [phone]

## 2015-11-29 ENCOUNTER — Encounter (HOSPITAL_COMMUNITY): Payer: Self-pay | Admitting: *Deleted

## 2015-11-29 NOTE — Progress Notes (Signed)
Patient ID: [name], female   DOB: 1945-09-26, 70 y.o.   MRN: [id number] Patient doing well significant improvement leg pain that is not ambulated yet  Strength out of 5 wound clean dry and intact  Physical therapy today possibly discharge tomorrow

## 2015-11-29 NOTE — Evaluation (Signed)
Physical Therapy Evaluation Patient Details Name: [name] MRN: [id number] DOB: 03/24/1946 Today's Date: 11/29/2015   History of Present Illness  Patient is a 70 y/o female with hx of glaucoma, anxiety, HTN, HLD, Bil TKA presents s/p L2-3, 3-4 lumbar laminectomy/decompression.  Clinical Impression  Patient presents with pain, decreased strength, post surgical deficits and impaired balance s/p above surgery impacting mobility. Tolerated gait training with Min guard assist for safety. Education re: back precautions, how to incorporate them into ADLs, log roll technique, home safety etc. Pt will have support of grandson for 1 week as he is on spring break. Encouraged ambulation with RN while in hospital to improve mobility/strength. Will follow acutely to maximize independence and mobility prior to return home.    Follow Up Recommendations No PT follow up;Supervision - Intermittent    Equipment Recommendations  None recommended by PT    Recommendations for Other Services       Precautions / Restrictions Precautions Precautions: Back Precaution Booklet Issued: No Precaution Comments: Reviewed precautions. Pt able to verbalize at end of session. Restrictions Weight Bearing Restrictions: No      Mobility  Bed Mobility Overal bed mobility: Needs Assistance Bed Mobility: Rolling;Sidelying to Sit Rolling: Supervision Sidelying to sit: Supervision;HOB elevated       General bed mobility comments: Cues for log roll technique. Use of rail for support.   Transfers Overall transfer level: Needs assistance Equipment used: None Transfers: Sit to/from Stand Sit to Stand: Min guard         General transfer comment: Min guard for safety. Stood from [employer], from toilet x1. Transferred to chair post ambulation bout.  Ambulation/Gait Ambulation/Gait assistance: Min guard Ambulation Distance (Feet): 250 Feet Assistive device: None Gait Pattern/deviations: Step-through  pattern;Decreased stride length;Staggering left;Staggering right Gait velocity: decreased Gait velocity interpretation: Below normal speed for age/gender General Gait Details: Mildly unsteady gait holding onto rail for support. No overt LOB but imbalance noted.   Stairs            Wheelchair Mobility    Modified Rankin (Stroke Patients Only)       Balance Overall balance assessment: Needs assistance Sitting-balance support: Feet supported;No upper extremity supported Sitting balance-Leahy Scale: Good Sitting balance - Comments: Independent for toileting   Standing balance support: During functional activity Standing balance-Leahy Scale: Fair                               Pertinent Vitals/Pain Pain Assessment: Faces Faces Pain Scale: Hurts even more Pain Location: left lateral thigh; incision Pain Descriptors / Indicators: Sore;Aching;Operative site guarding Pain Intervention(s): Monitored during session;Repositioned;Ice applied    Home Living Family/patient expects to be discharged to:: Private residence Living Arrangements: Other relatives (29 y/o grandson) Available Help at Discharge: Family;Available 24 hours/day (grandson will be there for the next week on spring break) Type of Home: House Home Access: Stairs to enter   [employer] of Steps: 1 step in Home Layout: One level Home Equipment: Cane - single point      Prior Function Level of Independence: Independent         Comments: Drives.      Hand Dominance        Extremity/Trunk Assessment   Upper Extremity Assessment: Defer to OT evaluation           Lower Extremity Assessment: Generalized weakness         Communication   Communication: No  difficulties  Cognition Arousal/Alertness: Awake/alert Behavior During Therapy: WFL for tasks assessed/performed Overall Cognitive Status: Within Functional Limits for tasks assessed                       General Comments      Exercises        Assessment/Plan    PT Assessment Patient needs continued PT services  PT Diagnosis Difficulty walking;Generalized weakness;Acute pain   PT Problem List Decreased strength;Pain;Decreased balance;Decreased mobility;Decreased knowledge of precautions;Decreased activity tolerance;Decreased knowledge of use of DME  PT Treatment Interventions Balance training;Gait training;Stair training;Functional mobility training;Therapeutic activities;Therapeutic exercise;Patient/family education   PT Goals (Current goals can be found in the Care Plan section) Acute Rehab PT Goals Patient Stated Goal: to return home and to life PT Goal Formulation: With patient Time For Goal Achievement: 12/13/15 Potential to Achieve Goals: Good    Frequency Min 5X/week   Barriers to discharge        Co-evaluation               End of Session Equipment Utilized During Treatment: Gait belt Activity Tolerance: Patient tolerated treatment well Patient left: in chair;with call bell/phone within reach Nurse Communication: Mobility status         Time: [id number] PT Time Calculation (min) (ACUTE ONLY): 22 min   Charges:   PT Evaluation $PT Eval Moderate Complexity: 1 Procedure     PT G Codes:        Ayvion Kavanagh A Travia Onstad 11/29/2015, 3:52 PM Wray Kearns, [place], DPT [phone]

## 2015-11-30 MED ORDER — HYDROCODONE-ACETAMINOPHEN 5-325 MG PO TABS: 1.0000 | ORAL_TABLET | ORAL | Status: DC | PRN

## 2015-11-30 NOTE — Progress Notes (Signed)
Discharge instructions reviewed with patient.  1 prescription given (Vicodin).  All questions answered.  Family should be here to pick up patient around 12 noon.

## 2015-11-30 NOTE — Progress Notes (Addendum)
Pt discharged to home with family. Rolling walker delivered to patient prior to discharge.

## 2015-11-30 NOTE — Progress Notes (Signed)
Physical Therapy Treatment Patient Details Name: TYANAH TIETJEN MRN: [id number] DOB: 02-01-1946 Today's Date: 11/30/2015    History of Present Illness Patient is a 70 y/o female with hx of glaucoma, anxiety, HTN, HLD, Bil TKA presents s/p L2-3, 3-4 lumbar laminectomy/decompression.    PT Comments    Pt with increased soreness this date. Discussed pt's concern for R LE weakness and noted progressive trunk weakness and instability during ambulation without RW. Recommended RW for long distance ambulation and to allow for improved posture and decrease stress on back until pt has improved both LE and truncal strength. Pt agreeable and desires advanced home care and therapist Elsie Lincoln or [facility] as she's had them in the past.   Follow Up Recommendations  Home health PT;Supervision/Assistance - 24 hour     Equipment Recommendations  Rolling walker with 5" wheels    Recommendations for Other Services       Precautions / Restrictions Precautions Precautions: Back Precaution Booklet Issued: Yes (comment) Precaution Comments: pt re-educated on precautions as pt unable to recall Restrictions Weight Bearing Restrictions: No    Mobility  Bed Mobility Overal bed mobility: Needs Assistance Bed Mobility: Rolling;Sidelying to Sit;Sit to Sidelying Rolling: Supervision Sidelying to sit: Supervision     Sit to sidelying: Supervision General bed mobility comments: v/c's for technique, no use of bed rails, increased time  Transfers Overall transfer level: Needs assistance Equipment used: None Transfers: Sit to/from Stand Sit to Stand: Min guard         General transfer comment: increased time  Ambulation/Gait Ambulation/Gait assistance: Min guard Ambulation Distance (Feet): 250 Feet Assistive device: None;Rolling walker (2 wheeled) Gait Pattern/deviations: Step-through pattern;Decreased stride length Gait velocity: decreased Gait velocity interpretation: Below normal  speed for age/gender General Gait Details: unsteady, reaching for rails and objects to hold onto. pt progressively with increased trunk flexion. pt given RW pt with much improved posture stability and fluidity to gait pattern. Pt reports "the walker feels so much better"   Stairs            Wheelchair Mobility    Modified Rankin (Stroke Patients Only)       Balance Overall balance assessment: Needs assistance         Standing balance support: During functional activity Standing balance-Leahy Scale: Fair                      Cognition Arousal/Alertness: Awake/alert Behavior During Therapy: WFL for tasks assessed/performed Overall Cognitive Status: Within Functional Limits for tasks assessed                      Exercises      General Comments        Pertinent Vitals/Pain Pain Assessment: 0-10 Pain Score: 8  Faces Pain Scale:  (decreased to 6-7 s/p ambulation) Pain Location: back Pain Descriptors / Indicators: Sore Pain Intervention(s): Monitored during session    Home Living                      Prior Function            PT Goals (current goals can now be found in the care plan section) Progress towards PT goals: Progressing toward goals    Frequency  Min 5X/week    PT Plan Discharge plan needs to be updated    Co-evaluation             End of Session Equipment Utilized  During Treatment: Gait belt Activity Tolerance: Patient tolerated treatment well Patient left: in chair;with call bell/phone within reach     Time: 0756-0815 PT Time Calculation (min) (ACUTE ONLY): 19 min  Charges:  $Gait Training: 8-22 mins                    G Codes:      Kingsley Callander 11/30/2015, 8:24 AM   Kittie Plater, PT, DPT Pager #: [phone] Office #: [phone]

## 2015-11-30 NOTE — Progress Notes (Signed)
Patient ID: [name], female   DOB: 31-Aug-1945, 70 y.o.   MRN: [id number] Patient doing well ready for discharge

## 2015-11-30 NOTE — Discharge Instructions (Signed)
No lifting no bending no twisting no driving °

## 2015-11-30 NOTE — Care Management Note (Signed)
Case Management Note  Patient Details  Name: [name] MRN: [id number] Date of Birth: 1946/01/09  Subjective/Objective:                  Lumbar stenosis with neurogenic claudication   Action/Plan: Discharge planning  Expected Discharge Date:  11/30/15               Expected Discharge Plan:  [place]  In-House Referral:     Discharge planning Services  CM Consult  Post Acute Care Choice:    Choice offered to:  Patient  DME Arranged:  Gilford Rile rolling DME Agency:  [place] Arranged:  PT [facility] Agency:  [place]  Status of Service:  Completed, signed off  Medicare Important Message Given:    Date Medicare IM Given:    Medicare IM give by:    Date Additional Medicare IM Given:    Additional Medicare Important Message give by:     If discussed at [place] of Stay Meetings, dates discussed:    Additional Comments: CM met with pt in room to offer choice ofhome health agency.  Pt chooses [name] or [name] to render HHPT.  Referral called to [facility] rep, [name].  Pt will be staying at [place], [place] [address]. CM called [facility] DME rep, [name] to please deliver the rolling walker to room prior to discharge.  No other CM needs were communicated. [name], RN 11/30/2015, 9:18 AM

## 2015-11-30 NOTE — Discharge Summary (Signed)
Physician Discharge Summary  Patient ID: [name] MRN: [id number] DOB/AGE: 05/18/1946 70 y.o.  Admit date: 11/28/2015 Discharge date: 11/30/2015  Admission Diagnoses:Lumbar spinal stenosis  Discharge Diagnoses: Same Active Problems:   Lumbar stenosis with neurogenic claudication   Discharged Condition: good  Hospital Course: Patient is been the hospital underwent decompressive laminectomy postoperative patient did very well recovered in the floor on the floor she was angling and voiding spontaneously tolerating regular diet and pain was well controlled on pills and was stable for discharge home on postoperative day 2.  Consults: Significant Diagnostic Studies: Treatments: Decompressive lumbar laminectomy L2-3 L3-4 Discharge Exam: Blood pressure 114/57, pulse 63, temperature 97.9 F (36.6 C), temperature source Oral, resp. rate 16, height 5\' 3"  (1.6 m), weight 84.641 kg (186 lb 9.6 oz), SpO2 96 %. Strength out of 5 wound clean dry and intact  Disposition: Home     Medication List    TAKE these medications        ASTAXANTHIN PO  Take 1 tablet by mouth daily.     ATIVAN 0.5 MG tablet  Generic drug:  LORazepam  Take 0.5 mg by mouth 2 (two) times daily as needed for anxiety.     buPROPion 150 MG 24 hr tablet  Commonly known as:  WELLBUTRIN XL  Take 150 mg by mouth daily.     celecoxib 100 MG capsule  Commonly known as:  CELEBREX  TAKE 1 CAPSULE TWICE DAILY     CINNAMON PO  Take 350 mg by mouth daily.     CoQ10 200 MG Caps  Take 200 mg by mouth daily.     cyanocobalamin 500 MCG tablet  Take 500 mcg by mouth daily.     cyclobenzaprine 10 MG tablet  Commonly known as:  FLEXERIL  Take 1 tablet (10 mg total) by mouth 3 (three) times daily as needed for muscle spasms.     gabapentin 100 MG capsule  Commonly known as:  NEURONTIN  Take 1 capsule (100 mg total) by mouth 2 (two) times daily.     HAIR/SKIN/NAILS PO  Take 1 tablet by mouth daily.     HYDROcodone-acetaminophen 7.5-325 MG tablet  Commonly known as:  NORCO  Take 1 tablet by mouth every 4 (four) hours as needed for moderate pain.     HYDROcodone-acetaminophen 5-325 MG tablet  Commonly known as:  NORCO/VICODIN  Take 1-2 tablets by mouth every 4 (four) hours as needed (mild pain).     OMEGA 3 PO  Take 2,000 mg by mouth daily.     OVER THE COUNTER MEDICATION  Take 1 tablet by mouth daily.     OVER THE COUNTER MEDICATION  Take 1 tablet by mouth daily.     Resveratrol 100 MG Caps  Take 100 mg by mouth daily.     SYSTANE BALANCE 0.6 % Soln  Generic drug:  Propylene Glycol  Place 1 drop into both eyes daily as needed (for dry eyes).     TRAVATAN Z 0.004 % Soln ophthalmic solution  Generic drug:  Travoprost (BAK Free)  Place 1 drop into both eyes at bedtime.     triamterene-hydrochlorothiazide 37.5-25 MG tablet  Commonly known as:  MAXZIDE-25  Take 1 tablet by mouth daily as needed (for fluid).     TURMERIC PO  Take 200 mg by mouth daily.     VITAMIN D-3 PO  Take 2,500 Units by mouth daily.           Follow-up Information  Follow up with CABBELL,KYLE L, MD.   Specialty:  Neurosurgery   Contact information:   [address] [place] [address] [phone]       Signed: Elaina Hoops 11/30/2015, 7:23 AM

## 2015-11-30 NOTE — Progress Notes (Signed)
Utilization review completed.  

## 2015-12-01 DIAGNOSIS — M4806 Spinal stenosis, lumbar region: Secondary | ICD-10-CM | POA: Diagnosis not present

## 2015-12-01 DIAGNOSIS — Z4789 Encounter for other orthopedic aftercare: Secondary | ICD-10-CM | POA: Diagnosis not present

## 2015-12-02 ENCOUNTER — Encounter (HOSPITAL_COMMUNITY): Payer: Self-pay | Admitting: Neurosurgery

## 2015-12-03 DIAGNOSIS — M4806 Spinal stenosis, lumbar region: Secondary | ICD-10-CM | POA: Diagnosis not present

## 2015-12-03 DIAGNOSIS — Z4789 Encounter for other orthopedic aftercare: Secondary | ICD-10-CM | POA: Diagnosis not present

## 2015-12-04 DIAGNOSIS — Z4789 Encounter for other orthopedic aftercare: Secondary | ICD-10-CM | POA: Diagnosis not present

## 2015-12-04 DIAGNOSIS — M4806 Spinal stenosis, lumbar region: Secondary | ICD-10-CM | POA: Diagnosis not present

## 2015-12-09 DIAGNOSIS — M4806 Spinal stenosis, lumbar region: Secondary | ICD-10-CM | POA: Diagnosis not present

## 2015-12-09 DIAGNOSIS — Z4789 Encounter for other orthopedic aftercare: Secondary | ICD-10-CM | POA: Diagnosis not present

## 2015-12-12 DIAGNOSIS — Z4789 Encounter for other orthopedic aftercare: Secondary | ICD-10-CM | POA: Diagnosis not present

## 2015-12-12 DIAGNOSIS — M4806 Spinal stenosis, lumbar region: Secondary | ICD-10-CM | POA: Diagnosis not present

## 2015-12-23 ENCOUNTER — Other Ambulatory Visit (HOSPITAL_COMMUNITY): Payer: Self-pay | Admitting: Neurosurgery

## 2015-12-23 DIAGNOSIS — M48061 Spinal stenosis, lumbar region without neurogenic claudication: Secondary | ICD-10-CM

## 2015-12-24 ENCOUNTER — Telehealth: Payer: Self-pay | Admitting: Orthopedic Surgery

## 2015-12-24 NOTE — Telephone Encounter (Signed)
Patient called to relay that she has had surgery by Dr Cyndy Freeze, 11/28/15, per referral by our office.  States she was pleased with Dr Cyndy Freeze, and had "an easy recovery", however, "the surgery did not work."  Her question is "would Dr Aline Brochure be able to review the upcoming scheduled MRI at [facility] on 12/29/15, to compare it to the initial MRI from 10/24/15.  Please advise.  Patient ph# [phone]

## 2015-12-24 NOTE — Telephone Encounter (Signed)
Patient scheduled with Dr. Christella Noa 11/10/15 at 11:00 am

## 2015-12-24 NOTE — Telephone Encounter (Signed)
Review it, yes

## 2015-12-25 NOTE — Telephone Encounter (Signed)
Called patient to relay.  States she will call back to let us know that MRI has been done. (Re-send note to Dr Aline Brochure at that time)

## 2015-12-29 ENCOUNTER — Ambulatory Visit (HOSPITAL_COMMUNITY)
Admission: RE | Admit: 2015-12-29 | Discharge: 2015-12-29 | Disposition: A | Discharge status: Home / Self Care | Payer: Medicare Other | Source: Ambulatory Visit | Attending: Neurosurgery | Admitting: Neurosurgery

## 2015-12-29 DIAGNOSIS — M4806 Spinal stenosis, lumbar region: Secondary | ICD-10-CM | POA: Insufficient documentation

## 2015-12-29 DIAGNOSIS — M48061 Spinal stenosis, lumbar region without neurogenic claudication: Secondary | ICD-10-CM

## 2015-12-29 MED ORDER — GADOBENATE DIMEGLUMINE 529 MG/ML IV SOLN
16.0000 mL | Freq: Once | INTRAVENOUS | Status: AC | PRN
  Administered 2015-12-29: 16 mL via INTRAVENOUS

## 2016-01-01 NOTE — Telephone Encounter (Signed)
Re-routing to Dr Aline Brochure to review MRI as patient requests, which was ordered by neurosurgeon and done at [facility] on 12/29/15.

## 2016-01-27 DIAGNOSIS — Z299 Encounter for prophylactic measures, unspecified: Secondary | ICD-10-CM | POA: Diagnosis not present

## 2016-01-27 DIAGNOSIS — M542 Cervicalgia: Secondary | ICD-10-CM | POA: Diagnosis not present

## 2016-02-12 ENCOUNTER — Ambulatory Visit: Payer: Medicare Other | Admitting: Orthopedic Surgery

## 2016-02-13 DIAGNOSIS — Z1389 Encounter for screening for other disorder: Secondary | ICD-10-CM | POA: Diagnosis not present

## 2016-02-13 DIAGNOSIS — Z7189 Other specified counseling: Secondary | ICD-10-CM | POA: Diagnosis not present

## 2016-02-13 DIAGNOSIS — Z299 Encounter for prophylactic measures, unspecified: Secondary | ICD-10-CM | POA: Diagnosis not present

## 2016-02-13 DIAGNOSIS — Z6831 Body mass index (BMI) 31.0-31.9, adult: Secondary | ICD-10-CM | POA: Diagnosis not present

## 2016-02-13 DIAGNOSIS — F419 Anxiety disorder, unspecified: Secondary | ICD-10-CM | POA: Diagnosis not present

## 2016-02-13 DIAGNOSIS — I10 Essential (primary) hypertension: Secondary | ICD-10-CM | POA: Diagnosis not present

## 2016-02-13 DIAGNOSIS — Z0000 Encounter for general adult medical examination without abnormal findings: Secondary | ICD-10-CM | POA: Diagnosis not present

## 2016-02-13 DIAGNOSIS — Z1211 Encounter for screening for malignant neoplasm of colon: Secondary | ICD-10-CM | POA: Diagnosis not present

## 2016-02-13 DIAGNOSIS — Z79899 Other long term (current) drug therapy: Secondary | ICD-10-CM | POA: Diagnosis not present

## 2016-02-17 ENCOUNTER — Ambulatory Visit (INDEPENDENT_AMBULATORY_CARE_PROVIDER_SITE_OTHER): Payer: Medicare Other

## 2016-02-17 ENCOUNTER — Encounter: Payer: Self-pay | Admitting: Orthopedic Surgery

## 2016-02-17 ENCOUNTER — Ambulatory Visit (INDEPENDENT_AMBULATORY_CARE_PROVIDER_SITE_OTHER): Payer: Medicare Other | Admitting: Orthopedic Surgery

## 2016-02-17 VITALS — BP 147/78 | Ht 63.0 in | Wt 179.0 lb

## 2016-02-17 DIAGNOSIS — M79671 Pain in right foot: Secondary | ICD-10-CM

## 2016-02-17 DIAGNOSIS — M6789 Other specified disorders of synovium and tendon, multiple sites: Secondary | ICD-10-CM | POA: Diagnosis not present

## 2016-02-17 DIAGNOSIS — M5441 Lumbago with sciatica, right side: Secondary | ICD-10-CM

## 2016-02-17 DIAGNOSIS — M5430 Sciatica, unspecified side: Secondary | ICD-10-CM

## 2016-02-17 DIAGNOSIS — M76829 Posterior tibial tendinitis, unspecified leg: Secondary | ICD-10-CM

## 2016-02-17 MED ORDER — GABAPENTIN 100 MG PO CAPS: 100.0000 mg | ORAL_CAPSULE | Freq: Three times a day (TID) | ORAL | Status: DC

## 2016-02-17 MED ORDER — HYDROCODONE-ACETAMINOPHEN 7.5-325 MG PO TABS: 1.0000 | ORAL_TABLET | ORAL | Status: DC | PRN

## 2016-02-17 NOTE — Progress Notes (Signed)
Chief Complaint  Patient presents with  . Back Pain    right sided low back pain, radicular   HPI 70 yo female, spinal surgery for lower back pain and right leg pain presents with right foot ankle pain and intermittent leg and hip pain. She c/o 9/10 lateral foot pain near the subtalar joint which seems to radiate and is worse with walking, but she says it does not feel like her neve pain  Review of Systems  Musculoskeletal: Positive for back pain and joint pain.  Neurological: Positive for tingling and sensory change.  Psychiatric/Behavioral: Positive for depression.    Past Medical History  Diagnosis Date  . Arthritis   . High cholesterol   . High blood pressure     no longer on medications  . Anxiety   . Glaucoma     Past Surgical History  Procedure Laterality Date  . Appendectomy    . Tonsilectomy, adenoidectomy, bilateral myringotomy and tubes    . Partial hysterectomy    . Knee arthroplasty Right   . Knee arthroplasty Left   . Joint replacement      both knees 2009 and shoulder reconstruction 2006  . Lumbar laminectomy/decompression microdiscectomy N/A 11/28/2015    Procedure: LUMBAR LAMINECTOMY/ DECOMPRESSION MICRODISCECTOMY LUMBAR TWO-THREE, LUMBAR THREE-FOUR  ;  Surgeon: Ashok Pall, MD;  Location: [place] [facility];  Service: Neurosurgery;  Laterality: N/A;   No family history on file. Social History  Substance Use Topics  . Smoking status: Never Smoker   . Smokeless tobacco: None  . Alcohol Use: No    Current outpatient prescriptions:  .  ASTAXANTHIN PO, Take 1 tablet by mouth daily., Disp: , Rfl:  .  Biotin w/ Vitamins C & E (HAIR/SKIN/NAILS PO), Take 1 tablet by mouth daily., Disp: , Rfl:  .  buPROPion (WELLBUTRIN XL) 300 MG 24 hr tablet, Take 300 mg by mouth daily., Disp: , Rfl:  .  celecoxib (CELEBREX) 100 MG capsule, TAKE 1 CAPSULE TWICE DAILY, Disp: 180 capsule, Rfl: 3 .  Cholecalciferol (VITAMIN D-3 PO), Take 2,500 Units by mouth daily. , Disp: , Rfl:  .   CINNAMON PO, Take 350 mg by mouth daily. , Disp: , Rfl:  .  Coenzyme Q10 (COQ10) 200 MG CAPS, Take 200 mg by mouth daily. , Disp: , Rfl:  .  cyanocobalamin 500 MCG tablet, Take 500 mcg by mouth daily., Disp: , Rfl:  .  cyclobenzaprine (FLEXERIL) 10 MG tablet, Take 1 tablet (10 mg total) by mouth 3 (three) times daily as needed for muscle spasms. (Patient taking differently: Take 10 mg by mouth daily as needed for muscle spasms. ), Disp: 90 tablet, Rfl: 0 .  diclofenac (VOLTAREN) 75 MG EC tablet, Take 75 mg by mouth 2 (two) times daily., Disp: , Rfl:  .  gabapentin (NEURONTIN) 100 MG capsule, Take 1 capsule (100 mg total) by mouth 3 (three) times daily., Disp: 90 capsule, Rfl: 0 .  HYDROcodone-acetaminophen (NORCO) 7.5-325 MG tablet, Take 1 tablet by mouth every 4 (four) hours as needed for moderate pain., Disp: 60 tablet, Rfl: 0 .  HYDROcodone-acetaminophen (NORCO/VICODIN) 5-325 MG tablet, Take 1-2 tablets by mouth every 4 (four) hours as needed (mild pain)., Disp: 60 tablet, Rfl: 0 .  LORazepam (ATIVAN) 0.5 MG tablet, Take 0.5 mg by mouth 2 (two) times daily as needed for anxiety., Disp: , Rfl:  .  Omega-3 Fatty Acids (OMEGA 3 PO), Take 2,000 mg by mouth daily. , Disp: , Rfl:  .  OVER  THE COUNTER MEDICATION, Take 1 tablet by mouth daily., Disp: , Rfl:  .  OVER THE COUNTER MEDICATION, Take 1 tablet by mouth daily., Disp: , Rfl:  .  Propylene Glycol (SYSTANE BALANCE) 0.6 % SOLN, Place 1 drop into both eyes daily as needed (for dry eyes)., Disp: , Rfl:  .  Resveratrol 100 MG CAPS, Take 100 mg by mouth daily., Disp: , Rfl:  .  tiZANidine (ZANAFLEX) 4 MG capsule, Take 4 mg by mouth 3 (three) times daily., Disp: , Rfl:  .  TRAVATAN Z 0.004 % SOLN ophthalmic solution, Place 1 drop into both eyes at bedtime. , Disp: , Rfl:  .  triamterene-hydrochlorothiazide (MAXZIDE-25) 37.5-25 MG per tablet, Take 1 tablet by mouth daily as needed (for fluid). , Disp: , Rfl:  .  TURMERIC PO, Take 200 mg by mouth daily.,  Disp: , Rfl:   BP 147/78 mmHg  Ht 5\' 3"  (1.6 m)  Wt 179 lb (81.194 kg)  BMI 31.72 kg/m2  Physical Exam  Constitutional: She is oriented to person, place, and time. She appears well-developed and well-nourished. No distress.  Cardiovascular: Normal rate and intact distal pulses.   Musculoskeletal:  Gait normal   Neurological: She is alert and oriented to person, place, and time. She has normal reflexes. She exhibits normal muscle tone. Coordination normal.  Skin: Skin is warm and dry. No rash noted. She is not diaphoretic. No erythema. No pallor.  Psychiatric: She has a normal mood and affect. Her behavior is normal. Judgment and thought content normal.    Back Exam   Muscle Strength  Right Quadriceps:  5/5  Left Quadriceps:  5/5  Right Hamstrings:  5/5  Left Hamstrings:  5/5      Mild pes planus appears to be flexible, subtalar joint tenderness. Normal ankle range of motion. No atrophy in the foot skin normal. Ankle joint stable. Weakness posterior tibial tendon. No tenderness in the midfoot.-Right foot ankle  Mild pes planus flexible no subtalar joint tenderness normal range of motion stability and no atrophy left foot ankle.  ASSESSMENT: My personal interpretation of the images:  Of the foot findings : midfoot arthritis  Of the spine severe multilevel disc disease     PLAN spenco orthotics Refill norco for chronic pain urine test next visit    Arther Abbott, MD 02/17/2016 3:42 PM

## 2016-03-15 ENCOUNTER — Ambulatory Visit: Payer: Medicare Other | Admitting: Orthopedic Surgery

## 2016-04-12 DIAGNOSIS — H2512 Age-related nuclear cataract, left eye: Secondary | ICD-10-CM | POA: Diagnosis not present

## 2016-04-12 DIAGNOSIS — H401131 Primary open-angle glaucoma, bilateral, mild stage: Secondary | ICD-10-CM | POA: Diagnosis not present

## 2016-04-12 DIAGNOSIS — H02401 Unspecified ptosis of right eyelid: Secondary | ICD-10-CM | POA: Diagnosis not present

## 2016-04-12 DIAGNOSIS — H25811 Combined forms of age-related cataract, right eye: Secondary | ICD-10-CM | POA: Diagnosis not present

## 2016-05-05 ENCOUNTER — Other Ambulatory Visit: Payer: Self-pay | Admitting: *Deleted

## 2016-05-05 MED ORDER — CELECOXIB 100 MG PO CAPS: 100.0000 mg | ORAL_CAPSULE | Freq: Two times a day (BID) | ORAL | 3 refills | Status: DC

## 2016-05-06 ENCOUNTER — Telehealth: Payer: Self-pay | Admitting: Orthopedic Surgery

## 2016-05-06 NOTE — Telephone Encounter (Signed)
Brandi from [employer], [place], called to verify that patient may have the generic medication, which appears to have been re-ordered through patient's local pharmacy, [facility], in [place], on 05/05/16.  Please advise regarding: celecoxib (CELEBREX) 100 MG capsule [id number]  Order Details  Dose: 100 mg Route: Oral Frequency: 2 times daily  Dispense Quantity:  180 capsule Refills:  3

## 2016-05-07 NOTE — Telephone Encounter (Signed)
Fax received as well

## 2016-07-05 ENCOUNTER — Other Ambulatory Visit: Payer: Self-pay | Admitting: *Deleted

## 2016-07-05 ENCOUNTER — Telehealth: Payer: Self-pay | Admitting: Orthopedic Surgery

## 2016-07-05 MED ORDER — CEPHALEXIN 500 MG PO CAPS: ORAL_CAPSULE | ORAL | 5 refills | Status: DC

## 2016-07-05 NOTE — Telephone Encounter (Signed)
Antibiotic sent to pharmacy, patient aware

## 2016-07-05 NOTE — Telephone Encounter (Signed)
[name] called this morning and stated that her new dentist, Dr. Laretta Alstrom needs for [place] to call them.  They need to know if this patient needs to have antibiotics before she has her teeth cleaned.  She said she did not think that she did since her TKA was in 2009 but the office asks that we call to confirm this.  Please call Dr. Laretta Alstrom' office at [phone]

## 2016-08-04 ENCOUNTER — Telehealth: Payer: Self-pay | Admitting: Orthopedic Surgery

## 2016-08-04 ENCOUNTER — Other Ambulatory Visit: Payer: Self-pay | Admitting: *Deleted

## 2016-08-04 MED ORDER — CELECOXIB 100 MG PO CAPS: 100.0000 mg | ORAL_CAPSULE | Freq: Two times a day (BID) | ORAL | 3 refills | Status: DC

## 2016-08-04 NOTE — Telephone Encounter (Signed)
yes

## 2016-08-04 NOTE — Telephone Encounter (Signed)
Patient, per mail order pharmacy, [facility], states patient is to be using the generic for medication, for which she receives a 90-day supply:  celecoxib (CELEBREX) 100 MG capsule 180 capsule 3 05/05/2016   Please advise.  (Patient ph [phone])

## 2016-08-04 NOTE — Telephone Encounter (Signed)
Routing to Dr Harrison 

## 2016-08-11 ENCOUNTER — Other Ambulatory Visit: Payer: Self-pay | Admitting: *Deleted

## 2016-08-11 MED ORDER — CELECOXIB 100 MG PO CAPS: 100.0000 mg | ORAL_CAPSULE | Freq: Two times a day (BID) | ORAL | 3 refills | Status: DC

## 2016-08-12 DIAGNOSIS — Z1231 Encounter for screening mammogram for malignant neoplasm of breast: Secondary | ICD-10-CM | POA: Diagnosis not present

## 2016-10-28 DIAGNOSIS — E785 Hyperlipidemia, unspecified: Secondary | ICD-10-CM | POA: Diagnosis not present

## 2016-10-28 DIAGNOSIS — S9031XA Contusion of right foot, initial encounter: Secondary | ICD-10-CM | POA: Diagnosis not present

## 2016-10-28 DIAGNOSIS — I10 Essential (primary) hypertension: Secondary | ICD-10-CM | POA: Diagnosis not present

## 2016-10-28 DIAGNOSIS — Z6831 Body mass index (BMI) 31.0-31.9, adult: Secondary | ICD-10-CM | POA: Diagnosis not present

## 2016-10-28 DIAGNOSIS — S9030XA Contusion of unspecified foot, initial encounter: Secondary | ICD-10-CM | POA: Diagnosis not present

## 2016-10-28 DIAGNOSIS — Z713 Dietary counseling and surveillance: Secondary | ICD-10-CM | POA: Diagnosis not present

## 2016-10-28 DIAGNOSIS — Z299 Encounter for prophylactic measures, unspecified: Secondary | ICD-10-CM | POA: Diagnosis not present

## 2016-11-17 DIAGNOSIS — Z6831 Body mass index (BMI) 31.0-31.9, adult: Secondary | ICD-10-CM | POA: Diagnosis not present

## 2016-11-17 DIAGNOSIS — Z713 Dietary counseling and surveillance: Secondary | ICD-10-CM | POA: Diagnosis not present

## 2016-11-17 DIAGNOSIS — Z299 Encounter for prophylactic measures, unspecified: Secondary | ICD-10-CM | POA: Diagnosis not present

## 2016-11-17 DIAGNOSIS — M79671 Pain in right foot: Secondary | ICD-10-CM | POA: Diagnosis not present

## 2016-11-17 DIAGNOSIS — Z789 Other specified health status: Secondary | ICD-10-CM | POA: Diagnosis not present

## 2016-11-17 DIAGNOSIS — I10 Essential (primary) hypertension: Secondary | ICD-10-CM | POA: Diagnosis not present

## 2016-12-01 DIAGNOSIS — H401131 Primary open-angle glaucoma, bilateral, mild stage: Secondary | ICD-10-CM | POA: Diagnosis not present

## 2017-02-09 ENCOUNTER — Emergency Department (HOSPITAL_COMMUNITY)
Admission: EM | Admit: 2017-02-09 | Discharge: 2017-02-09 | Disposition: A | Discharge status: Home / Self Care | Payer: Medicare Other | Attending: Emergency Medicine | Admitting: Emergency Medicine

## 2017-02-09 ENCOUNTER — Encounter (HOSPITAL_COMMUNITY): Payer: Self-pay | Admitting: Emergency Medicine

## 2017-02-09 ENCOUNTER — Emergency Department (HOSPITAL_COMMUNITY): Payer: Medicare Other

## 2017-02-09 DIAGNOSIS — Y999 Unspecified external cause status: Secondary | ICD-10-CM | POA: Insufficient documentation

## 2017-02-09 DIAGNOSIS — S43402A Unspecified sprain of left shoulder joint, initial encounter: Secondary | ICD-10-CM | POA: Diagnosis not present

## 2017-02-09 DIAGNOSIS — W19XXXA Unspecified fall, initial encounter: Secondary | ICD-10-CM

## 2017-02-09 DIAGNOSIS — Y929 Unspecified place or not applicable: Secondary | ICD-10-CM | POA: Diagnosis not present

## 2017-02-09 DIAGNOSIS — Y9301 Activity, walking, marching and hiking: Secondary | ICD-10-CM | POA: Insufficient documentation

## 2017-02-09 DIAGNOSIS — S0990XA Unspecified injury of head, initial encounter: Secondary | ICD-10-CM | POA: Diagnosis present

## 2017-02-09 DIAGNOSIS — S0181XA Laceration without foreign body of other part of head, initial encounter: Secondary | ICD-10-CM

## 2017-02-09 DIAGNOSIS — Z79899 Other long term (current) drug therapy: Secondary | ICD-10-CM | POA: Insufficient documentation

## 2017-02-09 DIAGNOSIS — Z79891 Long term (current) use of opiate analgesic: Secondary | ICD-10-CM | POA: Diagnosis not present

## 2017-02-09 DIAGNOSIS — W01198A Fall on same level from slipping, tripping and stumbling with subsequent striking against other object, initial encounter: Secondary | ICD-10-CM | POA: Diagnosis not present

## 2017-02-09 DIAGNOSIS — Z791 Long term (current) use of non-steroidal anti-inflammatories (NSAID): Secondary | ICD-10-CM | POA: Diagnosis not present

## 2017-02-09 DIAGNOSIS — S4992XA Unspecified injury of left shoulder and upper arm, initial encounter: Secondary | ICD-10-CM | POA: Diagnosis not present

## 2017-02-09 MED ORDER — LIDOCAINE-EPINEPHRINE-TETRACAINE (LET) SOLUTION
3.0000 mL | Freq: Once | NASAL | Status: AC
  Administered 2017-02-09: 3 mL via TOPICAL
  Filled 2017-02-09: qty 3

## 2017-02-09 MED ORDER — LIDOCAINE-EPINEPHRINE (PF) 2 %-1:200000 IJ SOLN
10.0000 mL | Freq: Once | INTRAMUSCULAR | Status: AC
  Administered 2017-02-09: 10 mL
  Filled 2017-02-09: qty 20

## 2017-02-09 MED ORDER — BACITRACIN ZINC 500 UNIT/GM EX OINT
1.0000 "application " | TOPICAL_OINTMENT | Freq: Once | CUTANEOUS | Status: AC
  Administered 2017-02-09: 1 via TOPICAL
  Filled 2017-02-09: qty 0.9

## 2017-02-09 NOTE — ED Provider Notes (Signed)
[facility] DEPT Provider Note   CSN: [id number] Arrival date & time: 02/09/17  1150    History   Chief Complaint Chief Complaint  Patient presents with  . Head Injury    HPI [name] is a 71 y.o. female.  HPI Pt was walking this am and tripped over a parking curb.  She fell forward and hit her head on the ground.   She did not lose consciousness.  She sustained a laceration over her right eye.  Pt also is having pain in the left shoulder.  She was able to get up and walk.      Past Medical History:  Diagnosis Date  . Anxiety   . Arthritis   . Glaucoma     Patient Active Problem List   Diagnosis Date Noted  . Lumbar stenosis with neurogenic claudication 11/28/2015  . Arthritis of knee, degenerative 02/07/2014  . Knee contusion 06/20/2013  . Foot sprain 06/20/2013  . Neck sprain 06/20/2013  . DDD (degenerative disc disease), lumbosacral 01/11/2013  . Lumbar spondylosis with myelopathy 01/11/2013  . Sciatica 01/11/2013  . Status post right knee replacement 01/11/2013  . DJD (degenerative joint disease), ankle and foot 09/15/2011  . OA (osteoarthritis) of knee 09/15/2011  . S/P total knee replacement 09/15/2011  . ARTHRITIS, RIGHT FOOT 05/18/2010  . SHOULDER PAIN, LEFT 07/03/2008  . TOTAL KNEE FOLLOW-UP 02/06/2008  . DEGENERATIVE JOINT DISEASE, KNEE 07/26/2007    Past Surgical History:  Procedure Laterality Date  . APPENDECTOMY    . JOINT REPLACEMENT     both knees 2009 and shoulder reconstruction 2006  . KNEE ARTHROPLASTY Right   . KNEE ARTHROPLASTY Left   . LUMBAR LAMINECTOMY/DECOMPRESSION MICRODISCECTOMY N/A 11/28/2015   Procedure: LUMBAR LAMINECTOMY/ DECOMPRESSION MICRODISCECTOMY LUMBAR TWO-THREE, LUMBAR THREE-FOUR  ;  Surgeon: Ashok Pall, MD;  Location: [facility];  Service: Neurosurgery;  Laterality: N/A;  . PARTIAL HYSTERECTOMY    . TONSILECTOMY, ADENOIDECTOMY, BILATERAL MYRINGOTOMY AND TUBES      OB History    No data available        Home Medications    Prior to Admission medications   Medication Sig Start Date End Date Taking? Authorizing Provider  ASTAXANTHIN PO Take 1 tablet by mouth daily.   Yes [provider]  Biotin w/ Vitamins C & E (HAIR/SKIN/NAILS PO) Take 1 tablet by mouth daily.   Yes [provider]  buPROPion (WELLBUTRIN XL) 300 MG 24 hr tablet Take 300 mg by mouth daily.   Yes [provider]  celecoxib (CELEBREX) 100 MG capsule Take 1 capsule (100 mg total) by mouth 2 (two) times daily. 08/11/16  Yes Carole Civil, MD  Cholecalciferol (VITAMIN D-3 PO) Take 2,500 Units by mouth daily.    Yes [provider]  CINNAMON PO Take 350 mg by mouth daily.    Yes [provider]  Coenzyme Q10 (COQ10) 200 MG CAPS Take 200 mg by mouth daily.    Yes [provider]  cyanocobalamin 500 MCG tablet Take 500 mcg by mouth daily.   Yes [provider]  gabapentin (NEURONTIN) 100 MG capsule Take 1 capsule (100 mg total) by mouth 3 (three) times daily. 02/17/16  Yes Carole Civil, MD  HYDROcodone-acetaminophen (NORCO) 7.5-325 MG tablet Take 1 tablet by mouth every 4 (four) hours as needed for moderate pain. 02/17/16  Yes Carole Civil, MD  LORazepam (ATIVAN) 0.5 MG tablet Take 0.5 mg by mouth 2 (two) times daily as needed for  anxiety.   Yes [provider]  Omega-3 Fatty Acids (OMEGA 3 PO) Take 2,000 mg by mouth daily.    Yes [provider]  OVER THE COUNTER MEDICATION Take 1 tablet by mouth daily.   Yes [provider]  OVER THE COUNTER MEDICATION Take 1 tablet by mouth daily.   Yes [provider]  Propylene Glycol (SYSTANE BALANCE) 0.6 % SOLN Place 1 drop into both eyes daily as needed (for dry eyes).   Yes [provider]  Resveratrol 100 MG CAPS Take 100 mg by mouth daily.   Yes [provider]  tiZANidine (ZANAFLEX) 4 MG capsule Take 4 mg by mouth 3 (three) times daily.   Yes [provider]  TRAVATAN Z 0.004 % SOLN ophthalmic solution Place 1 drop into both eyes at bedtime.  08/28/11  Yes [provider]  triamterene-hydrochlorothiazide (MAXZIDE-25) 37.5-25 MG per tablet Take 1 tablet by mouth daily as needed (for fluid).  07/28/11  Yes [provider]  TURMERIC PO Take 200 mg by mouth daily.   Yes [provider]  cephALEXin (KEFLEX) 500 MG capsule Take 4 capsules by mouth 30 prior to dental procedure Patient not taking: Reported on 02/09/2017 07/05/16   Carole Civil, MD  cyclobenzaprine (FLEXERIL) 10 MG tablet Take 1 tablet (10 mg total) by mouth 3 (three) times daily as needed for muscle spasms. Patient not taking: Reported on 02/09/2017 10/13/15   Carole Civil, MD  diclofenac (VOLTAREN) 75 MG EC tablet Take 75 mg by mouth 2 (two) times daily.    [provider]  HYDROcodone-acetaminophen (NORCO/VICODIN) 5-325 MG tablet Take 1-2 tablets by mouth every 4 (four) hours as needed (mild pain). 11/30/15   Kary Kos, MD    Family History History reviewed. No pertinent family history.  Social History Social History  Substance Use Topics  . Smoking status: Never Smoker  . Smokeless tobacco: Never Used  . Alcohol use No     Allergies   Sulfonamide derivatives   Review of Systems Review of Systems  All other systems reviewed and are negative.    Physical Exam Updated Vital Signs BP (!) 150/65 (BP Location: Right Arm)   Pulse 75   Temp 97.9 F (36.6 C) (Oral)   Resp 19   Ht 1.626 m (5\' 4" )   Wt 72.6 kg (160 lb)   SpO2 96%   BMI 27.46 kg/m   Physical Exam  Constitutional: She appears well-developed and well-nourished. No distress.  HENT:  Head: Normocephalic. Head is with laceration.    Right Ear: External ear normal.  Left Ear: External ear normal.  Eyes: Conjunctivae are normal. Right eye exhibits no discharge. Left eye exhibits no discharge. No scleral icterus.  Neck: Neck supple. No tracheal  deviation present.  Cardiovascular: Normal rate, regular rhythm and intact distal pulses.   Pulmonary/Chest: Effort normal and breath sounds normal. No stridor. No respiratory distress. She has no wheezes. She has no rales.  Abdominal: Soft. Bowel sounds are normal. She exhibits no distension. There is no tenderness. There is no rebound and no guarding.  Musculoskeletal: She exhibits no edema.       Right shoulder: She exhibits no tenderness, no bony tenderness and no swelling.       Left shoulder: She exhibits tenderness and bony tenderness. She exhibits no swelling and no deformity.       Right wrist: She exhibits no tenderness, no bony tenderness and no swelling.  Left wrist: She exhibits no tenderness, no bony tenderness and no swelling.       Right hip: She exhibits normal range of motion, no tenderness, no bony tenderness and no swelling.       Left hip: She exhibits normal range of motion, no tenderness and no bony tenderness.       Right ankle: She exhibits no swelling. No tenderness.       Left ankle: She exhibits no swelling. No tenderness.       Cervical back: She exhibits no tenderness, no bony tenderness and no swelling.       Thoracic back: She exhibits no tenderness, no bony tenderness and no swelling.       Lumbar back: She exhibits no tenderness, no bony tenderness and no swelling.  Neurological: She is alert. She has normal strength. No cranial nerve deficit (no facial droop, extraocular movements intact, no slurred speech) or sensory deficit. She exhibits normal muscle tone. She displays no seizure activity. Coordination normal.  Skin: Skin is warm and dry. No rash noted.  Psychiatric: She has a normal mood and affect.  Nursing note and vitals reviewed.    ED Treatments / Results  Labs (all labs ordered are listed, but only abnormal results are displayed) Labs Reviewed - No data to display  EKG  EKG Interpretation None       Radiology Dg Shoulder  Left  Result Date: 02/09/2017 CLINICAL DATA:  Injury. EXAM: LEFT SHOULDER - 2+ VIEW COMPARISON:  No recent prior. FINDINGS: Acromioclavicular and glenohumeral degenerative change. No evidence of fracture or dislocation. No acute bony abnormality. IMPRESSION: Acromioclavicular and glenohumeral degenerative change. No acute bony abnormality. Electronically Signed   By: Marcello Moores  Register   On: 02/09/2017 13:15    Procedures .Marland KitchenLaceration Repair Date/Time: 02/09/2017 2:09 PM Performed by: Dorie Rank Authorized by: Dorie Rank   Consent:    Consent obtained:  Verbal   Consent given by:  Patient   Risks discussed:  Infection, need for additional repair, poor cosmetic result, pain and poor wound healing   Alternatives discussed:  No treatment Anesthesia (see MAR for exact dosages):    Anesthesia method:  Local infiltration and topical application   Topical anesthetic:  LET   Local anesthetic:  Lidocaine 2% WITH epi Laceration details:    Location:  Face   Face location:  Forehead   Length (cm):  3   Depth (mm):  3 Repair type:    Repair type:  Intermediate Pre-procedure details:    Preparation:  Patient was prepped and draped in usual sterile fashion Exploration:    Wound exploration: entire depth of wound probed and visualized     Wound extent: no foreign bodies/material noted, no nerve damage noted, no tendon damage noted, no underlying fracture noted and no vascular damage noted     Contaminated: no   Treatment:    Area cleansed with:  Hibiclens   Amount of cleaning:  Standard   Irrigation solution: wound cleanser.   Visualized foreign bodies/material removed: no   Subcutaneous repair:    Suture size:  4-0   Suture material:  Fast-absorbing gut   Number of sutures:  1 Skin repair:    Repair method:  Sutures   Suture size:  6-0   Suture material:  Nylon   Suture technique:  Simple interrupted   Number of sutures:  7 Approximation:    Approximation:  Close   Vermilion border:  well-aligned   Post-procedure details:  Patient tolerance of procedure:  Tolerated well, no immediate complications   (including critical care time)  Medications Ordered in ED Medications  bacitracin ointment 1 application (not administered)  lidocaine-EPINEPHrine (XYLOCAINE W/EPI) 2 %-1:200000 (PF) injection 10 mL (10 mLs Infiltration Given by Other 02/09/17 1322)  lidocaine-EPINEPHrine-tetracaine (LET) solution (3 mLs Topical Given 02/09/17 1322)     Initial Impression / Assessment and Plan / ED Course  I have reviewed the triage vital signs and the nursing notes.  Pertinent labs & imaging results that were available during my care of the patient were reviewed by me and considered in my medical decision making (see chart for details).    Patient presented to the emergency room after a mechanical fall. No loss of consciousness. No headache. Patient does not take any blood thinning agents. I did not feel that head CT was indicated.  Laceration was repaired without difficulty.  Shoulder injury without evidence of fracture or dislocation on x-ray. Most likely joint sprain. Patient did not want a sling. Discussed outpatient follow-up with suture removal in 5 days.  Final Clinical Impressions(s) / ED Diagnoses   Final diagnoses:  Forehead laceration, initial encounter  Sprain of left shoulder, unspecified shoulder sprain type, initial encounter  Fall, initial encounter    New Prescriptions New Prescriptions   No medications on file     Dorie Rank, MD 02/09/17 [id number]

## 2017-02-09 NOTE — ED Notes (Signed)
Patient transported to X-ray 

## 2017-02-09 NOTE — Discharge Instructions (Signed)
Suture removal in 5 days, apply antibiotic ointment to the wound and change the dressing daily, monitor for severe headache, vomiting, other concerns.  Follow up with your doctor if your shoulder injury does not improve over the next week or so.  Apply ice and take over the counter medications as needed for pain

## 2017-02-09 NOTE — ED Notes (Signed)
ED Provider at bedside. 

## 2017-02-09 NOTE — ED Triage Notes (Addendum)
Patient tripped over concerte landing face first onto asphalt. Patient has large v shaped laceration above left eyebrow. No active bleeding. Denies LOC, dizziness, blurred vision, nausea, or vomiting. Denies taking any blood thinners. Patient believes last tetanus vaccination within 5 years but unsure. Patient also c/o left shoulder pain from fall.

## 2017-02-14 ENCOUNTER — Ambulatory Visit: Payer: Medicare Other | Admitting: Orthopedic Surgery

## 2017-02-14 DIAGNOSIS — I10 Essential (primary) hypertension: Secondary | ICD-10-CM | POA: Diagnosis not present

## 2017-02-14 DIAGNOSIS — Z4802 Encounter for removal of sutures: Secondary | ICD-10-CM | POA: Diagnosis not present

## 2017-02-14 DIAGNOSIS — Z6830 Body mass index (BMI) 30.0-30.9, adult: Secondary | ICD-10-CM | POA: Diagnosis not present

## 2017-02-14 DIAGNOSIS — S01119A Laceration without foreign body of unspecified eyelid and periocular area, initial encounter: Secondary | ICD-10-CM | POA: Diagnosis not present

## 2017-02-14 DIAGNOSIS — W19XXXA Unspecified fall, initial encounter: Secondary | ICD-10-CM | POA: Diagnosis not present

## 2017-02-14 DIAGNOSIS — Z299 Encounter for prophylactic measures, unspecified: Secondary | ICD-10-CM | POA: Diagnosis not present

## 2017-02-15 ENCOUNTER — Ambulatory Visit (INDEPENDENT_AMBULATORY_CARE_PROVIDER_SITE_OTHER): Payer: Medicare Other

## 2017-02-15 ENCOUNTER — Encounter: Payer: Self-pay | Admitting: Orthopedic Surgery

## 2017-02-15 ENCOUNTER — Ambulatory Visit (INDEPENDENT_AMBULATORY_CARE_PROVIDER_SITE_OTHER): Payer: Medicare Other | Admitting: Orthopedic Surgery

## 2017-02-15 ENCOUNTER — Ambulatory Visit: Payer: Medicare Other

## 2017-02-15 VITALS — BP 150/71 | HR 64 | Wt 175.0 lb

## 2017-02-15 DIAGNOSIS — M25561 Pain in right knee: Secondary | ICD-10-CM

## 2017-02-15 DIAGNOSIS — M25562 Pain in left knee: Secondary | ICD-10-CM | POA: Diagnosis not present

## 2017-02-15 DIAGNOSIS — S8000XA Contusion of unspecified knee, initial encounter: Secondary | ICD-10-CM | POA: Diagnosis not present

## 2017-02-15 MED ORDER — HYDROCODONE-ACETAMINOPHEN 7.5-325 MG PO TABS: 1.0000 | ORAL_TABLET | ORAL | 0 refills | Status: DC | PRN

## 2017-02-15 NOTE — Progress Notes (Signed)
Patient ID: [name], female   DOB: 04/01/1946, 71 y.o.   MRN: [id number]  Chief Complaint  Patient presents with  . Knee Pain    bilateral knee pain s/p fall, DOI 02/09/17    HPI [name] is a 71 y.o. female.    71 year old female had bilateral total knees back in 2009 presents for evaluation of bilateral knee pain status post fall on June 20  She simply tripped over a parking concrete barrier landed on both knees and over the left forehead  She complains of new onset dull constant aching pain right worse than left knee bilateral nonetheless    Review of Systems Review of Systems  Constitutional:       Headache no blurred vision no light sensitivity no nausea no vomiting  Skin:       Laceration left over the left eye   (2 MINIMUM)  Past Medical History:  Diagnosis Date  . Anxiety   . Arthritis   . Glaucoma     Past Surgical History:  Procedure Laterality Date  . APPENDECTOMY    . JOINT REPLACEMENT     both knees 2009 and shoulder reconstruction 2006  . KNEE ARTHROPLASTY Right   . KNEE ARTHROPLASTY Left   . LUMBAR LAMINECTOMY/DECOMPRESSION MICRODISCECTOMY N/A 11/28/2015   Procedure: LUMBAR LAMINECTOMY/ DECOMPRESSION MICRODISCECTOMY LUMBAR TWO-THREE, LUMBAR THREE-FOUR  ;  Surgeon: Ashok Pall, MD;  Location: [facility];  Service: Neurosurgery;  Laterality: N/A;  . PARTIAL HYSTERECTOMY    . TONSILECTOMY, ADENOIDECTOMY, BILATERAL MYRINGOTOMY AND TUBES      Social History Social History  Substance Use Topics  . Smoking status: Never Smoker  . Smokeless tobacco: Never Used  . Alcohol use No    Allergies  Allergen Reactions  . Sulfonamide Derivatives Itching and Swelling    Redness    Current Meds  Medication Sig  . ASTAXANTHIN PO Take 1 tablet by mouth daily.  . Biotin w/ Vitamins C & E (HAIR/SKIN/NAILS PO) Take 1 tablet by mouth daily.  Marland Kitchen buPROPion (WELLBUTRIN XL) 300 MG 24 hr tablet Take 300 mg by mouth daily.  . celecoxib  (CELEBREX) 100 MG capsule Take 1 capsule (100 mg total) by mouth 2 (two) times daily.  . cephALEXin (KEFLEX) 500 MG capsule Take 4 capsules by mouth 30 prior to dental procedure  . Cholecalciferol (VITAMIN D-3 PO) Take 2,500 Units by mouth daily.   Marland Kitchen CINNAMON PO Take 350 mg by mouth daily.   . Coenzyme Q10 (COQ10) 200 MG CAPS Take 200 mg by mouth daily.   . cyanocobalamin 500 MCG tablet Take 500 mcg by mouth daily.  . cyclobenzaprine (FLEXERIL) 10 MG tablet Take 1 tablet (10 mg total) by mouth 3 (three) times daily as needed for muscle spasms.  . diclofenac (VOLTAREN) 75 MG EC tablet Take 75 mg by mouth 2 (two) times daily.  Marland Kitchen gabapentin (NEURONTIN) 100 MG capsule Take 1 capsule (100 mg total) by mouth 3 (three) times daily.  Marland Kitchen HYDROcodone-acetaminophen (NORCO) 7.5-325 MG tablet Take 1 tablet by mouth every 4 (four) hours as needed for moderate pain.  Marland Kitchen LORazepam (ATIVAN) 0.5 MG tablet Take 0.5 mg by mouth 2 (two) times daily as needed for anxiety.  . Omega-3 Fatty Acids (OMEGA 3 PO) Take 2,000 mg by mouth daily.   Marland Kitchen OVER THE COUNTER MEDICATION Take 1 tablet by mouth daily.  Marland Kitchen OVER THE COUNTER MEDICATION Take 1 tablet by mouth daily.  Marland Kitchen Propylene Glycol (SYSTANE BALANCE) 0.6 %  SOLN Place 1 drop into both eyes daily as needed (for dry eyes).  Marland Kitchen Resveratrol 100 MG CAPS Take 100 mg by mouth daily.  Marland Kitchen tiZANidine (ZANAFLEX) 4 MG capsule Take 4 mg by mouth 3 (three) times daily.  . TRAVATAN Z 0.004 % SOLN ophthalmic solution Place 1 drop into both eyes at bedtime.   . triamterene-hydrochlorothiazide (MAXZIDE-25) 37.5-25 MG per tablet Take 1 tablet by mouth daily as needed (for fluid).   . TURMERIC PO Take 200 mg by mouth daily.      Physical Exam Physical Exam 1.BP (!) 150/71   Pulse 64   Wt 175 lb (79.4 kg)   BMI 30.04 kg/m   2. Gen. appearance. The patient is well-developed and well-nourished, grooming and hygiene are normal. There are no gross congenital abnormalities  3. The patient  is alert and oriented to person place and time  4. Mood and affect are normal  5. AmbulationNormal   Examination reveals the following:  Right knee 6. On inspection we find bruise over the right knee but full extension  7. With the range of motion of  flexion 120  8. Stability tests were normal  in all planes  9. Strength tests revealed grade 5 motor strength  10. Skin we find no rash ulceration or erythema  11. Sensation remains intact  12 Vascular system shows no peripheral edema  Left knee full extension and flexion stable in all planes to varus valgus and anterior posterior drawer testing  Small bruise    MEDICAL DECISION MAKING:    Data Reviewed X-rays both knees show stable prosthesis no fracture  Assessment Encounter Diagnoses  Name Primary?  . Acute pain of right knee   . Acute pain of left knee   . Contusion of knee, unspecified laterality, initial encounter Yes   Meds ordered this encounter  Medications  . HYDROcodone-acetaminophen (NORCO) 7.5-325 MG tablet    Sig: Take 1 tablet by mouth every 4 (four) hours as needed for moderate pain.    Dispense:  42 tablet    Refill:  0     Plan Resolution in 6 weeks use ice or heat as needed  Arther Abbott 02/15/2017, 3:38 PM

## 2017-02-25 DIAGNOSIS — Z299 Encounter for prophylactic measures, unspecified: Secondary | ICD-10-CM | POA: Diagnosis not present

## 2017-02-25 DIAGNOSIS — Z7189 Other specified counseling: Secondary | ICD-10-CM | POA: Diagnosis not present

## 2017-02-25 DIAGNOSIS — E785 Hyperlipidemia, unspecified: Secondary | ICD-10-CM | POA: Diagnosis not present

## 2017-02-25 DIAGNOSIS — Z1389 Encounter for screening for other disorder: Secondary | ICD-10-CM | POA: Diagnosis not present

## 2017-02-25 DIAGNOSIS — I10 Essential (primary) hypertension: Secondary | ICD-10-CM | POA: Diagnosis not present

## 2017-02-25 DIAGNOSIS — F419 Anxiety disorder, unspecified: Secondary | ICD-10-CM | POA: Diagnosis not present

## 2017-02-25 DIAGNOSIS — Z79899 Other long term (current) drug therapy: Secondary | ICD-10-CM | POA: Diagnosis not present

## 2017-02-25 DIAGNOSIS — F329 Major depressive disorder, single episode, unspecified: Secondary | ICD-10-CM | POA: Diagnosis not present

## 2017-02-25 DIAGNOSIS — Z6830 Body mass index (BMI) 30.0-30.9, adult: Secondary | ICD-10-CM | POA: Diagnosis not present

## 2017-02-25 DIAGNOSIS — R5383 Other fatigue: Secondary | ICD-10-CM | POA: Diagnosis not present

## 2017-02-25 DIAGNOSIS — Z1211 Encounter for screening for malignant neoplasm of colon: Secondary | ICD-10-CM | POA: Diagnosis not present

## 2017-02-25 DIAGNOSIS — Z0000 Encounter for general adult medical examination without abnormal findings: Secondary | ICD-10-CM | POA: Diagnosis not present

## 2017-04-13 ENCOUNTER — Encounter (INDEPENDENT_AMBULATORY_CARE_PROVIDER_SITE_OTHER): Payer: Self-pay | Admitting: Internal Medicine

## 2017-04-13 ENCOUNTER — Encounter (INDEPENDENT_AMBULATORY_CARE_PROVIDER_SITE_OTHER): Payer: Self-pay

## 2017-04-14 ENCOUNTER — Encounter: Payer: Self-pay | Admitting: Gastroenterology

## 2017-05-05 ENCOUNTER — Ambulatory Visit (INDEPENDENT_AMBULATORY_CARE_PROVIDER_SITE_OTHER): Payer: Medicare Other | Admitting: Internal Medicine

## 2017-05-25 ENCOUNTER — Ambulatory Visit: Payer: Medicare Other

## 2017-05-25 VITALS — Ht 62.0 in | Wt 181.0 lb

## 2017-05-25 DIAGNOSIS — Z1211 Encounter for screening for malignant neoplasm of colon: Secondary | ICD-10-CM

## 2017-05-25 MED ORDER — SUPREP BOWEL PREP KIT 17.5-3.13-1.6 GM/177ML PO SOLN: 1.0000 | Freq: Once | ORAL | 0 refills | Status: AC

## 2017-05-25 NOTE — Progress Notes (Signed)
No allergies to eggs or soy No diet meds No past problems with anesthesia  No home oxygen  Declined emmi 

## 2017-05-27 DIAGNOSIS — F419 Anxiety disorder, unspecified: Secondary | ICD-10-CM | POA: Diagnosis not present

## 2017-05-27 DIAGNOSIS — E669 Obesity, unspecified: Secondary | ICD-10-CM | POA: Diagnosis not present

## 2017-05-27 DIAGNOSIS — Z299 Encounter for prophylactic measures, unspecified: Secondary | ICD-10-CM | POA: Diagnosis not present

## 2017-05-27 DIAGNOSIS — I10 Essential (primary) hypertension: Secondary | ICD-10-CM | POA: Diagnosis not present

## 2017-05-27 DIAGNOSIS — E785 Hyperlipidemia, unspecified: Secondary | ICD-10-CM | POA: Diagnosis not present

## 2017-05-27 DIAGNOSIS — J329 Chronic sinusitis, unspecified: Secondary | ICD-10-CM | POA: Diagnosis not present

## 2017-05-27 DIAGNOSIS — F329 Major depressive disorder, single episode, unspecified: Secondary | ICD-10-CM | POA: Diagnosis not present

## 2017-06-01 DIAGNOSIS — I10 Essential (primary) hypertension: Secondary | ICD-10-CM | POA: Diagnosis not present

## 2017-06-01 DIAGNOSIS — Z789 Other specified health status: Secondary | ICD-10-CM | POA: Diagnosis not present

## 2017-06-01 DIAGNOSIS — R05 Cough: Secondary | ICD-10-CM | POA: Diagnosis not present

## 2017-06-01 DIAGNOSIS — Z6831 Body mass index (BMI) 31.0-31.9, adult: Secondary | ICD-10-CM | POA: Diagnosis not present

## 2017-06-01 DIAGNOSIS — Z299 Encounter for prophylactic measures, unspecified: Secondary | ICD-10-CM | POA: Diagnosis not present

## 2017-06-08 ENCOUNTER — Encounter: Payer: Self-pay | Admitting: Gastroenterology

## 2017-06-08 ENCOUNTER — Ambulatory Visit (AMBULATORY_SURGERY_CENTER): Payer: Medicare Other | Admitting: Gastroenterology

## 2017-06-08 VITALS — BP 134/73 | HR 55 | Temp 98.0°F | Resp 28 | Ht 62.0 in | Wt 181.0 lb

## 2017-06-08 DIAGNOSIS — Z1212 Encounter for screening for malignant neoplasm of rectum: Secondary | ICD-10-CM

## 2017-06-08 DIAGNOSIS — Z1211 Encounter for screening for malignant neoplasm of colon: Secondary | ICD-10-CM

## 2017-06-08 DIAGNOSIS — F419 Anxiety disorder, unspecified: Secondary | ICD-10-CM | POA: Diagnosis not present

## 2017-06-08 MED ORDER — SODIUM CHLORIDE 0.9 % IV SOLN: 500.0000 mL | INTRAVENOUS | Status: DC

## 2017-06-08 NOTE — Op Note (Signed)
[facility] Patient Name: Shermaine Rivet Procedure Date: 06/08/2017 8:52 AM MRN: [id number] Endoscopist: Mauri Pole , MD Age: 71 Referring MD:  Date of Birth: 1946-06-29 Gender: Female Account #: [id number] Procedure:                Colonoscopy Indications:              Screening for colorectal malignant neoplasm Medicines:                Monitored Anesthesia Care Procedure:                Pre-Anesthesia Assessment:                           - Prior to the procedure, a History and Physical                            was performed, and patient medications and                            allergies were reviewed. The patient's tolerance of                            previous anesthesia was also reviewed. The risks                            and benefits of the procedure and the sedation                            options and risks were discussed with the patient.                            All questions were answered, and informed consent                            was obtained. Prior Anticoagulants: The patient has                            taken no previous anticoagulant or antiplatelet                            agents. ASA Grade Assessment: I - A normal, healthy                            patient. After reviewing the risks and benefits,                            the patient was deemed in satisfactory condition to                            undergo the procedure.                           After obtaining informed consent, the colonoscope  was passed under direct vision. Throughout the                            procedure, the patient's blood pressure, pulse, and                            oxygen saturations were monitored continuously. The                            Colonoscope was introduced through the anus and                            advanced to the the terminal ileum, with                            identification of the  appendiceal orifice and IC                            valve. The colonoscopy was performed without                            difficulty. The patient tolerated the procedure                            well. The quality of the bowel preparation was                            adequate. The terminal ileum, ileocecal valve,                            appendiceal orifice, and rectum were photographed. Scope In: 8:53:59 AM Scope Out: 9:11:39 AM Total Procedure Duration: 0 hours 17 minutes 40 seconds  Findings:                 The perianal and digital rectal examinations were                            normal.                           A few small-mouthed diverticula were found in the                            sigmoid colon.                           Non-bleeding internal hemorrhoids were found during                            retroflexion. The hemorrhoids were small.                           The exam was otherwise without abnormality. Complications:            No immediate complications. Estimated Blood Loss:     Estimated blood loss: none. Impression:               -  Diverticulosis in the sigmoid colon.                           - Non-bleeding internal hemorrhoids.                           - The examination was otherwise normal.                           - No specimens collected. Recommendation:           - Patient has a contact number available for                            emergencies. The signs and symptoms of potential                            delayed complications were discussed with the                            patient. Return to normal activities tomorrow.                            Written discharge instructions were provided to the                            patient.                           - Resume previous diet.                           - Continue present medications.                           - Repeat colonoscopy in 10 years for screening                             purposes. Mauri Pole, MD 06/08/2017 9:15:25 AM This report has been signed electronically.

## 2017-06-08 NOTE — Progress Notes (Signed)
Report given to PACU, vss 

## 2017-06-08 NOTE — Patient Instructions (Addendum)
YOU HAD AN ENDOSCOPIC PROCEDURE TODAY AT [facility]:   Refer to the procedure report that was given to you for any specific questions about what was found during the examination.  If the procedure report does not answer your questions, please call your gastroenterologist to clarify.  If you requested that your care partner not be given the details of your procedure findings, then the procedure report has been included in a sealed envelope for you to review at your convenience later.  YOU SHOULD EXPECT: Some feelings of bloating in the abdomen. Passage of more gas than usual.  Walking can help get rid of the air that was put into your GI tract during the procedure and reduce the bloating. If you had a lower endoscopy (such as a colonoscopy or flexible sigmoidoscopy) you may notice spotting of blood in your stool or on the toilet paper. If you underwent a bowel prep for your procedure, you may not have a normal bowel movement for a few days.  Please Note:  You might notice some irritation and congestion in your nose or some drainage.  This is from the oxygen used during your procedure.  There is no need for concern and it should clear up in a day or so.  SYMPTOMS TO REPORT IMMEDIATELY:   Following lower endoscopy (colonoscopy or flexible sigmoidoscopy):  Excessive amounts of blood in the stool  Significant tenderness or worsening of abdominal pains  Swelling of the abdomen that is new, acute  Fever of 100F or higher   For urgent or emergent issues, a gastroenterologist can be reached at any hour by calling [phone].   DIET:  We do recommend a small meal at first, but then you may proceed to your regular diet.  Drink plenty of fluids but you should avoid alcoholic beverages for 24 hours.  MEDICATIONS: Continue present medications. Add Mirilax 1/2 capful daily as per instructions for help with constipation. Also follow High Fiber Diet handout given to you by your recovery  nurse.  Please see handouts given to you by your recovery nurse.  ACTIVITY:  You should plan to take it easy for the rest of today and you should NOT DRIVE or use heavy machinery until tomorrow (because of the sedation medicines used during the test).    FOLLOW UP: Our staff will call the number listed on your records the next business day following your procedure to check on you and address any questions or concerns that you may have regarding the information given to you following your procedure. If we do not reach you, we will leave a message.  However, if you are feeling well and you are not experiencing any problems, there is no need to return our call.  We will assume that you have returned to your regular daily activities without incident.  If any biopsies were taken you will be contacted by phone or by letter within the next 1-3 weeks.  Please call us at [phone] if you have not heard about the biopsies in 3 weeks.   Thank you for allowing [place] to provide for your healthcare needs today.  SIGNATURES/CONFIDENTIALITY: You and/or your care partner have signed paperwork which will be entered into your electronic medical record.  These signatures attest to the fact that that the information above on your After Visit Summary has been reviewed and is understood.  Full responsibility of the confidentiality of this discharge information lies with you and/or your care-partner.

## 2017-06-08 NOTE — Progress Notes (Signed)
Pt's states no medical or surgical changes since previsit or office visit. 

## 2017-06-09 ENCOUNTER — Telehealth: Payer: Self-pay | Admitting: *Deleted

## 2017-06-09 NOTE — Telephone Encounter (Signed)
  Follow up Call-  Call back number 06/08/2017  Post procedure Call Back phone  # [phone]  Permission to leave phone message Yes  Some recent data might be hidden     Patient questions:  Do you have a fever, pain , or abdominal swelling? No. Pain Score  0 *  Have you tolerated food without any problems? Yes.    Have you been able to return to your normal activities? Yes.    Do you have any questions about your discharge instructions: Diet   No. Medications  No. Follow up visit  No.  Do you have questions or concerns about your Care? No.  Actions: * If pain score is 4 or above: No action needed, pain <4.

## 2017-06-16 DIAGNOSIS — H401131 Primary open-angle glaucoma, bilateral, mild stage: Secondary | ICD-10-CM | POA: Diagnosis not present

## 2017-12-15 ENCOUNTER — Other Ambulatory Visit: Payer: Self-pay | Admitting: Radiology

## 2017-12-15 NOTE — Telephone Encounter (Signed)
Refill request received via fax for Celebrex 100mg  bid,  [facility]  Last visit with you was 01/2017

## 2017-12-16 MED ORDER — CELECOXIB 100 MG PO CAPS: 100.0000 mg | ORAL_CAPSULE | Freq: Two times a day (BID) | ORAL | 3 refills | Status: DC

## 2017-12-19 ENCOUNTER — Telehealth: Payer: Self-pay | Admitting: Radiology

## 2017-12-19 NOTE — Telephone Encounter (Signed)
Prior auth for Celebrex

## 2017-12-19 NOTE — Telephone Encounter (Signed)
Completed prior auth request form  Advised patient also her Handicapped placard application is ready for pick up

## 2018-03-03 DIAGNOSIS — Z1339 Encounter for screening examination for other mental health and behavioral disorders: Secondary | ICD-10-CM | POA: Diagnosis not present

## 2018-03-03 DIAGNOSIS — Z6829 Body mass index (BMI) 29.0-29.9, adult: Secondary | ICD-10-CM | POA: Diagnosis not present

## 2018-03-03 DIAGNOSIS — I10 Essential (primary) hypertension: Secondary | ICD-10-CM | POA: Diagnosis not present

## 2018-03-03 DIAGNOSIS — Z1331 Encounter for screening for depression: Secondary | ICD-10-CM | POA: Diagnosis not present

## 2018-03-03 DIAGNOSIS — Z0000 Encounter for general adult medical examination without abnormal findings: Secondary | ICD-10-CM | POA: Diagnosis not present

## 2018-03-03 DIAGNOSIS — Z299 Encounter for prophylactic measures, unspecified: Secondary | ICD-10-CM | POA: Diagnosis not present

## 2018-03-03 DIAGNOSIS — E7800 Pure hypercholesterolemia, unspecified: Secondary | ICD-10-CM | POA: Diagnosis not present

## 2018-03-03 DIAGNOSIS — Z7189 Other specified counseling: Secondary | ICD-10-CM | POA: Diagnosis not present

## 2018-03-03 DIAGNOSIS — R5383 Other fatigue: Secondary | ICD-10-CM | POA: Diagnosis not present

## 2018-03-03 DIAGNOSIS — Z79899 Other long term (current) drug therapy: Secondary | ICD-10-CM | POA: Diagnosis not present

## 2018-03-31 DIAGNOSIS — Z1231 Encounter for screening mammogram for malignant neoplasm of breast: Secondary | ICD-10-CM | POA: Diagnosis not present

## 2018-04-18 IMAGING — MR MR LUMBAR SPINE WO/W CM
4 of 8 series · 14 of 48 positions shown · IV contrast (multihance)
Comparison: Lumbar MRI 10/23/2005 that

CLINICAL DATA: Lumbar spinal stenosis. Low back pain with right leg
pain. Back surgery 12/08/2015

EXAM:
MRI LUMBAR SPINE WITHOUT AND WITH CONTRAST
TECHNIQUE: Multiplanar and multiecho pulse sequences of the lumbar spine were
obtained without and with intravenous contrast.
CONTRAST:  16mL MULTIHANCE GADOBENATE DIMEGLUMINE 529 MG/ML IV SOLN

[Series 5: T1 · sagittal · 4.0mm · 0.43mm/px · 3 of 15 slices shown (1 of 2)]
[im 1/15]
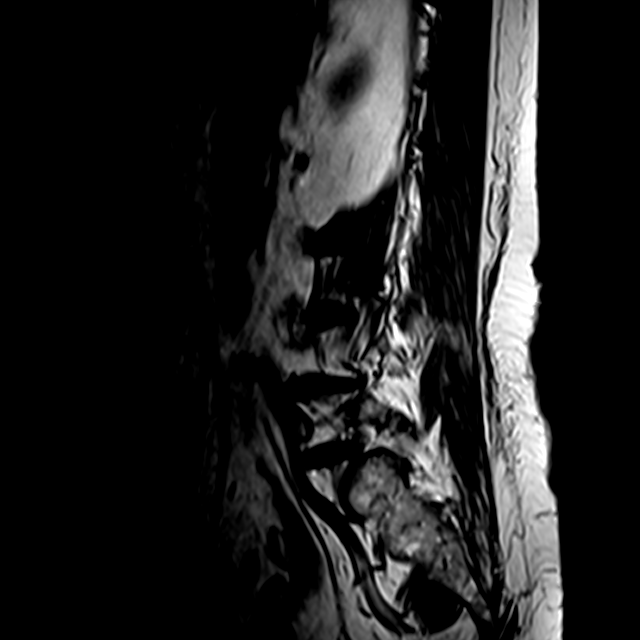
[im 8/15]
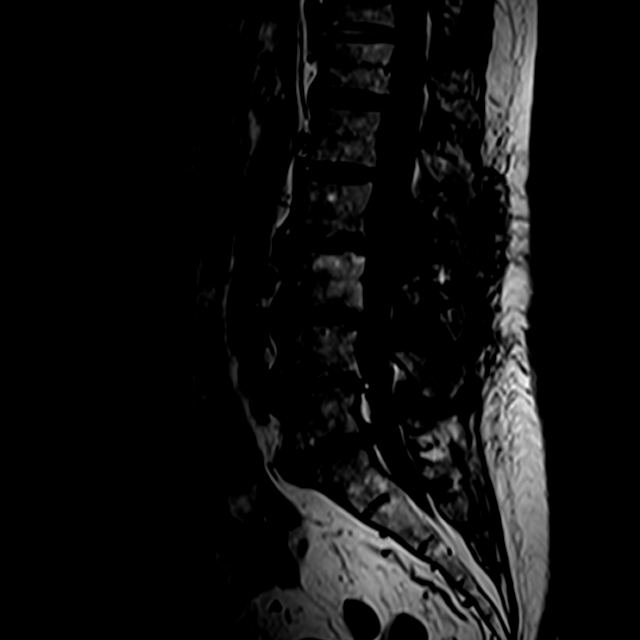
[im 15/15]
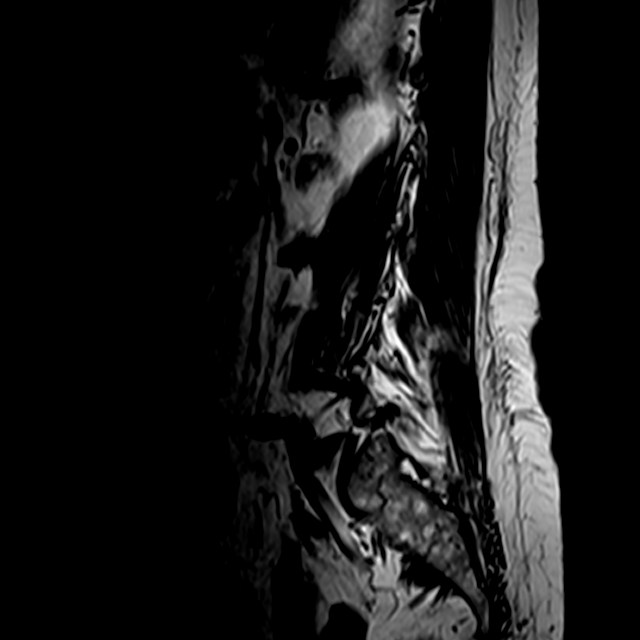

[Series 7: T2 · axial · 4.0mm · 0.29mm/px · z∈[-74,+104]mm · 5 of 39 slices shown (1 of 2)]
[im 1/39]
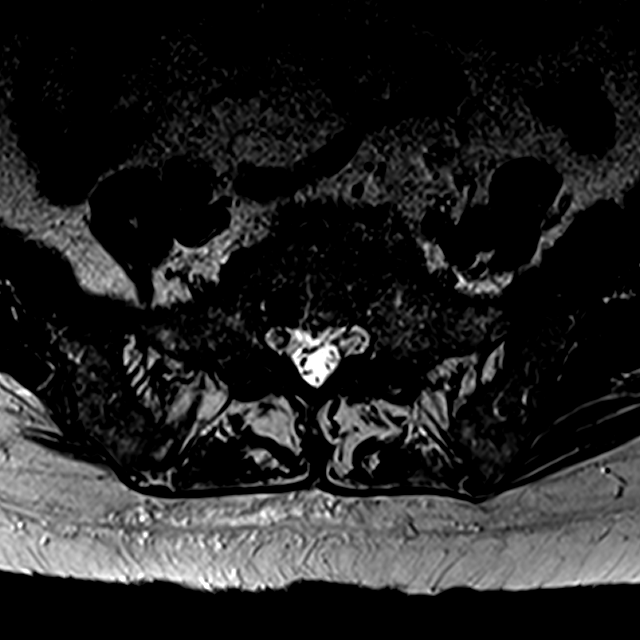
[im 5/39]
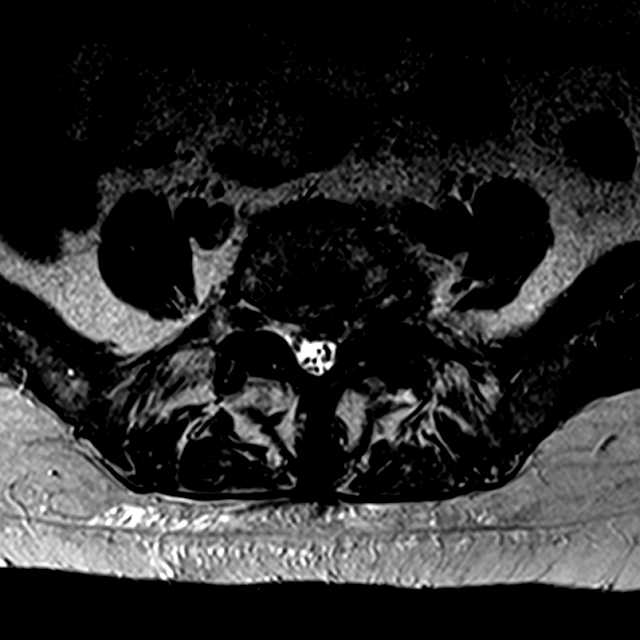
[im 13/39]
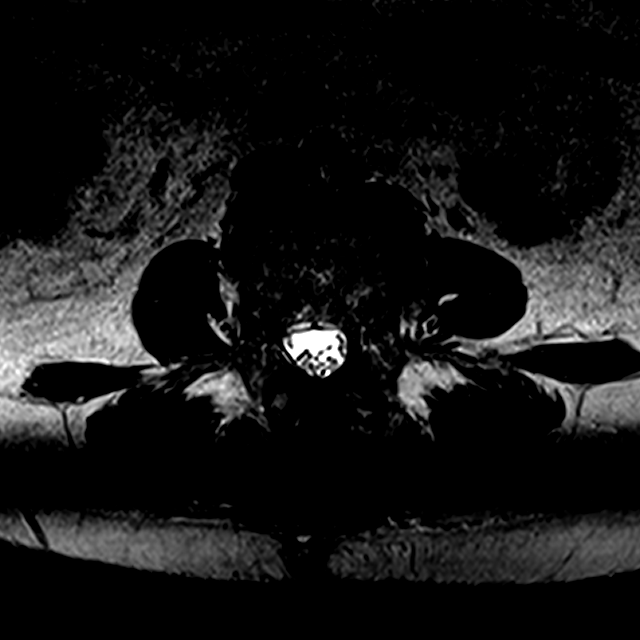
[im 22/39]
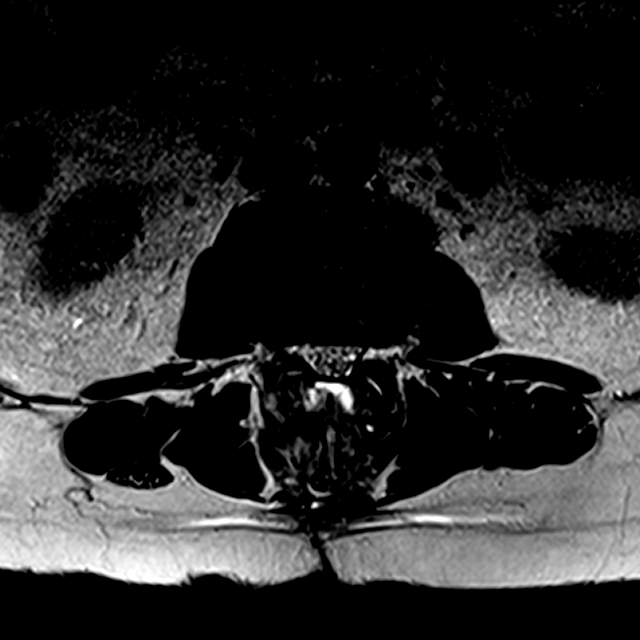
[im 34/39]
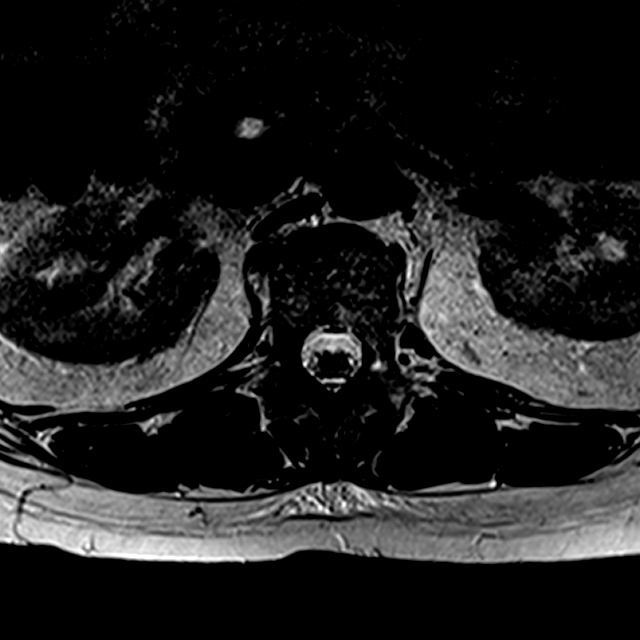

[Series 8: T1 · axial · 4.0mm · 0.29mm/px · z∈[-56,+104]mm · 3 of 39 slices shown (2 of 2)]
[im 5/39]
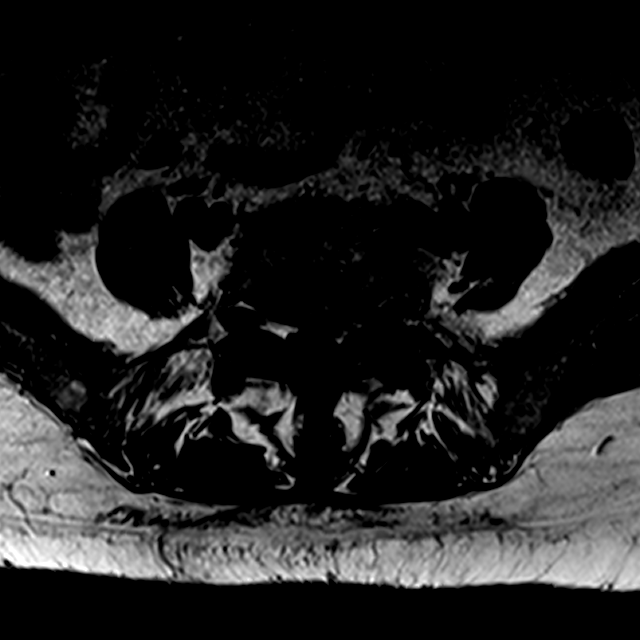
[im 22/39]
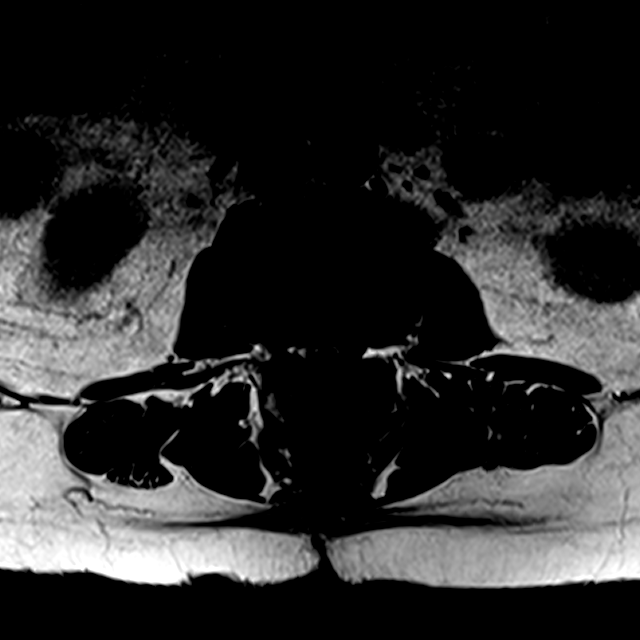
[im 34/39]
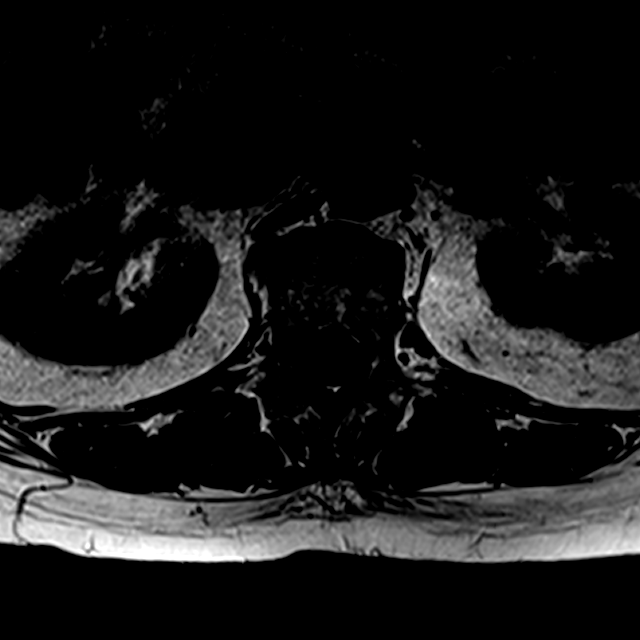

[Series 9: T2 · sagittal · 4.0mm · 0.72mm/px · 3 of 15 slices shown (2 of 2)]
[im 1/15]
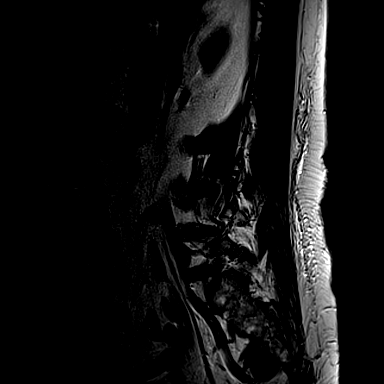
[im 10/15]
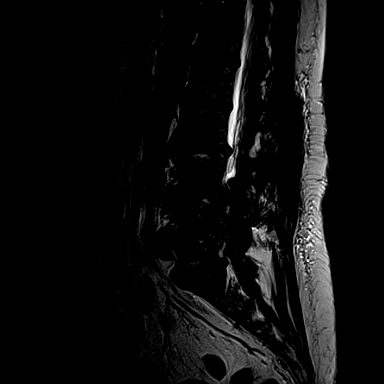
[im 15/15]
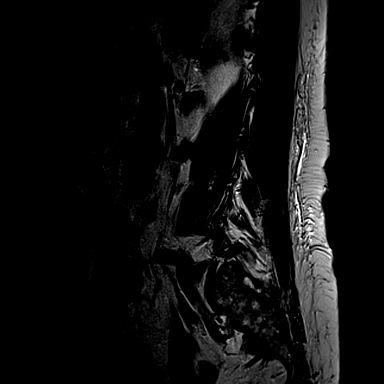

[14 of 48 positions shown; findings below may reference images not displayed]

FINDINGS: Straightening of the lumbar lordosis. Negative for fracture or mass.
Postop laminectomy bilaterally L2-3 and L3-4 with typical posterior
soft tissue enhancement. Conus medullaris normal and terminates
L1-2.

T12-L1:  Mild disc bulging

L1-2:  Mild disc bulging

L2-3: 2 mm anterior slip. Moderate to advanced disc degeneration
with disc bulging and spurring. Postop bilateral laminotomy.
Improvement in spinal stenosis which is now mild. No evidence of
epidural abscess or fluid collection

L3-4: 2 mm retrolisthesis. Diffuse disc bulging and spurring.
Posterior laminotomy bilaterally with small posterior fluid
collections, typical for recent surgery. No evidence of epidural
abscess. Moderate foraminal narrowing on the right and severe
foraminal narrowing on the left, unchanged

L4-5: Advanced disc degeneration with diffuse endplate spurring.
Bilateral facet hypertrophy. Moderate spinal stenosis unchanged.
Severe left foraminal encroachment with impingement of the left L4
nerve root unchanged. Mild right foraminal narrowing

L5-S1: Disc degeneration and spondylosis. Bilateral facet
hypertrophy. Moderate foraminal narrowing bilaterally.
IMPRESSION: Postop bilateral laminotomy L2-3 with improvement in spinal
stenosis. Typical soft tissue enhancement.

Postop bilateral laminotomy at L3-4 with small posterior fluid
collection bilaterally, typical of recent surgery. Moderate right
foraminal narrowing and severe left foraminal narrowing unchanged

Severe left foraminal encroachment L4-5 is unchanged. Mild right
foraminal narrowing L4-5.

Moderate foraminal narrowing bilaterally L5-S1 unchanged.

## 2018-09-21 DIAGNOSIS — H5203 Hypermetropia, bilateral: Secondary | ICD-10-CM | POA: Diagnosis not present

## 2018-09-21 DIAGNOSIS — H52223 Regular astigmatism, bilateral: Secondary | ICD-10-CM | POA: Diagnosis not present

## 2018-09-21 DIAGNOSIS — H524 Presbyopia: Secondary | ICD-10-CM | POA: Diagnosis not present

## 2018-09-21 DIAGNOSIS — H401131 Primary open-angle glaucoma, bilateral, mild stage: Secondary | ICD-10-CM | POA: Diagnosis not present

## 2018-09-30 ENCOUNTER — Encounter (HOSPITAL_COMMUNITY): Payer: Self-pay

## 2018-09-30 ENCOUNTER — Emergency Department (HOSPITAL_COMMUNITY): Payer: Medicare Other

## 2018-09-30 ENCOUNTER — Other Ambulatory Visit: Payer: Self-pay

## 2018-09-30 ENCOUNTER — Emergency Department (HOSPITAL_COMMUNITY)
Admission: EM | Admit: 2018-09-30 | Discharge: 2018-10-01 | Disposition: A | Discharge status: Home / Self Care | Payer: Medicare Other | Attending: Emergency Medicine | Admitting: Emergency Medicine

## 2018-09-30 DIAGNOSIS — I10 Essential (primary) hypertension: Secondary | ICD-10-CM | POA: Insufficient documentation

## 2018-09-30 DIAGNOSIS — N390 Urinary tract infection, site not specified: Secondary | ICD-10-CM | POA: Diagnosis not present

## 2018-09-30 DIAGNOSIS — Z79899 Other long term (current) drug therapy: Secondary | ICD-10-CM | POA: Insufficient documentation

## 2018-09-30 DIAGNOSIS — R030 Elevated blood-pressure reading, without diagnosis of hypertension: Secondary | ICD-10-CM

## 2018-09-30 DIAGNOSIS — R55 Syncope and collapse: Secondary | ICD-10-CM | POA: Diagnosis present

## 2018-09-30 DIAGNOSIS — W01198A Fall on same level from slipping, tripping and stumbling with subsequent striking against other object, initial encounter: Secondary | ICD-10-CM | POA: Insufficient documentation

## 2018-09-30 DIAGNOSIS — E876 Hypokalemia: Secondary | ICD-10-CM | POA: Insufficient documentation

## 2018-09-30 DIAGNOSIS — R51 Headache: Secondary | ICD-10-CM | POA: Diagnosis not present

## 2018-09-30 DIAGNOSIS — Z96653 Presence of artificial knee joint, bilateral: Secondary | ICD-10-CM | POA: Insufficient documentation

## 2018-09-30 HISTORY — DX: Essential (primary) hypertension: I10

## 2018-09-30 LAB — CBC WITH DIFFERENTIAL/PLATELET
Abs Immature Granulocytes: 0.03 10*3/uL (ref 0.00–0.07)
BASOS ABS: 0 10*3/uL (ref 0.0–0.1)
BASOS PCT: 0 %
Eosinophils Absolute: 0.2 10*3/uL (ref 0.0–0.5)
Eosinophils Relative: 2 %
HEMATOCRIT: 47.2 % — AB (ref 36.0–46.0)
Hemoglobin: 15.8 g/dL — ABNORMAL HIGH (ref 12.0–15.0)
IMMATURE GRANULOCYTES: 0 %
Lymphocytes Relative: 23 %
Lymphs Abs: 2.3 10*3/uL (ref 0.7–4.0)
MCH: 30.6 pg (ref 26.0–34.0)
MCHC: 33.5 g/dL (ref 30.0–36.0)
MCV: 91.3 fL (ref 80.0–100.0)
Monocytes Absolute: 0.6 10*3/uL (ref 0.1–1.0)
Monocytes Relative: 6 %
NEUTROS PCT: 69 %
NRBC: 0 % (ref 0.0–0.2)
Neutro Abs: 6.6 10*3/uL (ref 1.7–7.7)
PLATELETS: 252 10*3/uL (ref 150–400)
RBC: 5.17 MIL/uL — ABNORMAL HIGH (ref 3.87–5.11)
RDW: 12.4 % (ref 11.5–15.5)
WBC: 9.8 10*3/uL (ref 4.0–10.5)

## 2018-09-30 LAB — URINALYSIS, ROUTINE W REFLEX MICROSCOPIC
Bilirubin Urine: NEGATIVE
Glucose, UA: NEGATIVE mg/dL
Hgb urine dipstick: NEGATIVE
KETONES UR: NEGATIVE mg/dL
Nitrite: NEGATIVE
Protein, ur: NEGATIVE mg/dL
Specific Gravity, Urine: 1.025 (ref 1.005–1.030)
pH: 5.5 (ref 5.0–8.0)

## 2018-09-30 LAB — URINALYSIS, MICROSCOPIC (REFLEX)

## 2018-09-30 MED ORDER — CEPHALEXIN 500 MG PO CAPS: 500.0000 mg | ORAL_CAPSULE | Freq: Three times a day (TID) | ORAL | 0 refills | Status: DC

## 2018-09-30 MED ORDER — CEPHALEXIN 500 MG PO CAPS
500.0000 mg | ORAL_CAPSULE | Freq: Once | ORAL | Status: AC
  Administered 2018-09-30: 500 mg via ORAL
  Filled 2018-09-30: qty 1

## 2018-09-30 NOTE — ED Notes (Signed)
Patient denies any pain or symptoms at this time. Patient is on monitor.

## 2018-09-30 NOTE — ED Triage Notes (Signed)
Pt presents to ED with complaints of hypertension. Pt states her bp at home was 185/100. Pt denies dizziness, blurred vision or weakness.

## 2018-09-30 NOTE — ED Provider Notes (Signed)
[facility] EMERGENCY DEPARTMENT Provider Note   CSN: [id number] Arrival date & time: 09/30/18  2147     History   Chief Complaint Chief Complaint  Patient presents with  . Hypertension    HPI [name] is a 73 y.o. female.  HPI Patient states that 2 weeks ago she became lightheaded and had syncopal episode while she was standing in the kitchen.  States she struck the left side of her scalp.  Since that time she is been having episodic tingling sensation to her scalp and feeling drainage.  She denies headache.  No visual changes.  Denies any nausea or vomiting.  No focal weakness or numbness.  States she took her blood pressure at home and her systolic was greater than 180.  She has not been on blood pressure medication for several years now. Past Medical History:  Diagnosis Date  . Anxiety   . Arthritis   . Glaucoma   . Hypertension     Patient Active Problem List   Diagnosis Date Noted  . Lumbar stenosis with neurogenic claudication 11/28/2015  . Arthritis of knee, degenerative 02/07/2014  . Knee contusion 06/20/2013  . Foot sprain 06/20/2013  . Neck sprain 06/20/2013  . DDD (degenerative disc disease), lumbosacral 01/11/2013  . Lumbar spondylosis with myelopathy 01/11/2013  . Sciatica 01/11/2013  . Status post right knee replacement 01/11/2013  . DJD (degenerative joint disease), ankle and foot 09/15/2011  . OA (osteoarthritis) of knee 09/15/2011  . S/P total knee replacement 09/15/2011  . ARTHRITIS, RIGHT FOOT 05/18/2010  . SHOULDER PAIN, LEFT 07/03/2008  . TOTAL KNEE FOLLOW-UP 02/06/2008  . DEGENERATIVE JOINT DISEASE, KNEE 07/26/2007    Past Surgical History:  Procedure Laterality Date  . APPENDECTOMY    . JOINT REPLACEMENT     both knees 2009 and shoulder reconstruction 2006  . KNEE ARTHROPLASTY Right   . KNEE ARTHROPLASTY Left   . LUMBAR LAMINECTOMY/DECOMPRESSION MICRODISCECTOMY N/A 11/28/2015   Procedure: LUMBAR LAMINECTOMY/ DECOMPRESSION  MICRODISCECTOMY LUMBAR TWO-THREE, LUMBAR THREE-FOUR  ;  Surgeon: Ashok Pall, MD;  Location: [facility];  Service: Neurosurgery;  Laterality: N/A;  . PARTIAL HYSTERECTOMY    . SHOULDER SURGERY     reconstruction  . TONSILECTOMY, ADENOIDECTOMY, BILATERAL MYRINGOTOMY AND TUBES       OB History   No obstetric history on file.      Home Medications    Prior to Admission medications   Medication Sig Start Date End Date Taking? Authorizing Provider  ASTAXANTHIN PO Take 1 tablet by mouth daily.   Yes [provider]  Biotin w/ Vitamins C & E (HAIR/SKIN/NAILS PO) Take 1 tablet by mouth daily.   Yes [provider]  buPROPion (WELLBUTRIN XL) 300 MG 24 hr tablet Take 300 mg by mouth daily.   Yes [provider]  celecoxib (CELEBREX) 100 MG capsule Take 1 capsule (100 mg total) by mouth 2 (two) times daily. 12/16/17  Yes Carole Civil, MD  Cholecalciferol (VITAMIN D-3 PO) Take 2,500 Units by mouth daily.    Yes [provider]  CINNAMON PO Take 350 mg by mouth daily.    Yes [provider]  Coenzyme Q10 (COQ10) 200 MG CAPS Take 200 mg by mouth daily.    Yes [provider]  cyanocobalamin 500 MCG tablet Take 500 mcg by mouth daily.   Yes [provider]  cyclobenzaprine (FLEXERIL) 10 MG tablet Take 1 tablet (10 mg total) by mouth 3 (three) times daily as needed  for muscle spasms. 10/13/15  Yes Carole Civil, MD  gabapentin (NEURONTIN) 100 MG capsule Take 1 capsule (100 mg total) by mouth 3 (three) times daily. 02/17/16  Yes Carole Civil, MD  HYDROcodone-acetaminophen (NORCO) 7.5-325 MG tablet Take 1 tablet by mouth every 4 (four) hours as needed for moderate pain. 02/15/17  Yes Carole Civil, MD  LORazepam (ATIVAN) 0.5 MG tablet Take 0.5 mg by mouth 2 (two) times daily as needed for anxiety.   Yes [provider]  Omega-3 Fatty Acids (OMEGA 3 PO) Take 2,000 mg by mouth daily.    Yes [provider]  OVER THE COUNTER MEDICATION Take 1 tablet by mouth daily.   Yes [provider]  OVER THE COUNTER MEDICATION Take 1 tablet by mouth daily.   Yes [provider]  Propylene Glycol (SYSTANE BALANCE) 0.6 % SOLN Place 1 drop into both eyes daily as needed (for dry eyes).   Yes [provider]  Resveratrol 100 MG CAPS Take 100 mg by mouth daily.   Yes [provider]  TRAVATAN Z 0.004 % SOLN ophthalmic solution Place 1 drop into both eyes at bedtime.  08/28/11  Yes [provider]  triamterene-hydrochlorothiazide (MAXZIDE-25) 37.5-25 MG per tablet Take 1 tablet by mouth daily as needed (for fluid).  07/28/11  Yes [provider]  TURMERIC PO Take 200 mg by mouth daily.   Yes [provider]  cephALEXin (KEFLEX) 500 MG capsule Take 1 capsule (500 mg total) by mouth 3 (three) times daily. 09/30/18   Julianne Rice, MD  diclofenac (VOLTAREN) 75 MG EC tablet Take 75 mg by mouth 2 (two) times daily.    [provider]  potassium chloride (K-DUR) 10 MEQ tablet Take 1 tablet (10 mEq total) by mouth daily. 10/01/18   Julianne Rice, MD  tiZANidine (ZANAFLEX) 4 MG capsule Take 4 mg by mouth 3 (three) times daily.    [provider]    Family History Family History  Problem Relation Age of Onset  . Colon cancer Cousin     Social History Social History   Tobacco Use  . Smoking status: Never Smoker  . Smokeless tobacco: Never Used  Substance Use Topics  . Alcohol use: No  . Drug use: No     Allergies   Sulfonamide derivatives   Review of Systems Review of Systems  Constitutional: Negative for chills, fatigue and fever.  HENT: Negative for sore throat and trouble swallowing.   Eyes: Negative for visual disturbance.  Respiratory: Negative for cough and shortness of breath.   Cardiovascular: Negative for chest pain.  Gastrointestinal: Negative for abdominal pain, constipation, diarrhea, nausea and vomiting.    Genitourinary: Negative for dysuria, flank pain and frequency.  Musculoskeletal: Negative for back pain, myalgias, neck pain and neck stiffness.  Skin: Negative for rash and wound.  Neurological: Positive for dizziness and light-headedness. Negative for weakness, numbness and headaches.  Psychiatric/Behavioral: The patient is nervous/anxious.   All other systems reviewed and are negative.    Physical Exam Updated Vital Signs BP (!) 161/78   Pulse 68   Temp 97.8 F (36.6 C) (Oral)   Resp 16   Ht 5\' 3"  (1.6 m)   Wt 77.1 kg   SpO2 97%   BMI 30.11 kg/m   Physical Exam Vitals signs and nursing note reviewed.  Constitutional:      General: She is not in acute distress.    Appearance: Normal appearance. She is well-developed. She  is not ill-appearing.  HENT:     Head: Normocephalic and atraumatic.     Comments: No obvious scalp injuries.    Nose: Nose normal.     Mouth/Throat:     Mouth: Mucous membranes are moist.  Eyes:     Extraocular Movements: Extraocular movements intact.     Pupils: Pupils are equal, round, and reactive to light.  Neck:     Musculoskeletal: Normal range of motion and neck supple. No neck rigidity or muscular tenderness.     Vascular: No carotid bruit.  Cardiovascular:     Rate and Rhythm: Normal rate and regular rhythm.     Heart sounds: No murmur. No gallop.   Pulmonary:     Effort: Pulmonary effort is normal. No respiratory distress.     Breath sounds: Normal breath sounds. No stridor. No wheezing, rhonchi or rales.  Chest:     Chest wall: No tenderness.  Abdominal:     General: Bowel sounds are normal.     Palpations: Abdomen is soft.     Tenderness: There is no abdominal tenderness. There is no guarding or rebound.  Musculoskeletal: Normal range of motion.        General: No swelling, tenderness, deformity or signs of injury.     Right lower leg: No edema.     Left lower leg: No edema.  Lymphadenopathy:     Cervical: No cervical  adenopathy.  Skin:    General: Skin is warm and dry.     Capillary Refill: Capillary refill takes less than 2 seconds.     Coloration: Skin is not jaundiced.     Findings: No bruising, erythema or rash.  Neurological:     General: No focal deficit present.     Mental Status: She is alert and oriented to person, place, and time.     Comments: Patient is alert and oriented x3 with clear, goal oriented speech. Patient has 5/5 motor in all extremities. Sensation is intact to light touch. Bilateral finger-to-nose is normal with no signs of dysmetria.   Psychiatric:        Behavior: Behavior normal.     Comments: Anxious appearing      ED Treatments / Results  Labs (all labs ordered are listed, but only abnormal results are displayed) Labs Reviewed  CBC WITH DIFFERENTIAL/PLATELET - Abnormal; Notable for the following components:      Result Value   RBC 5.17 (*)    Hemoglobin 15.8 (*)    HCT 47.2 (*)    All other components within normal limits  BASIC METABOLIC PANEL - Abnormal; Notable for the following components:   Potassium 3.1 (*)    Glucose, Bld 111 (*)    All other components within normal limits  URINALYSIS, ROUTINE W REFLEX MICROSCOPIC - Abnormal; Notable for the following components:   Leukocytes, UA MODERATE (*)    All other components within normal limits  URINALYSIS, MICROSCOPIC (REFLEX) - Abnormal; Notable for the following components:   Bacteria, UA RARE (*)    All other components within normal limits    EKG EKG Interpretation  Date/Time:  Saturday September 30 2018 22:04:57 EST Ventricular Rate:  70 PR Interval:    QRS Duration: 96 QT Interval:  390 QTC Calculation: 421 R Axis:   50 Text Interpretation:  Sinus rhythm Atrial premature complexes Baseline wander in lead(s) I III aVR aVL V1 Confirmed by Julianne Rice [phone]) on 09/30/2018 10:30:24 PM   Radiology Ct Head Wo Contrast  Result Date: 09/30/2018 CLINICAL DATA:  Fall 1 week ago.  Dizziness,  headache. EXAM: CT HEAD WITHOUT CONTRAST TECHNIQUE: Contiguous axial images were obtained from the base of the skull through the vertex without intravenous contrast. COMPARISON:  11/04/2011 FINDINGS: Brain: No acute intracranial abnormality. Specifically, no hemorrhage, hydrocephalus, mass lesion, acute infarction, or significant intracranial injury. Vascular: No hyperdense vessel or unexpected calcification. Skull: No acute calvarial abnormality. Sinuses/Orbits: Visualized paranasal sinuses and mastoids clear. Orbital soft tissues unremarkable. Other: None IMPRESSION: No acute intracranial abnormality. Electronically Signed   By: Rolm Baptise M.D.   On: 09/30/2018 23:27    Procedures Procedures (including critical care time)  Medications Ordered in ED Medications  potassium chloride SA (K-DUR,KLOR-CON) CR tablet 40 mEq (has no administration in time range)  cephALEXin (KEFLEX) capsule 500 mg (500 mg Oral Given 09/30/18 2359)     Initial Impression / Assessment and Plan / ED Course  I have reviewed the triage vital signs and the nursing notes.  Pertinent labs & imaging results that were available during my care of the patient were reviewed by me and considered in my medical decision making (see chart for details).    Mild hypokalemia.  CT without acute findings.  Patient appears to have urinary tract infection.  Will start on Keflex.  Initiate potassium replacement.  Advise follow-up closely with her primary physician regarding restarting her blood pressure medication.  Strict return precautions have been given.   Final Clinical Impressions(s) / ED Diagnoses   Final diagnoses:  Elevated blood pressure reading  Acute lower UTI  Hypokalemia    ED Discharge Orders         Ordered    potassium chloride (K-DUR) 10 MEQ tablet  Daily     10/01/18 0010    cephALEXin (KEFLEX) 500 MG capsule  3 times daily     09/30/18 2353           Julianne Rice, MD 10/01/18 [phone]

## 2018-10-01 DIAGNOSIS — I10 Essential (primary) hypertension: Secondary | ICD-10-CM | POA: Diagnosis not present

## 2018-10-01 LAB — BASIC METABOLIC PANEL
ANION GAP: 10 (ref 5–15)
BUN: 10 mg/dL (ref 8–23)
CO2: 24 mmol/L (ref 22–32)
Calcium: 9.7 mg/dL (ref 8.9–10.3)
Chloride: 104 mmol/L (ref 98–111)
Creatinine, Ser: 0.79 mg/dL (ref 0.44–1.00)
GFR calc non Af Amer: >60 mL/min (ref >60)
Glucose, Bld: 111 mg/dL — ABNORMAL HIGH (ref 70–99)
Potassium: 3.1 mmol/L — ABNORMAL LOW (ref 3.5–5.1)
Sodium: 138 mmol/L (ref 135–145)

## 2018-10-01 MED ORDER — POTASSIUM CHLORIDE ER 10 MEQ PO TBCR: 10.0000 meq | EXTENDED_RELEASE_TABLET | Freq: Every day | ORAL | 0 refills | Status: DC

## 2018-10-01 MED ORDER — POTASSIUM CHLORIDE CRYS ER 20 MEQ PO TBCR
40.0000 meq | EXTENDED_RELEASE_TABLET | Freq: Once | ORAL | Status: AC
  Administered 2018-10-01: 40 meq via ORAL
  Filled 2018-10-01: qty 2

## 2018-10-02 DIAGNOSIS — R55 Syncope and collapse: Secondary | ICD-10-CM | POA: Diagnosis not present

## 2018-10-02 DIAGNOSIS — Z6831 Body mass index (BMI) 31.0-31.9, adult: Secondary | ICD-10-CM | POA: Diagnosis not present

## 2018-10-02 DIAGNOSIS — E876 Hypokalemia: Secondary | ICD-10-CM | POA: Diagnosis not present

## 2018-10-02 DIAGNOSIS — Z2821 Immunization not carried out because of patient refusal: Secondary | ICD-10-CM | POA: Diagnosis not present

## 2018-10-02 DIAGNOSIS — I10 Essential (primary) hypertension: Secondary | ICD-10-CM | POA: Diagnosis not present

## 2018-10-02 DIAGNOSIS — Z789 Other specified health status: Secondary | ICD-10-CM | POA: Diagnosis not present

## 2018-10-02 DIAGNOSIS — E785 Hyperlipidemia, unspecified: Secondary | ICD-10-CM | POA: Diagnosis not present

## 2018-10-02 DIAGNOSIS — Z299 Encounter for prophylactic measures, unspecified: Secondary | ICD-10-CM | POA: Diagnosis not present

## 2018-10-09 DIAGNOSIS — Z6830 Body mass index (BMI) 30.0-30.9, adult: Secondary | ICD-10-CM | POA: Diagnosis not present

## 2018-10-09 DIAGNOSIS — I10 Essential (primary) hypertension: Secondary | ICD-10-CM | POA: Diagnosis not present

## 2018-10-09 DIAGNOSIS — Z299 Encounter for prophylactic measures, unspecified: Secondary | ICD-10-CM | POA: Diagnosis not present

## 2018-10-13 ENCOUNTER — Emergency Department (HOSPITAL_COMMUNITY): Payer: Medicare Other

## 2018-10-13 ENCOUNTER — Other Ambulatory Visit: Payer: Self-pay

## 2018-10-13 ENCOUNTER — Encounter (HOSPITAL_COMMUNITY): Payer: Self-pay

## 2018-10-13 ENCOUNTER — Emergency Department (HOSPITAL_COMMUNITY)
Admission: EM | Admit: 2018-10-13 | Discharge: 2018-10-13 | Disposition: A | Discharge status: Home / Self Care | Payer: Medicare Other | Attending: Emergency Medicine | Admitting: Emergency Medicine

## 2018-10-13 DIAGNOSIS — Z79899 Other long term (current) drug therapy: Secondary | ICD-10-CM | POA: Diagnosis not present

## 2018-10-13 DIAGNOSIS — Z96653 Presence of artificial knee joint, bilateral: Secondary | ICD-10-CM | POA: Diagnosis not present

## 2018-10-13 DIAGNOSIS — I10 Essential (primary) hypertension: Secondary | ICD-10-CM | POA: Insufficient documentation

## 2018-10-13 DIAGNOSIS — S199XXA Unspecified injury of neck, initial encounter: Secondary | ICD-10-CM | POA: Diagnosis not present

## 2018-10-13 DIAGNOSIS — S299XXA Unspecified injury of thorax, initial encounter: Secondary | ICD-10-CM | POA: Diagnosis not present

## 2018-10-13 DIAGNOSIS — S0990XA Unspecified injury of head, initial encounter: Secondary | ICD-10-CM | POA: Diagnosis not present

## 2018-10-13 DIAGNOSIS — R270 Ataxia, unspecified: Secondary | ICD-10-CM | POA: Diagnosis not present

## 2018-10-13 DIAGNOSIS — R55 Syncope and collapse: Secondary | ICD-10-CM

## 2018-10-13 LAB — BASIC METABOLIC PANEL
ANION GAP: 8 (ref 5–15)
BUN: 11 mg/dL (ref 8–23)
CO2: 26 mmol/L (ref 22–32)
Calcium: 9.7 mg/dL (ref 8.9–10.3)
Chloride: 106 mmol/L (ref 98–111)
Creatinine, Ser: 0.8 mg/dL (ref 0.44–1.00)
GFR calc non Af Amer: >60 mL/min (ref >60)
GLUCOSE: 133 mg/dL — AB (ref 70–99)
Potassium: 3.4 mmol/L — ABNORMAL LOW (ref 3.5–5.1)
Sodium: 140 mmol/L (ref 135–145)

## 2018-10-13 LAB — HEPATIC FUNCTION PANEL
ALK PHOS: 68 U/L (ref 38–126)
ALT: 19 U/L (ref 0–44)
AST: 24 U/L (ref 15–41)
Albumin: 4.4 g/dL (ref 3.5–5.0)
BILIRUBIN TOTAL: 1 mg/dL (ref 0.3–1.2)
Bilirubin, Direct: 0.1 mg/dL (ref 0.0–0.2)
Indirect Bilirubin: 0.9 mg/dL (ref 0.3–0.9)
Total Protein: 7.2 g/dL (ref 6.5–8.1)

## 2018-10-13 LAB — URINALYSIS, ROUTINE W REFLEX MICROSCOPIC
Bacteria, UA: NONE SEEN
Bilirubin Urine: NEGATIVE
GLUCOSE, UA: NEGATIVE mg/dL
HGB URINE DIPSTICK: NEGATIVE
Ketones, ur: NEGATIVE mg/dL
Nitrite: NEGATIVE
Protein, ur: NEGATIVE mg/dL
Specific Gravity, Urine: 1.005 (ref 1.005–1.030)
pH: 7 (ref 5.0–8.0)

## 2018-10-13 LAB — CBC
HCT: 46.1 % — ABNORMAL HIGH (ref 36.0–46.0)
Hemoglobin: 15.6 g/dL — ABNORMAL HIGH (ref 12.0–15.0)
MCH: 31.1 pg (ref 26.0–34.0)
MCHC: 33.8 g/dL (ref 30.0–36.0)
MCV: 91.8 fL (ref 80.0–100.0)
PLATELETS: 230 10*3/uL (ref 150–400)
RBC: 5.02 MIL/uL (ref 3.87–5.11)
RDW: 12.3 % (ref 11.5–15.5)
WBC: 9.4 10*3/uL (ref 4.0–10.5)
nRBC: 0 % (ref 0.0–0.2)

## 2018-10-13 LAB — TROPONIN I: Troponin I: <0.03 ng/mL (ref <0.03)

## 2018-10-13 LAB — CBG MONITORING, ED: Glucose-Capillary: 138 mg/dL — ABNORMAL HIGH (ref 70–99)

## 2018-10-13 MED ORDER — HYDROCODONE-ACETAMINOPHEN 5-325 MG PO TABS: 1.0000 | ORAL_TABLET | Freq: Four times a day (QID) | ORAL | 0 refills | Status: DC | PRN

## 2018-10-13 MED ORDER — HYDROCODONE-ACETAMINOPHEN 5-325 MG PO TABS
1.0000 | ORAL_TABLET | Freq: Once | ORAL | Status: AC
  Administered 2018-10-13: 1 via ORAL
  Filled 2018-10-13: qty 1

## 2018-10-13 MED ORDER — SODIUM CHLORIDE 0.9% FLUSH
3.0000 mL | Freq: Once | INTRAVENOUS | Status: AC
  Administered 2018-10-13: 3 mL via INTRAVENOUS

## 2018-10-13 NOTE — ED Notes (Signed)
Patient transported to CT 

## 2018-10-13 NOTE — Discharge Instructions (Addendum)
Follow-up with your primary care doctor next week after you have your echo done.  Return sooner if any problems.  Show your doctor the tests we have done here.   Also your urine culture should be done by time he follow-up next week

## 2018-10-13 NOTE — ED Notes (Signed)
Patient ambulatory to restroom without assistance.  Patient denies any dizziness.

## 2018-10-13 NOTE — ED Triage Notes (Addendum)
Pt reports that she woke up at 2 am, she had a bloody tissue in hands from where her nose had been bleeding. Pt states she must have went to [facility] and fell due to blood in [facility] floor. Pt does not remember the incident. Reports her teeth and left shoulder hurt. Pt has echo planned for Monday  And reports she was started on BP med with last visit

## 2018-10-13 NOTE — ED Notes (Signed)
Patient given ice pack for left shoulder pain.

## 2018-10-13 NOTE — ED Provider Notes (Signed)
[facility] EMERGENCY DEPARTMENT Provider Note   CSN: [id number] Arrival date & time: 10/13/18  0086    History   Chief Complaint Chief Complaint  Patient presents with  . Loss of Consciousness    HPI GINNI EICHLER is a 73 y.o. female.     Patient states that she awoke in her bed holding a bloody tissue.  She was also complaining of pain in her left shoulder.  She then went into the bathroom stall that looked like she had fallen because there is blood from her nose and her mouth in the bathroom.  The history is provided by the patient. No language interpreter was used.  Loss of Consciousness  Episode history:  Single Most recent episode:  Today Duration: Unknown. Timing:  Rare Progression:  Resolved Chronicity:  New Context: not blood draw   Witnessed: no   Worsened by:  Nothing Ineffective treatments:  None tried Associated symptoms: no anxiety, no chest pain, no headaches and no seizures     Past Medical History:  Diagnosis Date  . Anxiety   . Arthritis   . Glaucoma   . Hypertension     Patient Active Problem List   Diagnosis Date Noted  . Lumbar stenosis with neurogenic claudication 11/28/2015  . Arthritis of knee, degenerative 02/07/2014  . Knee contusion 06/20/2013  . Foot sprain 06/20/2013  . Neck sprain 06/20/2013  . DDD (degenerative disc disease), lumbosacral 01/11/2013  . Lumbar spondylosis with myelopathy 01/11/2013  . Sciatica 01/11/2013  . Status post right knee replacement 01/11/2013  . DJD (degenerative joint disease), ankle and foot 09/15/2011  . OA (osteoarthritis) of knee 09/15/2011  . S/P total knee replacement 09/15/2011  . ARTHRITIS, RIGHT FOOT 05/18/2010  . SHOULDER PAIN, LEFT 07/03/2008  . TOTAL KNEE FOLLOW-UP 02/06/2008  . DEGENERATIVE JOINT DISEASE, KNEE 07/26/2007    Past Surgical History:  Procedure Laterality Date  . APPENDECTOMY    . JOINT REPLACEMENT     both knees 2009 and shoulder reconstruction 2006  . KNEE  ARTHROPLASTY Right   . KNEE ARTHROPLASTY Left   . LUMBAR LAMINECTOMY/DECOMPRESSION MICRODISCECTOMY N/A 11/28/2015   Procedure: LUMBAR LAMINECTOMY/ DECOMPRESSION MICRODISCECTOMY LUMBAR TWO-THREE, LUMBAR THREE-FOUR  ;  Surgeon: Ashok Pall, MD;  Location: [place];  Service: Neurosurgery;  Laterality: N/A;  . PARTIAL HYSTERECTOMY    . SHOULDER SURGERY     reconstruction  . TONSILECTOMY, ADENOIDECTOMY, BILATERAL MYRINGOTOMY AND TUBES       OB History   No obstetric history on file.      Home Medications    Prior to Admission medications   Medication Sig Start Date End Date Taking? Authorizing Provider  ASTAXANTHIN PO Take 1 tablet by mouth daily.    [provider]  Biotin w/ Vitamins C & E (HAIR/SKIN/NAILS PO) Take 1 tablet by mouth daily.    [provider]  buPROPion (WELLBUTRIN XL) 300 MG 24 hr tablet Take 300 mg by mouth daily.    [provider]  celecoxib (CELEBREX) 100 MG capsule Take 1 capsule (100 mg total) by mouth 2 (two) times daily. 12/16/17   Carole Civil, MD  cephALEXin (KEFLEX) 500 MG capsule Take 1 capsule (500 mg total) by mouth 3 (three) times daily. 09/30/18   Julianne Rice, MD  Cholecalciferol (VITAMIN D-3 PO) Take 2,500 Units by mouth daily.     [provider]  CINNAMON PO Take 350 mg by mouth daily.     [provider]  Coenzyme Q10 (  COQ10) 200 MG CAPS Take 200 mg by mouth daily.     [provider]  cyanocobalamin 500 MCG tablet Take 500 mcg by mouth daily.    [provider]  cyclobenzaprine (FLEXERIL) 10 MG tablet Take 1 tablet (10 mg total) by mouth 3 (three) times daily as needed for muscle spasms. 10/13/15   Carole Civil, MD  diclofenac (VOLTAREN) 75 MG EC tablet Take 75 mg by mouth 2 (two) times daily.    [provider]  gabapentin (NEURONTIN) 100 MG capsule Take 1 capsule (100 mg total) by mouth 3 (three) times daily. 02/17/16   Carole Civil, MD    HYDROcodone-acetaminophen (NORCO/VICODIN) 5-325 MG tablet Take 1 tablet by mouth every 6 (six) hours as needed for moderate pain. 10/13/18   Milton Ferguson, MD  LORazepam (ATIVAN) 0.5 MG tablet Take 0.5 mg by mouth 2 (two) times daily as needed for anxiety.    [provider]  Omega-3 Fatty Acids (OMEGA 3 PO) Take 2,000 mg by mouth daily.     [provider]  OVER THE COUNTER MEDICATION Take 1 tablet by mouth daily.    [provider]  OVER THE COUNTER MEDICATION Take 1 tablet by mouth daily.    [provider]  potassium chloride (K-DUR) 10 MEQ tablet Take 1 tablet (10 mEq total) by mouth daily. 10/01/18   Julianne Rice, MD  Propylene Glycol (SYSTANE BALANCE) 0.6 % SOLN Place 1 drop into both eyes daily as needed (for dry eyes).    [provider]  Resveratrol 100 MG CAPS Take 100 mg by mouth daily.    [provider]  tiZANidine (ZANAFLEX) 4 MG capsule Take 4 mg by mouth 3 (three) times daily.    [provider]  TRAVATAN Z 0.004 % SOLN ophthalmic solution Place 1 drop into both eyes at bedtime.  08/28/11   [provider]  triamterene-hydrochlorothiazide (MAXZIDE-25) 37.5-25 MG per tablet Take 1 tablet by mouth daily as needed (for fluid).  07/28/11   [provider]  TURMERIC PO Take 200 mg by mouth daily.    [provider]    Family History Family History  Problem Relation Age of Onset  . Colon cancer Cousin     Social History Social History   Tobacco Use  . Smoking status: Never Smoker  . Smokeless tobacco: Never Used  Substance Use Topics  . Alcohol use: No  . Drug use: No     Allergies   Sulfonamide derivatives   Review of Systems Review of Systems  Constitutional: Negative for appetite change and fatigue.  HENT: Negative for congestion, ear discharge and sinus pressure.   Eyes: Negative for discharge.  Respiratory: Negative for cough.   Cardiovascular: Positive for syncope.  Negative for chest pain.  Gastrointestinal: Negative for abdominal pain and diarrhea.  Genitourinary: Negative for frequency and hematuria.  Musculoskeletal: Negative for back pain.  Skin: Negative for rash.  Neurological: Positive for syncope. Negative for seizures and headaches.  Psychiatric/Behavioral: Negative for hallucinations.     Physical Exam Updated Vital Signs BP 130/71   Pulse 69   Temp (!) 97.5 F (36.4 C) (Oral)   Resp 17   Ht 5\' 3"  (1.6 m)   Wt 34.9 kg   SpO2 100%   BMI 13.64 kg/m   Physical Exam Nursing note reviewed.  Constitutional:      Appearance: She is well-developed.  HENT:     Head: Normocephalic.     Comments:  Bruising to upper lip and gum    Nose: Nose normal.  Eyes:     General: No scleral icterus.    Conjunctiva/sclera: Conjunctivae normal.  Neck:     Musculoskeletal: Neck supple.     Thyroid: No thyromegaly.  Cardiovascular:     Rate and Rhythm: Normal rate and regular rhythm.     Heart sounds: No murmur. No friction rub. No gallop.   Pulmonary:     Breath sounds: No stridor. No wheezing or rales.  Chest:     Chest wall: No tenderness.  Abdominal:     General: There is no distension.     Tenderness: There is no abdominal tenderness. There is no rebound.  Musculoskeletal: Normal range of motion.     Comments: Tenderness to left shoulder  Lymphadenopathy:     Cervical: No cervical adenopathy.  Skin:    Findings: No erythema or rash.  Neurological:     Mental Status: She is oriented to person, place, and time.     Motor: No abnormal muscle tone.     Coordination: Coordination normal.  Psychiatric:        Behavior: Behavior normal.      ED Treatments / Results  Labs (all labs ordered are listed, but only abnormal results are displayed) Labs Reviewed  BASIC METABOLIC PANEL - Abnormal; Notable for the following components:      Result Value   Potassium 3.4 (*)    Glucose, Bld 133 (*)    All other components within normal  limits  CBC - Abnormal; Notable for the following components:   Hemoglobin 15.6 (*)    HCT 46.1 (*)    All other components within normal limits  URINALYSIS, ROUTINE W REFLEX MICROSCOPIC - Abnormal; Notable for the following components:   Leukocytes,Ua SMALL (*)    All other components within normal limits  CBG MONITORING, ED - Abnormal; Notable for the following components:   Glucose-Capillary 138 (*)    All other components within normal limits  URINE CULTURE  TROPONIN I  HEPATIC FUNCTION PANEL    EKG None  Radiology Dg Chest 2 View  Result Date: 10/13/2018 CLINICAL DATA:  Fall this morning EXAM: CHEST - 2 VIEW COMPARISON:  06/01/2017 FINDINGS: Normal heart size and mediastinal contours. No acute infiltrate or edema. No effusion or pneumothorax. Spondylosis. No acute osseous findings. Right rotator cuff repair. Artifact from EKG leads. IMPRESSION: No active cardiopulmonary disease. Electronically Signed   By: Monte Fantasia M.D.   On: 10/13/2018 10:39   Ct Head Wo Contrast  Result Date: 10/13/2018 CLINICAL DATA:  Ataxia, head trauma, fall EXAM: CT HEAD WITHOUT CONTRAST CT CERVICAL SPINE WITHOUT CONTRAST TECHNIQUE: Multidetector CT imaging of the head and cervical spine was performed following the standard protocol without intravenous contrast. Multiplanar CT image reconstructions of the cervical spine were also generated. COMPARISON:  09/30/2018 FINDINGS: CT HEAD FINDINGS Brain: No evidence of acute infarction, hemorrhage, hydrocephalus, extra-axial collection or mass lesion/mass effect. Vascular: No hyperdense vessel or unexpected calcification. Skull: Normal. Negative for fracture or focal lesion. Sinuses/Orbits: No acute finding. Other: None. CT CERVICAL SPINE FINDINGS Alignment: Normal. Skull base and vertebrae: No acute fracture. No primary bone lesion or focal pathologic process. Soft tissues and spinal canal: No prevertebral fluid or swelling. No visible canal hematoma. Disc  levels: Moderate multilevel disc space height loss and osteophytosis. Upper chest: Negative. Other: None. IMPRESSION: 1.  No acute intracranial pathology. 2.  No fracture or static subluxation of the cervical spine.  Electronically Signed   By: Eddie Candle M.D.   On: 10/13/2018 10:49   Ct Cervical Spine Wo Contrast  Result Date: 10/13/2018 CLINICAL DATA:  Ataxia, head trauma, fall EXAM: CT HEAD WITHOUT CONTRAST CT CERVICAL SPINE WITHOUT CONTRAST TECHNIQUE: Multidetector CT imaging of the head and cervical spine was performed following the standard protocol without intravenous contrast. Multiplanar CT image reconstructions of the cervical spine were also generated. COMPARISON:  09/30/2018 FINDINGS: CT HEAD FINDINGS Brain: No evidence of acute infarction, hemorrhage, hydrocephalus, extra-axial collection or mass lesion/mass effect. Vascular: No hyperdense vessel or unexpected calcification. Skull: Normal. Negative for fracture or focal lesion. Sinuses/Orbits: No acute finding. Other: None. CT CERVICAL SPINE FINDINGS Alignment: Normal. Skull base and vertebrae: No acute fracture. No primary bone lesion or focal pathologic process. Soft tissues and spinal canal: No prevertebral fluid or swelling. No visible canal hematoma. Disc levels: Moderate multilevel disc space height loss and osteophytosis. Upper chest: Negative. Other: None. IMPRESSION: 1.  No acute intracranial pathology. 2.  No fracture or static subluxation of the cervical spine. Electronically Signed   By: Eddie Candle M.D.   On: 10/13/2018 10:49    Procedures Procedures (including critical care time)  Medications Ordered in ED Medications  sodium chloride flush (NS) 0.9 % injection 3 mL (3 mLs Intravenous Given 10/13/18 0924)  HYDROcodone-acetaminophen (NORCO/VICODIN) 5-325 MG per tablet 1 tablet (1 tablet Oral Given 10/13/18 1111)     Initial Impression / Assessment and Plan / ED Course  I have reviewed the triage vital signs and the nursing  notes.  Pertinent labs & imaging results that were available during my care of the patient were reviewed by me and considered in my medical decision making (see chart for details).       Labs unremarkable except for some white cells in her urine.  We will culture that.  CT the head neck along with chest x-ray showed no significant injury.  Patient with syncopal episode melanite.  She will follow-up with her PCP.  She already has scheduled an echo for Monday  Final Clinical Impressions(s) / ED Diagnoses   Final diagnoses:  Syncope and collapse    ED Discharge Orders         Ordered    HYDROcodone-acetaminophen (NORCO/VICODIN) 5-325 MG tablet  Every 6 hours PRN     10/13/18 1204           Milton Ferguson, MD 10/13/18 1210

## 2018-10-13 NOTE — ED Notes (Signed)
Patient transported to X-ray 

## 2018-10-14 LAB — URINE CULTURE: Culture: NO GROWTH

## 2018-10-16 DIAGNOSIS — Z713 Dietary counseling and surveillance: Secondary | ICD-10-CM | POA: Diagnosis not present

## 2018-10-16 DIAGNOSIS — R55 Syncope and collapse: Secondary | ICD-10-CM | POA: Diagnosis not present

## 2018-10-16 DIAGNOSIS — Z299 Encounter for prophylactic measures, unspecified: Secondary | ICD-10-CM | POA: Diagnosis not present

## 2018-10-16 DIAGNOSIS — Z6830 Body mass index (BMI) 30.0-30.9, adult: Secondary | ICD-10-CM | POA: Diagnosis not present

## 2018-10-16 DIAGNOSIS — I10 Essential (primary) hypertension: Secondary | ICD-10-CM | POA: Diagnosis not present

## 2018-10-25 ENCOUNTER — Encounter: Payer: Self-pay | Admitting: Cardiology

## 2018-10-25 ENCOUNTER — Ambulatory Visit: Payer: Medicare Other

## 2018-10-25 ENCOUNTER — Ambulatory Visit (INDEPENDENT_AMBULATORY_CARE_PROVIDER_SITE_OTHER): Payer: Medicare Other | Admitting: Cardiology

## 2018-10-25 VITALS — BP 178/98 | HR 93 | Ht 63.0 in | Wt 176.0 lb

## 2018-10-25 DIAGNOSIS — M5432 Sciatica, left side: Secondary | ICD-10-CM

## 2018-10-25 DIAGNOSIS — I10 Essential (primary) hypertension: Secondary | ICD-10-CM

## 2018-10-25 DIAGNOSIS — R55 Syncope and collapse: Secondary | ICD-10-CM

## 2018-10-25 DIAGNOSIS — M5431 Sciatica, right side: Secondary | ICD-10-CM | POA: Diagnosis not present

## 2018-10-25 DIAGNOSIS — Z0189 Encounter for other specified special examinations: Secondary | ICD-10-CM | POA: Diagnosis not present

## 2018-10-25 DIAGNOSIS — I493 Ventricular premature depolarization: Secondary | ICD-10-CM | POA: Diagnosis not present

## 2018-10-25 HISTORY — DX: Essential (primary) hypertension: I10

## 2018-10-25 HISTORY — DX: Syncope and collapse: R55

## 2018-10-25 MED ORDER — VALSARTAN 160 MG PO TABS: 160.0000 mg | ORAL_TABLET | Freq: Every day | ORAL | 2 refills | Status: DC

## 2018-10-25 NOTE — Progress Notes (Signed)
Subjective:  Primary Physician:  Monico Blitz, MD  Patient ID: [name], female    DOB: 1945-09-27, 73 y.o.   MRN: [id number]  Chief Complaint  Patient presents with  . Hypertension    NP Eval  . Loss of Consciousness    HPI: [name]  is a 73 y.o. female  with History of chronic back pain and lumbar laminectomy in 2017, lumbar ago, degenerative joint disease, otherwise no significant cardiac history including hypertension, hyperlipidemia, diabetes mellitus who was referred to me for evaluation of syncope that occurred on 10/13/2008, woke up from bed with tissue paper soaked in blood and then walked to the toilet to see blood stains and  realized she may have fallen and she was bleeding from her nose and also from her mouth.  No complete recollection of the event.  CT scan of the head was nondiagnostic and labs including CBC and BMP there within normal limits and hence discharged home with recommendation of following up with PCP.  She is now referred to [place] for further evaluation.    She was also evaluated on 09/30/2018 when she presented to the emergency room with lightheadedness and syncope while standing in the kitchen and was noted to have occasional PACs, again CT scan of the head did not reveal any acute abnormality and is thought UTI and discharged home with Keflex.    Since then she has had recurrent episodes of feeling  Of tingling in her head and neck and also upper shoulders followed by slight nausea and feels like she may pass out.  She has had at least about 3-4 episodes since the event.  3 events occurred while she was up and about and one event occurred while she was still laying in bed.  Past Medical History:  Diagnosis Date  . Anxiety   . Arthritis   . Depression   . Glaucoma   . Gout   . Hyperlipidemia   . Hypertension   . Syncope and collapse 10/25/2018    Past Surgical History:  Procedure Laterality Date  . APPENDECTOMY    . JOINT  REPLACEMENT     both knees 2009 and shoulder reconstruction 2006  . KNEE ARTHROPLASTY Right   . KNEE ARTHROPLASTY Left   . LUMBAR LAMINECTOMY/DECOMPRESSION MICRODISCECTOMY N/A 11/28/2015   Procedure: LUMBAR LAMINECTOMY/ DECOMPRESSION MICRODISCECTOMY LUMBAR TWO-THREE, LUMBAR THREE-FOUR  ;  Surgeon: Ashok Pall, MD;  Location: [place] [facility];  Service: Neurosurgery;  Laterality: N/A;  . PARTIAL HYSTERECTOMY    . SHOULDER SURGERY     reconstruction  . TONSILECTOMY, ADENOIDECTOMY, BILATERAL MYRINGOTOMY AND TUBES      Social History   Socioeconomic History  . Marital status: Widowed    Spouse name: Not on file  . Number of children: Not on file  . Years of education: Not on file  . Highest education level: Not on file  Occupational History  . Not on file  Social Needs  . Financial resource strain: Not on file  . Food insecurity:    Worry: Not on file    Inability: Not on file  . Transportation needs:    Medical: Not on file    Non-medical: Not on file  Tobacco Use  . Smoking status: Never Smoker  . Smokeless tobacco: Never Used  Substance and Sexual Activity  . Alcohol use: No  . Drug use: No  . Sexual activity: Not on file  Lifestyle  . Physical activity:    Days  per week: Not on file    Minutes per session: Not on file  . Stress: Not on file  Relationships  . Social connections:    Talks on phone: Not on file    Gets together: Not on file    Attends religious service: Not on file    Active member of club or organization: Not on file    Attends meetings of clubs or organizations: Not on file    Relationship status: Not on file  . Intimate partner violence:    Fear of current or ex partner: Not on file    Emotionally abused: Not on file    Physically abused: Not on file    Forced sexual activity: Not on file  Other Topics Concern  . Not on file  Social History Narrative  . Not on file    Current Outpatient Medications on File Prior to Visit  Medication Sig  Dispense Refill  . ASTAXANTHIN PO Take 1 tablet by mouth daily.    . Biotin w/ Vitamins C & E (HAIR/SKIN/NAILS PO) Take 1 tablet by mouth daily.    Marland Kitchen buPROPion (WELLBUTRIN XL) 300 MG 24 hr tablet Take 300 mg by mouth daily.    . celecoxib (CELEBREX) 100 MG capsule Take 1 capsule (100 mg total) by mouth 2 (two) times daily. 180 capsule 3  . CINNAMON PO Take 350 mg by mouth daily.     . Coenzyme Q10 (COQ10) 200 MG CAPS Take 200 mg by mouth daily.     . cyanocobalamin 500 MCG tablet Take 500 mcg by mouth daily.    . cyclobenzaprine (FLEXERIL) 10 MG tablet Take 1 tablet (10 mg total) by mouth 3 (three) times daily as needed for muscle spasms. 90 tablet 0  . gabapentin (NEURONTIN) 100 MG capsule Take 1 capsule (100 mg total) by mouth 3 (three) times daily. 90 capsule 0  . HYDROcodone-acetaminophen (NORCO/VICODIN) 5-325 MG tablet Take 1 tablet by mouth every 6 (six) hours as needed for moderate pain. 20 tablet 0  . LORazepam (ATIVAN) 0.5 MG tablet Take 0.5 mg by mouth 2 (two) times daily as needed for anxiety.    . Omega-3 Fatty Acids (OMEGA 3 PO) Take 2,000 mg by mouth daily.     Marland Kitchen OVER THE COUNTER MEDICATION Take 1 tablet by mouth daily. Circulation and Vein,- grapeseed, pine bark, horse chestnut, and butchers broom    . Propylene Glycol (SYSTANE BALANCE) 0.6 % SOLN Place 1 drop into both eyes daily as needed (for dry eyes).    Marland Kitchen Resveratrol 100 MG CAPS Take 100 mg by mouth daily.    . TRAVATAN Z 0.004 % SOLN ophthalmic solution Place 1 drop into both eyes at bedtime.     . triamterene-hydrochlorothiazide (MAXZIDE-25) 37.5-25 MG per tablet Take 1 tablet by mouth daily as needed (for fluid).     . TURMERIC PO Take 200 mg by mouth daily.    . Cholecalciferol (VITAMIN D-3 PO) Take 2,500 Units by mouth daily.     . diclofenac (VOLTAREN) 75 MG EC tablet Take 75 mg by mouth 2 (two) times daily.     Current Facility-Administered Medications on File Prior to Visit  Medication Dose Route Frequency Provider  Last Rate Last Dose  . 0.9 %  sodium chloride infusion  500 mL Intravenous Continuous Nandigam, Venia Minks, MD         Review of Systems  Constitutional: Negative for malaise/fatigue and weight loss.  Respiratory: Negative for cough, hemoptysis and  shortness of breath.   Cardiovascular: Negative for chest pain, palpitations, claudication and leg swelling.  Gastrointestinal: Negative for abdominal pain, blood in stool, constipation, heartburn and vomiting.  Genitourinary: Negative for dysuria.  Musculoskeletal: Positive for joint pain (left shoulder since fall). Negative for myalgias.  Neurological: Positive for dizziness. Negative for focal weakness and headaches.  Endo/Heme/Allergies: Does not bruise/bleed easily.  Psychiatric/Behavioral: Negative for depression. The patient is not nervous/anxious.   All other systems reviewed and are negative.      Objective:  Blood pressure (!) 178/98, pulse 93, height 5\' 3"  (1.6 m), weight 176 lb (79.8 kg), SpO2 97 %. Body mass index is 31.18 kg/m.   Older Vitals  10/25/18 1:45 PM  10/25/18 1:48 PM  10/25/18 1:50 PM    BP  179/91  192/90  178/98   BP Location  LeftArm  LeftArm  LeftArm   Patient Position  Supine  Sitting  Standing   Cuff Size  Large  Large  Large   Pulse  82  82  93   SpO2  97%  98%  97%   Physical Exam  Constitutional: She appears well-developed. No distress.  Mildly obese  HENT:  Head: Atraumatic.  Eyes: Conjunctivae are normal.  Neck: Neck supple. No JVD present. No thyromegaly present.  Cardiovascular: Normal rate, regular rhythm, normal heart sounds and intact distal pulses. Frequent extrasystoles are present. Exam reveals no gallop.  No murmur heard. Pulmonary/Chest: Effort normal and breath sounds normal.  Abdominal: Soft. Bowel sounds are normal.  Musculoskeletal: Normal range of motion.        General: No edema.  Neurological: She is alert.  Skin: Skin is warm and dry.  Psychiatric: She has a  normal mood and affect.    CARDIAC STUDIES:   Echocardiogram at PCP   10/16/2018: LV size is normal, mild LVH, EF 21-19%, grade 1 diastolic dysfunction with elevated LVEDP.  Mildly calcified aortic valve with mild aortic regurgitation and mild aortic valve stenosis.  Mild tricuspid regurgitation, no pulmonary hypertension.  Assessment & Recommendations:   1. Syncope and collapse EKG  10/25/2018: Sinus  Rhythm  At rate of 88 bpm,- occasional ectopic ventricular beat   Poor R-wave progression, cannot exclude -Old anterior infarct. I am very doubtful this is neurogenic syncope.  2. Essential Hypertension,   3. Essential hypertension  4. PVC (premature ventricular contraction)  5. Laboratory examination BMP Latest Ref Rng & Units 10/13/2018 09/30/2018 11/20/2015  Glucose 70 - 99 mg/dL 133(H) 111(H) 89  BUN 8 - 23 mg/dL 11 10 12   Creatinine 0.44 - 1.00 mg/dL 0.80 0.79 0.77  Sodium 135 - 145 mmol/L 140 138 141  Potassium 3.5 - 5.1 mmol/L 3.4(L) 3.1(L) 3.8  Chloride 98 - 111 mmol/L 106 104 106  CO2 22 - 32 mmol/L 26 24 27   Calcium 8.9 - 10.3 mg/dL 9.7 9.7 10.1   CBC    Component Value Date/Time   WBC 9.4 10/13/2018 0922   RBC 5.02 10/13/2018 0922   HGB 15.6 (H) 10/13/2018 0922   HCT 46.1 (H) 10/13/2018 0922   PLT 230 10/13/2018 0922   MCV 91.8 10/13/2018 0922   MCH 31.1 10/13/2018 0922   MCHC 33.8 10/13/2018 0922   RDW 12.3 10/13/2018 0922   LYMPHSABS 2.3 09/30/2018 2340   MONOABS 0.6 09/30/2018 2340   EOSABS 0.2 09/30/2018 2340   BASOSABS 0.0 09/30/2018 2340     Recommendation:  Patient is an in for syncope, extremely difficult to say the etiology for  syncope, she does have frequent PVCs on auscultation and also PVC noted on the EKG.  Arrhythmic etiology needs to be excluded.  She'll wear an event monitor for 30 days.  Schedule for a Lexiscan Sestamibi stress test to evaluate for myocardial ischemia. Patient unable to do treadmill stress testing due to DJD and arthritis in  knee. I reviewed the echocardiogram that was performed in the PCP office.  I do not have her lipids.  Her blood pressure is still elevated, she has known prior essential hypertension but with weight loss had discontinued her medications.  She is presently on low dose of valsartan which have increased it to 160 mg daily, we'll obtain a BMP in about 2 weeks.  I'd like to see her back in 3 weeks to 4 weeks for follow-up.  Adrian Prows, MD, [facility] 10/25/2018, 2:10 PM [facility] [place] Pager: [phone] Office: [phone] If no answer Cell [phone]

## 2018-11-08 ENCOUNTER — Other Ambulatory Visit: Payer: Self-pay

## 2018-11-08 ENCOUNTER — Other Ambulatory Visit: Payer: Self-pay | Admitting: Cardiology

## 2018-11-08 ENCOUNTER — Ambulatory Visit: Payer: Medicare Other

## 2018-11-08 DIAGNOSIS — R55 Syncope and collapse: Secondary | ICD-10-CM | POA: Diagnosis not present

## 2018-11-08 DIAGNOSIS — I493 Ventricular premature depolarization: Secondary | ICD-10-CM

## 2018-11-09 LAB — BASIC METABOLIC PANEL
BUN/Creatinine Ratio: 17 (ref 12–28)
BUN: 14 mg/dL (ref 8–27)
CO2: 24 mmol/L (ref 20–29)
Calcium: 10.6 mg/dL — ABNORMAL HIGH (ref 8.7–10.3)
Chloride: 100 mmol/L (ref 96–106)
Creatinine, Ser: 0.84 mg/dL (ref 0.57–1.00)
GFR calc Af Amer: 81 mL/min/{1.73_m2} (ref >59)
GFR, EST NON AFRICAN AMERICAN: 71 mL/min/{1.73_m2} (ref >59)
Glucose: 106 mg/dL — ABNORMAL HIGH (ref 65–99)
Potassium: 4.5 mmol/L (ref 3.5–5.2)
Sodium: 142 mmol/L (ref 134–144)

## 2018-11-23 DIAGNOSIS — R55 Syncope and collapse: Secondary | ICD-10-CM | POA: Diagnosis not present

## 2018-12-03 ENCOUNTER — Telehealth: Payer: Self-pay | Admitting: Cardiology

## 2018-12-03 NOTE — Telephone Encounter (Signed)
Patients event monitor reviewed. Occasional PAC

## 2018-12-04 ENCOUNTER — Encounter: Payer: Self-pay | Admitting: Cardiology

## 2018-12-04 ENCOUNTER — Other Ambulatory Visit: Payer: Self-pay

## 2018-12-04 ENCOUNTER — Ambulatory Visit (INDEPENDENT_AMBULATORY_CARE_PROVIDER_SITE_OTHER): Payer: Medicare Other | Admitting: Cardiology

## 2018-12-04 VITALS — BP 150/78 | HR 62 | Ht 63.0 in | Wt 175.0 lb

## 2018-12-04 DIAGNOSIS — R55 Syncope and collapse: Secondary | ICD-10-CM

## 2018-12-04 DIAGNOSIS — I10 Essential (primary) hypertension: Secondary | ICD-10-CM | POA: Diagnosis not present

## 2018-12-04 DIAGNOSIS — I493 Ventricular premature depolarization: Secondary | ICD-10-CM

## 2018-12-04 MED ORDER — VALSARTAN 320 MG PO TABS: 320.0000 mg | ORAL_TABLET | Freq: Every day | ORAL | 3 refills | Status: DC

## 2018-12-04 NOTE — Progress Notes (Signed)
Virtual Visit via Telephone Note: This visit type was conducted due to national recommendations for restrictions regarding the COVID-19 Pandemic (e.g. social distancing).  This format is felt to be most appropriate for this patient at this time.  All issues noted in this document were discussed and addressed.  No physical exam was performed (except for noted visual exam findings with Telehealth visits).  The patient has consented to conduct a Telehealth visit and understands insurance will be billed.   I connected with@, on 12/04/18 at  by a telemedicine application and verified that I am speaking with the correct person using two identifiers.   I discussed the limitations of evaluation and management by telemedicine and the availability of in person appointments. The patient expressed understanding and agreed to proceed.   I have discussed with patient regarding the safety during COVID Pandemic and steps and precautions to be taken including social distancing, frequent hand wash and use of detergent soap, gels with the patient. I asked the patient to avoid touching mouth, nose, eyes, ears with the hands. I encouraged regular walking around the neighborhood and exercise and regular diet, as long as social distancing can be maintained.   Subjective:  Primary Physician/Referring:  Monico Blitz, MD  Patient ID: [name], female    DOB: 11/30/1945, 73 y.o.   MRN: [id number]  Chief Complaint  Patient presents with  . Hypertension  . Follow-up    HPI: [name]  is a 73 y.o. female  with History of chronic back pain and lumbar laminectomy in 2017, lumbar ago, degenerative joint disease, essential hypertension otherwise no significant cardiac history including hyperlipidemia, diabetes mellitus who was referred to me for evaluation of syncope that occurred on 10/13/2008, woke up from bed with tissue paper soaked in blood and then walked to the toilet to see blood stains and   realized she may have fallen and she was bleeding from her nose and also from her mouth.  No complete recollection of the event.  CT scan of the head was nondiagnostic and labs including CBC and BMP there within normal limits and hence discharged home.  She was also evaluated on 09/30/2018 when she presented to the emergency room with lightheadedness and syncope while standing in the kitchen and was noted to have occasional PACs, again CT scan of the head did not reveal any acute abnormality and is thought UTI and discharged home with Keflex.    I had seen her 6 weeks ago, she underwent echocardiogram and also an event monitor and nuclear stress test and presents here for a virtual visit.  Except for continued episodes of palpitations that are fairly frequent lasting a few minutes was scheduled seconds, associated with fullness in her throat and dyspnea, she has not had any recurrence of dizziness or syncope.  Past Medical History:  Diagnosis Date  . Anxiety   . Arthritis   . Depression   . Essential hypertension 10/25/2018  . Glaucoma   . Gout   . Hyperlipidemia   . Hypertension   . Syncope and collapse 10/25/2018    Past Surgical History:  Procedure Laterality Date  . APPENDECTOMY    . JOINT REPLACEMENT     both knees 2009 and shoulder reconstruction 2006  . KNEE ARTHROPLASTY Right   . KNEE ARTHROPLASTY Left   . LUMBAR LAMINECTOMY/DECOMPRESSION MICRODISCECTOMY N/A 11/28/2015   Procedure: LUMBAR LAMINECTOMY/ DECOMPRESSION MICRODISCECTOMY LUMBAR TWO-THREE, LUMBAR THREE-FOUR  ;  Surgeon: Ashok Pall, MD;  Location: [facility];  Service: Neurosurgery;  Laterality: N/A;  . PARTIAL HYSTERECTOMY    . SHOULDER SURGERY     reconstruction  . TONSILECTOMY, ADENOIDECTOMY, BILATERAL MYRINGOTOMY AND TUBES      Social History   Socioeconomic History  . Marital status: Widowed    Spouse name: Not on file  . Number of children: 1  . Years of education: Not on file  . Highest education level:  Not on file  Occupational History  . Not on file  Social Needs  . Financial resource strain: Not on file  . Food insecurity:    Worry: Not on file    Inability: Not on file  . Transportation needs:    Medical: Not on file    Non-medical: Not on file  Tobacco Use  . Smoking status: Never Smoker  . Smokeless tobacco: Never Used  Substance and Sexual Activity  . Alcohol use: No  . Drug use: No  . Sexual activity: Not on file  Lifestyle  . Physical activity:    Days per week: Not on file    Minutes per session: Not on file  . Stress: Not on file  Relationships  . Social connections:    Talks on phone: Not on file    Gets together: Not on file    Attends religious service: Not on file    Active member of club or organization: Not on file    Attends meetings of clubs or organizations: Not on file    Relationship status: Not on file  . Intimate partner violence:    Fear of current or ex partner: Not on file    Emotionally abused: Not on file    Physically abused: Not on file    Forced sexual activity: Not on file  Other Topics Concern  . Not on file  Social History Narrative  . Not on file    Current Outpatient Medications on File Prior to Visit  Medication Sig Dispense Refill  . ASTAXANTHIN PO Take 1 tablet by mouth daily.    . Biotin w/ Vitamins C & E (HAIR/SKIN/NAILS PO) Take 1 tablet by mouth daily.    Marland Kitchen buPROPion (WELLBUTRIN XL) 300 MG 24 hr tablet Take 300 mg by mouth daily.    . celecoxib (CELEBREX) 100 MG capsule Take 1 capsule (100 mg total) by mouth 2 (two) times daily. (Patient taking differently: Take 100 mg by mouth daily. ) 180 capsule 3  . CINNAMON PO Take 350 mg by mouth daily.     . Coenzyme Q10 (COQ10) 200 MG CAPS Take 200 mg by mouth daily.     . cyanocobalamin 500 MCG tablet Take 500 mcg by mouth daily.    . cyclobenzaprine (FLEXERIL) 10 MG tablet Take 1 tablet (10 mg total) by mouth 3 (three) times daily as needed for muscle spasms. 90 tablet 0  .  gabapentin (NEURONTIN) 100 MG capsule Take 1 capsule (100 mg total) by mouth 3 (three) times daily. (Patient taking differently: Take 100 mg by mouth as needed. ) 90 capsule 0  . HYDROcodone-acetaminophen (NORCO/VICODIN) 5-325 MG tablet Take 1 tablet by mouth every 6 (six) hours as needed for moderate pain. 20 tablet 0  . LORazepam (ATIVAN) 0.5 MG tablet Take 0.5 mg by mouth 2 (two) times daily as needed for anxiety.    . Omega-3 Fatty Acids (OMEGA 3 PO) Take 2,000 mg by mouth daily.     Marland Kitchen OVER THE COUNTER MEDICATION Take 1 tablet by mouth daily. Circulation and Vein,-  grapeseed, pine bark, horse chestnut, and butchers broom    . Propylene Glycol (SYSTANE BALANCE) 0.6 % SOLN Place 1 drop into both eyes daily as needed (for dry eyes).    Marland Kitchen Resveratrol 100 MG CAPS Take 100 mg by mouth daily.    . TRAVATAN Z 0.004 % SOLN ophthalmic solution Place 1 drop into both eyes at bedtime.     . triamterene-hydrochlorothiazide (MAXZIDE-25) 37.5-25 MG per tablet Take 1 tablet by mouth daily as needed (for fluid).     . TURMERIC PO Take 200 mg by mouth daily.    . valsartan (DIOVAN) 160 MG tablet Take 1 tablet (160 mg total) by mouth daily. 30 tablet 2  . Cholecalciferol (VITAMIN D-3 PO) Take 2,500 Units by mouth daily.     . diclofenac (VOLTAREN) 75 MG EC tablet Take 75 mg by mouth 2 (two) times daily.     Current Facility-Administered Medications on File Prior to Visit  Medication Dose Route Frequency Provider Last Rate Last Dose  . 0.9 %  sodium chloride infusion  500 mL Intravenous Continuous Nandigam, Kavitha V, MD        Review of Systems  Constitution: Negative for chills, decreased appetite, malaise/fatigue and weight gain.  Cardiovascular: Positive for palpitations. Negative for dyspnea on exertion, leg swelling and syncope.  Endocrine: Negative for cold intolerance.  Hematologic/Lymphatic: Does not bruise/bleed easily.  Musculoskeletal: Negative for joint swelling.  Gastrointestinal: Negative for  abdominal pain, anorexia and change in bowel habit.  Neurological: Negative for headaches and light-headedness.  Psychiatric/Behavioral: Negative for depression and substance abuse.  All other systems reviewed and are negative.     Objective:  Blood pressure (!) 150/78, pulse 62, height 5\' 3"  (1.6 m), weight 175 lb (79.4 kg). Body mass index is 31 kg/m.  Physical Exam Not performed due to telephone encounter.  Radiology: No results found.  Laboratory Examination:  CMP Latest Ref Rng & Units 11/08/2018 10/13/2018 09/30/2018  Glucose 65 - 99 mg/dL 106(H) 133(H) 111(H)  BUN 8 - 27 mg/dL 14 11 10   Creatinine 0.57 - 1.00 mg/dL 0.84 0.80 0.79  Sodium 134 - 144 mmol/L 142 140 138  Potassium 3.5 - 5.2 mmol/L 4.5 3.4(L) 3.1(L)  Chloride 96 - 106 mmol/L 100 106 104  CO2 20 - 29 mmol/L 24 26 24   Calcium 8.7 - 10.3 mg/dL 10.6(H) 9.7 9.7  Total Protein 6.5 - 8.1 g/dL - 7.2 -  Total Bilirubin 0.3 - 1.2 mg/dL - 1.0 -  Alkaline Phos 38 - 126 U/L - 68 -  AST 15 - 41 U/L - 24 -  ALT 0 - 44 U/L - 19 -   CBC Latest Ref Rng & Units 10/13/2018 09/30/2018 11/20/2015  WBC 4.0 - 10.5 K/uL 9.4 9.8 7.2  Hemoglobin 12.0 - 15.0 g/dL 15.6(H) 15.8(H) 14.6  Hematocrit 36.0 - 46.0 % 46.1(H) 47.2(H) 42.2  Platelets 150 - 400 K/uL 230 252 214   Lipid Panel  No results found for: CHOL, TRIG, HDL, CHOLHDL, VLDL, LDLCALC, LDLDIRECT HEMOGLOBIN A1C No results found for: HGBA1C, MPG TSH No results for input(s): TSH in the last 8760 hours.   Cardiac studies:   Event monitor 30 days 11-01-18: Predominant rhythm is normal sinus rhythm.  There were 9 events reported revealing normal sinus rhythm.  Occasional PACs were noted.  No atrial fibrillation, no heart block.  Echocardiogram at PCP   10/16/2018: LV size is normal, mild LVH, EF 10-27%, grade 1 diastolic dysfunction with elevated LVEDP.  Mildly calcified  aortic valve with mild aortic regurgitation and mild aortic valve stenosis.  Mild tricuspid regurgitation, no  pulmonary hypertension.  Exercise myoview stress 11/10/2018:  1. Lexiscan stress test was performed. Exercise capacity was not assessed. No stress symptoms reported. Resting blood pressure was 150/82 mmHg and peak effect blood pressure was 142/68 mmHg. Stress EKG is non diagnostic for ischemia as it is a pharmacologic stress.  2. The overall quality of the study is good. There is no evidence of abnormal lung activity. Stress and rest SPECT images demonstrate homogeneous tracer distribution throughout the myocardium. Gated SPECT imaging reveals normal myocardial thickening and wall motion. The left ventricular ejection fraction was normal (59%).   3. Low risk study.   Assessment:    Syncope and collapse  PVC (premature ventricular contraction)  Essential hypertension  EKG  10/25/2018: Sinus  Rhythm  At rate of 88 bpm,- occasional ectopic ventricular beat   Poor R-wave progression, cannot exclude -Old anterior infarct. I am very doubtful this is neurogenic syncope.  Recommendations:    Patient was seen by me about 4-6 weeks ago for episode of syncope, symptoms appear to be probably vasovagal as clear-cut etiology cannot be found, echocardiogram revealing very mild aortic stenosis which would not explain her symptoms and event monitor reveals occasional PACs.  Symptoms of palpitations are still present and they suggest PVCs.  Nuclear stress test was low risk.  I reviewed all results of the test with the patient and reassured her.  She will continue to abstain from driving for the next 6 months and can resume driving if there is no syncope after 6 months.  Blood pressure is still elevated, we discussed regarding using the 3rd agent for controlling her blood pressure versus increasing the dose of valsartan.  Patient would like to increase valsartan to 320 mg daily, she'll continue to monitor her blood pressure closely.  She will report to me if systolic blood pressures greater than 130-1 140 mmHg.   Otherwise unless she has recurrence of syncope or dizziness or worsening palpitations, she'll contact me sooner otherwise will see her back in 6 months.  She is wondering whether anxiety may be contributing to her palpitations, she was wondering whether she could take lorazepam twice a day which I have discouraged and advised her she is doing well by taking it once a day and could potentially use extra half dose medication occasionally if symptoms of palpitations are worse.  She'll follow-up with her PCP regarding lipid management, I do not have lipid profile on chart.   Adrian Prows, MD, [facility] 12/04/2018, 11:50 AM [facility] [place] Pager: [phone] Office: [phone] If no answer Cell [phone]

## 2018-12-11 ENCOUNTER — Ambulatory Visit: Payer: Federal, State, Local not specified - PPO | Admitting: Neurology

## 2018-12-18 ENCOUNTER — Other Ambulatory Visit: Payer: Self-pay | Admitting: Cardiology

## 2018-12-18 DIAGNOSIS — I10 Essential (primary) hypertension: Secondary | ICD-10-CM

## 2018-12-18 NOTE — Progress Notes (Unsigned)
Will order CMP and lipid. I had increased valsartan two weeks ago.

## 2018-12-19 DIAGNOSIS — I10 Essential (primary) hypertension: Secondary | ICD-10-CM | POA: Diagnosis not present

## 2019-01-03 NOTE — Progress Notes (Signed)
Primary Physician/Referring:  Monico Blitz, MD  Patient ID: [name], female    DOB: 09/19/1945, 73 y.o.   MRN: [id number]  Chief Complaint  Patient presents with  . Loss of Consciousness    4 WEEK F/U     HPI: [name]  is a 73 y.o. female  with History of chronic back pain and lumbar laminectomy in 2017, lumbar ago, degenerative joint disease, essential hypertension otherwise no significant cardiac history including hyperlipidemia, diabetes mellitus who was referred to me for evaluation of syncope that occurred on 10/13/2018,This will 2nd episode, she woke up in the morning with left shoulder pain and went to the bathroom and saw there was blood on the floor and in her mouth, she has no recollection.  1st episode occurred on  09/30/2018 when she presented to the emergency room with lightheadedness and syncope while standing in the kitchen and was noted to have occasional PACs.   During the first episode CT scan of the head did not reveal any acute abnormality and treated for UTI and discharged home with Keflex.  She is essentially had a normal echocardiogram on 10/16/2018 and a stress test and event monitor with occasional PACs.  Episode was felt to be probably vasovagal.  On her last office visit which had seen her on virtual visit, I increased the dose of valsartan 320 mg and obtain labs and she now presents for follow-up.  States that she's doing well, has not had any syncope, no palpitations that are worse.  Blood pressure is also well controlled per patient.  Past Medical History:  Diagnosis Date  . Anxiety   . Aortic stenosis   . Arthritis   . Depression   . Essential hypertension 10/25/2018  . Glaucoma   . Gout   . Hyperlipidemia   . Hypertension   . Syncope and collapse 10/25/2018    Past Surgical History:  Procedure Laterality Date  . APPENDECTOMY    . JOINT REPLACEMENT     both knees 2009 and shoulder reconstruction 2006  . KNEE ARTHROPLASTY Right    . KNEE ARTHROPLASTY Left   . LUMBAR LAMINECTOMY/DECOMPRESSION MICRODISCECTOMY N/A 11/28/2015   Procedure: LUMBAR LAMINECTOMY/ DECOMPRESSION MICRODISCECTOMY LUMBAR TWO-THREE, LUMBAR THREE-FOUR  ;  Surgeon: Ashok Pall, MD;  Location: [facility];  Service: Neurosurgery;  Laterality: N/A;  . PARTIAL HYSTERECTOMY    . SHOULDER SURGERY     reconstruction  . TONSILECTOMY, ADENOIDECTOMY, BILATERAL MYRINGOTOMY AND TUBES      Social History   Socioeconomic History  . Marital status: Widowed    Spouse name: Not on file  . Number of children: 1  . Years of education: Not on file  . Highest education level: Not on file  Occupational History  . Not on file  Social Needs  . Financial resource strain: Not on file  . Food insecurity:    Worry: Not on file    Inability: Not on file  . Transportation needs:    Medical: Not on file    Non-medical: Not on file  Tobacco Use  . Smoking status: Never Smoker  . Smokeless tobacco: Never Used  Substance and Sexual Activity  . Alcohol use: No  . Drug use: No  . Sexual activity: Not on file  Lifestyle  . Physical activity:    Days per week: Not on file    Minutes per session: Not on file  . Stress: Not on file  Relationships  . Social connections:  Talks on phone: Not on file    Gets together: Not on file    Attends religious service: Not on file    Active member of club or organization: Not on file    Attends meetings of clubs or organizations: Not on file    Relationship status: Not on file  . Intimate partner violence:    Fear of current or ex partner: Not on file    Emotionally abused: Not on file    Physically abused: Not on file    Forced sexual activity: Not on file  Other Topics Concern  . Not on file  Social History Narrative  . Not on file    Current Outpatient Medications on File Prior to Visit  Medication Sig Dispense Refill  . Biotin w/ Vitamins C & E (HAIR/SKIN/NAILS PO) Take 1 tablet by mouth daily.    Marland Kitchen buPROPion  (WELLBUTRIN XL) 300 MG 24 hr tablet Take 300 mg by mouth daily.    . celecoxib (CELEBREX) 100 MG capsule Take 1 capsule (100 mg total) by mouth 2 (two) times daily. (Patient taking differently: Take 100 mg by mouth daily. ) 180 capsule 3  . CINNAMON PO Take 350 mg by mouth daily.     . Coenzyme Q10 (COQ10) 200 MG CAPS Take 200 mg by mouth daily.     . cyclobenzaprine (FLEXERIL) 10 MG tablet Take 1 tablet (10 mg total) by mouth 3 (three) times daily as needed for muscle spasms. (Patient taking differently: Take 10 mg by mouth as needed for muscle spasms. ) 90 tablet 0  . gabapentin (NEURONTIN) 100 MG capsule Take 1 capsule (100 mg total) by mouth 3 (three) times daily. (Patient taking differently: Take 100 mg by mouth as needed. ) 90 capsule 0  . HYDROcodone-acetaminophen (NORCO/VICODIN) 5-325 MG tablet Take 1 tablet by mouth every 6 (six) hours as needed for moderate pain. 20 tablet 0  . LORazepam (ATIVAN) 0.5 MG tablet Take 0.5 mg by mouth 2 (two) times daily as needed for anxiety.    . Omega-3 Fatty Acids (OMEGA 3 PO) Take 2,000 mg by mouth daily.     Marland Kitchen OVER THE COUNTER MEDICATION Take 1 tablet by mouth daily. Circulation and Vein,- grapeseed, pine bark, horse chestnut, and butchers broom    . Propylene Glycol (SYSTANE BALANCE) 0.6 % SOLN Place 1 drop into both eyes daily as needed (for dry eyes).    Marland Kitchen Resveratrol 100 MG CAPS Take 100 mg by mouth daily.    . TRAVATAN Z 0.004 % SOLN ophthalmic solution Place 1 drop into both eyes at bedtime.     . TURMERIC PO Take 200 mg by mouth daily.    . valsartan (DIOVAN) 320 MG tablet Take 1 tablet (320 mg total) by mouth daily. 30 tablet 3  . ASTAXANTHIN PO Take 1 tablet by mouth daily.    Marland Kitchen triamterene-hydrochlorothiazide (MAXZIDE-25) 37.5-25 MG per tablet Take 1 tablet by mouth daily as needed (for fluid).      Current Facility-Administered Medications on File Prior to Visit  Medication Dose Route Frequency Provider Last Rate Last Dose  . 0.9 %  sodium  chloride infusion  500 mL Intravenous Continuous Nandigam, Kavitha V, MD        Review of Systems  Constitution: Negative for decreased appetite, malaise/fatigue, weight gain and weight loss.  Eyes: Negative for visual disturbance.  Cardiovascular: Positive for palpitations. Negative for chest pain, claudication, dyspnea on exertion, leg swelling, orthopnea and syncope.  Respiratory: Negative  for hemoptysis and wheezing.   Endocrine: Negative for cold intolerance and heat intolerance.  Hematologic/Lymphatic: Does not bruise/bleed easily.  Skin: Negative for nail changes.  Musculoskeletal: Negative for muscle weakness and myalgias.  Gastrointestinal: Negative for abdominal pain, change in bowel habit, nausea and vomiting.  Neurological: Negative for difficulty with concentration, dizziness, focal weakness and headaches.  Psychiatric/Behavioral: Negative for altered mental status and suicidal ideas.  All other systems reviewed and are negative.     Objective  Blood pressure 139/68, pulse (!) 58, height '5\' 3"'  (1.6 m), weight 175 lb (79.4 kg). Body mass index is 31 kg/m.   Physical exam not performed or limited due to virtual visit.  Patient appeared to be in no distress, Neck was supple, respiration was not labored.  Please see exam details from prior visit is as below.    Physical Exam  Constitutional: She appears well-developed. No distress.  Mildly obese  HENT:  Head: Atraumatic.  Eyes: Conjunctivae are normal.  Neck: Neck supple. No JVD present. No thyromegaly present.  Cardiovascular: Normal rate, regular rhythm, normal heart sounds and intact distal pulses. Frequent extrasystoles are present. Exam reveals no gallop.  No murmur heard. Pulmonary/Chest: Effort normal and breath sounds normal.  Abdominal: Soft. Bowel sounds are normal.  Musculoskeletal: Normal range of motion.        General: No edema.  Neurological: She is alert.  Skin: Skin is warm and dry.  Psychiatric: She  has a normal mood and affect.   Radiology: No results found.  Laboratory examination:   PCP labs 12/19/2018: Creatinine 0.8, potassium 4.0, eGFR 75/86, CMP normal. Cholesterol 183, triglycerides 43, HDL 59, LDL 115.   CMP Latest Ref Rng & Units 11/08/2018 10/13/2018 09/30/2018  Glucose 65 - 99 mg/dL 106(H) 133(H) 111(H)  BUN 8 - 27 mg/dL '14 11 10  ' Creatinine 0.57 - 1.00 mg/dL 0.84 0.80 0.79  Sodium 134 - 144 mmol/L 142 140 138  Potassium 3.5 - 5.2 mmol/L 4.5 3.4(L) 3.1(L)  Chloride 96 - 106 mmol/L 100 106 104  CO2 20 - 29 mmol/L '24 26 24  ' Calcium 8.7 - 10.3 mg/dL 10.6(H) 9.7 9.7  Total Protein 6.5 - 8.1 g/dL - 7.2 -  Total Bilirubin 0.3 - 1.2 mg/dL - 1.0 -  Alkaline Phos 38 - 126 U/L - 68 -  AST 15 - 41 U/L - 24 -  ALT 0 - 44 U/L - 19 -   CBC Latest Ref Rng & Units 10/13/2018 09/30/2018 11/20/2015  WBC 4.0 - 10.5 K/uL 9.4 9.8 7.2  Hemoglobin 12.0 - 15.0 g/dL 15.6(H) 15.8(H) 14.6  Hematocrit 36.0 - 46.0 % 46.1(H) 47.2(H) 42.2  Platelets 150 - 400 K/uL 230 252 214   Lipid Panel  No results found for: CHOL, TRIG, HDL, CHOLHDL, VLDL, LDLCALC, LDLDIRECT HEMOGLOBIN A1C No results found for: HGBA1C, MPG TSH No results for input(s): TSH in the last 8760 hours.  Cardiac Studies:   Event monitor 30 days 03-Nov-2018: Predominant rhythm is normal sinus rhythm.  There were 9 events reported revealing normal sinus rhythm.  Occasional PACs were noted.  No atrial fibrillation, no heart block.   Echocardiogram at PCP   10/16/2018: LV size is normal, mild LVH, EF 34-37%, grade 1 diastolic dysfunction with elevated LVEDP.  Mildly calcified aortic valve with mild aortic regurgitation and mild aortic valve stenosis.  Mild tricuspid regurgitation, no pulmonary hypertension.   Exercise myoview stress 11/10/2018:  1. Lexiscan stress test was performed. Exercise capacity was not assessed. No stress  symptoms reported. Resting blood pressure was 150/82 mmHg and peak effect blood pressure was 142/68 mmHg.  Stress EKG is non diagnostic for ischemia as it is a pharmacologic stress.  2. The overall quality of the study is good. There is no evidence of abnormal lung activity. Stress and rest SPECT images demonstrate homogeneous tracer distribution throughout the myocardium. Gated SPECT imaging reveals normal myocardial thickening and wall motion. The left ventricular ejection fraction was normal (59%).   3. Low risk study.    Assessment   Essential hypertension  PVC (premature ventricular contraction)  EKG  10/25/2018: Sinus  Rhythm  At rate of 88 bpm,- occasional ectopic ventricular beat   Poor R-wave progression, cannot exclude -Old anterior infarct. I am very doubtful this is neurogenic syncope.    Recommendations:   Patient initially seen for syncope suggestive of vasovagal syncope.  She has had negative cardiac workup.  I reviewed the results of the recently performed labs, she does have hyperlipidemia, very mildly abnormal.  She can try red yeast rice after dinner as she is hesitant to start statins but is willing to take it if necessary, worried about myalgia.  Blood pressure is now well controlled, no changes in the medications were done today.  I simply reassured her.  She continues to have palpitations suggestive of PVCs.  Again no therapy is indicated with this.  I'll see her back in 6 months for follow-up. She is now tolerating valsartan to 320 mg daily, she'll continue to monitor her blood pressure closely.   Palpitations are stable and there is no recurrence of syncope which was felt to be vasovagal in the past and not not arrhythmogenic.  Adrian Prows, MD, [facility] 01/06/2019, 8:32 AM [facility]. [place] Pager: [phone] Office: [phone] If no answer Cell [phone]

## 2019-01-04 ENCOUNTER — Other Ambulatory Visit: Payer: Self-pay

## 2019-01-04 ENCOUNTER — Ambulatory Visit (INDEPENDENT_AMBULATORY_CARE_PROVIDER_SITE_OTHER): Payer: Medicare Other | Admitting: Cardiology

## 2019-01-04 ENCOUNTER — Encounter: Payer: Self-pay | Admitting: Cardiology

## 2019-01-04 VITALS — BP 139/68 | HR 58 | Ht 63.0 in | Wt 175.0 lb

## 2019-01-04 DIAGNOSIS — I493 Ventricular premature depolarization: Secondary | ICD-10-CM | POA: Diagnosis not present

## 2019-01-04 DIAGNOSIS — I350 Nonrheumatic aortic (valve) stenosis: Secondary | ICD-10-CM | POA: Insufficient documentation

## 2019-01-04 DIAGNOSIS — I10 Essential (primary) hypertension: Secondary | ICD-10-CM

## 2019-01-06 ENCOUNTER — Encounter: Payer: Self-pay | Admitting: Cardiology

## 2019-01-23 ENCOUNTER — Other Ambulatory Visit: Payer: Self-pay

## 2019-01-31 ENCOUNTER — Ambulatory Visit: Payer: Medicare Other | Admitting: Neurology

## 2019-03-26 ENCOUNTER — Other Ambulatory Visit: Payer: Self-pay | Admitting: Orthopedic Surgery

## 2019-04-06 ENCOUNTER — Other Ambulatory Visit: Payer: Self-pay | Admitting: Cardiology

## 2019-04-06 DIAGNOSIS — I10 Essential (primary) hypertension: Secondary | ICD-10-CM

## 2019-04-10 ENCOUNTER — Other Ambulatory Visit: Payer: Self-pay

## 2019-04-13 DIAGNOSIS — M9903 Segmental and somatic dysfunction of lumbar region: Secondary | ICD-10-CM | POA: Diagnosis not present

## 2019-04-13 DIAGNOSIS — M5442 Lumbago with sciatica, left side: Secondary | ICD-10-CM | POA: Diagnosis not present

## 2019-04-13 DIAGNOSIS — M9905 Segmental and somatic dysfunction of pelvic region: Secondary | ICD-10-CM | POA: Diagnosis not present

## 2019-04-13 DIAGNOSIS — M9902 Segmental and somatic dysfunction of thoracic region: Secondary | ICD-10-CM | POA: Diagnosis not present

## 2019-04-16 DIAGNOSIS — M9905 Segmental and somatic dysfunction of pelvic region: Secondary | ICD-10-CM | POA: Diagnosis not present

## 2019-04-16 DIAGNOSIS — M9902 Segmental and somatic dysfunction of thoracic region: Secondary | ICD-10-CM | POA: Diagnosis not present

## 2019-04-16 DIAGNOSIS — M9903 Segmental and somatic dysfunction of lumbar region: Secondary | ICD-10-CM | POA: Diagnosis not present

## 2019-04-16 DIAGNOSIS — M5442 Lumbago with sciatica, left side: Secondary | ICD-10-CM | POA: Diagnosis not present

## 2019-04-18 DIAGNOSIS — M9903 Segmental and somatic dysfunction of lumbar region: Secondary | ICD-10-CM | POA: Diagnosis not present

## 2019-04-18 DIAGNOSIS — M5442 Lumbago with sciatica, left side: Secondary | ICD-10-CM | POA: Diagnosis not present

## 2019-04-18 DIAGNOSIS — M9902 Segmental and somatic dysfunction of thoracic region: Secondary | ICD-10-CM | POA: Diagnosis not present

## 2019-04-18 DIAGNOSIS — M9905 Segmental and somatic dysfunction of pelvic region: Secondary | ICD-10-CM | POA: Diagnosis not present

## 2019-04-20 DIAGNOSIS — M9903 Segmental and somatic dysfunction of lumbar region: Secondary | ICD-10-CM | POA: Diagnosis not present

## 2019-04-20 DIAGNOSIS — M9902 Segmental and somatic dysfunction of thoracic region: Secondary | ICD-10-CM | POA: Diagnosis not present

## 2019-04-20 DIAGNOSIS — M5442 Lumbago with sciatica, left side: Secondary | ICD-10-CM | POA: Diagnosis not present

## 2019-04-20 DIAGNOSIS — M9905 Segmental and somatic dysfunction of pelvic region: Secondary | ICD-10-CM | POA: Diagnosis not present

## 2019-04-26 DIAGNOSIS — Z0000 Encounter for general adult medical examination without abnormal findings: Secondary | ICD-10-CM | POA: Diagnosis not present

## 2019-04-26 DIAGNOSIS — E785 Hyperlipidemia, unspecified: Secondary | ICD-10-CM | POA: Diagnosis not present

## 2019-04-26 DIAGNOSIS — Z1339 Encounter for screening examination for other mental health and behavioral disorders: Secondary | ICD-10-CM | POA: Diagnosis not present

## 2019-04-26 DIAGNOSIS — R5383 Other fatigue: Secondary | ICD-10-CM | POA: Diagnosis not present

## 2019-04-26 DIAGNOSIS — E559 Vitamin D deficiency, unspecified: Secondary | ICD-10-CM | POA: Diagnosis not present

## 2019-04-26 DIAGNOSIS — Z6830 Body mass index (BMI) 30.0-30.9, adult: Secondary | ICD-10-CM | POA: Diagnosis not present

## 2019-04-26 DIAGNOSIS — Z7189 Other specified counseling: Secondary | ICD-10-CM | POA: Diagnosis not present

## 2019-04-26 DIAGNOSIS — Z1211 Encounter for screening for malignant neoplasm of colon: Secondary | ICD-10-CM | POA: Diagnosis not present

## 2019-04-26 DIAGNOSIS — Z1331 Encounter for screening for depression: Secondary | ICD-10-CM | POA: Diagnosis not present

## 2019-04-26 DIAGNOSIS — Z299 Encounter for prophylactic measures, unspecified: Secondary | ICD-10-CM | POA: Diagnosis not present

## 2019-04-26 DIAGNOSIS — Z79899 Other long term (current) drug therapy: Secondary | ICD-10-CM | POA: Diagnosis not present

## 2019-04-26 DIAGNOSIS — I10 Essential (primary) hypertension: Secondary | ICD-10-CM | POA: Diagnosis not present

## 2019-04-26 DIAGNOSIS — F419 Anxiety disorder, unspecified: Secondary | ICD-10-CM | POA: Diagnosis not present

## 2019-04-27 DIAGNOSIS — M9903 Segmental and somatic dysfunction of lumbar region: Secondary | ICD-10-CM | POA: Diagnosis not present

## 2019-04-27 DIAGNOSIS — M5442 Lumbago with sciatica, left side: Secondary | ICD-10-CM | POA: Diagnosis not present

## 2019-04-27 DIAGNOSIS — M9902 Segmental and somatic dysfunction of thoracic region: Secondary | ICD-10-CM | POA: Diagnosis not present

## 2019-04-27 DIAGNOSIS — M9905 Segmental and somatic dysfunction of pelvic region: Secondary | ICD-10-CM | POA: Diagnosis not present

## 2019-05-04 DIAGNOSIS — M9905 Segmental and somatic dysfunction of pelvic region: Secondary | ICD-10-CM | POA: Diagnosis not present

## 2019-05-04 DIAGNOSIS — M9903 Segmental and somatic dysfunction of lumbar region: Secondary | ICD-10-CM | POA: Diagnosis not present

## 2019-05-04 DIAGNOSIS — M9902 Segmental and somatic dysfunction of thoracic region: Secondary | ICD-10-CM | POA: Diagnosis not present

## 2019-05-04 DIAGNOSIS — M5442 Lumbago with sciatica, left side: Secondary | ICD-10-CM | POA: Diagnosis not present

## 2019-05-08 ENCOUNTER — Other Ambulatory Visit: Payer: Self-pay

## 2019-05-08 DIAGNOSIS — I10 Essential (primary) hypertension: Secondary | ICD-10-CM

## 2019-05-08 MED ORDER — VALSARTAN 320 MG PO TABS: 320.0000 mg | ORAL_TABLET | Freq: Every day | ORAL | 3 refills | Status: DC

## 2019-05-30 DIAGNOSIS — D1801 Hemangioma of skin and subcutaneous tissue: Secondary | ICD-10-CM | POA: Diagnosis not present

## 2019-05-30 DIAGNOSIS — C44329 Squamous cell carcinoma of skin of other parts of face: Secondary | ICD-10-CM | POA: Diagnosis not present

## 2019-05-30 DIAGNOSIS — L738 Other specified follicular disorders: Secondary | ICD-10-CM | POA: Diagnosis not present

## 2019-05-30 DIAGNOSIS — L819 Disorder of pigmentation, unspecified: Secondary | ICD-10-CM | POA: Diagnosis not present

## 2019-05-30 DIAGNOSIS — D485 Neoplasm of uncertain behavior of skin: Secondary | ICD-10-CM | POA: Diagnosis not present

## 2019-06-14 DIAGNOSIS — C44329 Squamous cell carcinoma of skin of other parts of face: Secondary | ICD-10-CM | POA: Diagnosis not present

## 2019-07-10 ENCOUNTER — Other Ambulatory Visit: Payer: Self-pay | Admitting: Orthopedic Surgery

## 2019-07-10 DIAGNOSIS — M5430 Sciatica, unspecified side: Secondary | ICD-10-CM

## 2019-07-10 MED ORDER — GABAPENTIN 100 MG PO CAPS: 100.0000 mg | ORAL_CAPSULE | Freq: Three times a day (TID) | ORAL | 0 refills | Status: DC

## 2019-07-10 NOTE — Telephone Encounter (Signed)
Patient called to request refill on medication prescribed by Dr Aline Brochure in 2017; has been seen since then; also has scheduled appointment with Dr Aline Brochure 07/30/19. Please advise: gabapentin (NEURONTIN) 100 MG capsule 90 capsule  - [facility]

## 2019-07-10 NOTE — Telephone Encounter (Signed)
See message, Rx refill requested. Thanks.

## 2019-07-16 ENCOUNTER — Encounter: Payer: Self-pay | Admitting: Cardiology

## 2019-07-16 ENCOUNTER — Other Ambulatory Visit: Payer: Self-pay

## 2019-07-16 ENCOUNTER — Telehealth (INDEPENDENT_AMBULATORY_CARE_PROVIDER_SITE_OTHER): Payer: Medicare Other | Admitting: Cardiology

## 2019-07-16 VITALS — BP 149/75 | Ht 63.0 in

## 2019-07-16 DIAGNOSIS — I493 Ventricular premature depolarization: Secondary | ICD-10-CM | POA: Diagnosis not present

## 2019-07-16 DIAGNOSIS — I10 Essential (primary) hypertension: Secondary | ICD-10-CM | POA: Diagnosis not present

## 2019-07-16 DIAGNOSIS — R002 Palpitations: Secondary | ICD-10-CM | POA: Diagnosis not present

## 2019-07-16 MED ORDER — AMLODIPINE BESYLATE 5 MG PO TABS: 5.0000 mg | ORAL_TABLET | Freq: Every day | ORAL | 2 refills | Status: DC

## 2019-07-16 NOTE — Progress Notes (Signed)
Virtual Visit via Video Note: This visit type was conducted due to national recommendations for restrictions regarding the COVID-19 Pandemic (e.g. social distancing).  This format is felt to be most appropriate for this patient at this time.  All issues noted in this document were discussed and addressed.  No physical exam was performed (except for noted visual exam findings with Telehealth visits).  The patient has consented to conduct a Telehealth visit and understands insurance will be billed.   I connected with@, on 07/16/19 at  by a video enabled telemedicine application and verified that I am speaking with the correct person using two identifiers.   I discussed the limitations of evaluation and management by telemedicine and the availability of in person appointments. The patient expressed understanding and agreed to proceed.   I have discussed with patient regarding the safety during COVID Pandemic and steps and precautions to be taken including social distancing, frequent hand wash and use of detergent soap, gels with the patient. I asked the patient to avoid touching mouth, nose, eyes, ears with the hands. I encouraged regular walking around the neighborhood and exercise and regular diet, as long as social distancing can be maintained.  Primary Physician/Referring:  Monico Blitz, MD  Patient ID: [name], female    DOB: 1945-11-15, 73 y.o.   MRN: [id number]  Chief Complaint  Patient presents with  . Hypertension    palpitations  . Loss of Consciousness    HPI: [name]  is a 73 y.o. female  with History of chronic back pain and lumbar laminectomy in 2017, lumbar ago, degenerative joint disease, essential hypertension, mild hyperlipidemia, diabetes mellitus presents for follow-up of palpitations, near syncope and hypertension, this is a 50-monthoffice visit.  On her last office visit she was doing well with increased dose of valsartan 320 mg daily.  She still  continues to have occasional palpitations especially in the morning but states that these are remained stable.  She continues to have severe back pain and has been taking pain medications.  She also requests a note for the postal department to move her mailbox from the opposite end of her home on the street to her side for safety.  She denies any dizziness, chest pain.  Previously she was evaluated for syncope that occurred on 10/13/2018, 2nd episode, she woke up in the morning with left shoulder pain and went to the bathroom and saw there was blood on the floor and in her mouth, she has no recollection.  1st episode occurred on  09/30/2018 when she presented to the emergency room with lightheadedness and syncope while standing in the kitchen and was noted to have occasional PACs.   She has not had any further recurrence. She is essentially had a normal echocardiogram on 10/16/2018 and a stress test and event monitor with occasional PACs.  Episode was felt to be probably vasovagal.   Past Medical History:  Diagnosis Date  . Anxiety   . Aortic stenosis   . Arthritis   . Depression   . Essential hypertension 10/25/2018  . Glaucoma   . Gout   . Hyperlipidemia   . Hypertension   . Syncope and collapse 10/25/2018    Past Surgical History:  Procedure Laterality Date  . APPENDECTOMY    . JOINT REPLACEMENT     both knees 2009 and shoulder reconstruction 2006  . KNEE ARTHROPLASTY Right   . KNEE ARTHROPLASTY Left   . LUMBAR LAMINECTOMY/DECOMPRESSION MICRODISCECTOMY N/A 11/28/2015  Procedure: LUMBAR LAMINECTOMY/ DECOMPRESSION MICRODISCECTOMY LUMBAR TWO-THREE, LUMBAR THREE-FOUR  ;  Surgeon: Ashok Pall, MD;  Location: [facility];  Service: Neurosurgery;  Laterality: N/A;  . PARTIAL HYSTERECTOMY    . SHOULDER SURGERY     reconstruction  . TONSILECTOMY, ADENOIDECTOMY, BILATERAL MYRINGOTOMY AND TUBES      Social History   Socioeconomic History  . Marital status: Widowed    Spouse name: Not on  file  . Number of children: 1  . Years of education: Not on file  . Highest education level: Not on file  Occupational History  . Not on file  Social Needs  . Financial resource strain: Not on file  . Food insecurity    Worry: Not on file    Inability: Not on file  . Transportation needs    Medical: Not on file    Non-medical: Not on file  Tobacco Use  . Smoking status: Never Smoker  . Smokeless tobacco: Never Used  Substance and Sexual Activity  . Alcohol use: No  . Drug use: No  . Sexual activity: Not on file  Lifestyle  . Physical activity    Days per week: Not on file    Minutes per session: Not on file  . Stress: Not on file  Relationships  . Social Herbalist on phone: Not on file    Gets together: Not on file    Attends religious service: Not on file    Active member of club or organization: Not on file    Attends meetings of clubs or organizations: Not on file    Relationship status: Not on file  . Intimate partner violence    Fear of current or ex partner: Not on file    Emotionally abused: Not on file    Physically abused: Not on file    Forced sexual activity: Not on file  Other Topics Concern  . Not on file  Social History Narrative  . Not on file    Current Outpatient Medications on File Prior to Visit  Medication Sig Dispense Refill  . ASTAXANTHIN PO Take 1 tablet by mouth daily.    . Biotin w/ Vitamins C & E (HAIR/SKIN/NAILS PO) Take 1 tablet by mouth daily.    Marland Kitchen buPROPion (WELLBUTRIN XL) 300 MG 24 hr tablet Take 300 mg by mouth daily.    . celecoxib (CELEBREX) 100 MG capsule Take 1 capsule (100 mg total) by mouth daily. 90 capsule 5  . CINNAMON PO Take 350 mg by mouth daily.     . Coenzyme Q10 (COQ10) 200 MG CAPS Take 200 mg by mouth daily.     . cyclobenzaprine (FLEXERIL) 10 MG tablet Take 1 tablet (10 mg total) by mouth 3 (three) times daily as needed for muscle spasms. (Patient taking differently: Take 10 mg by mouth as needed for  muscle spasms. ) 90 tablet 0  . gabapentin (NEURONTIN) 100 MG capsule Take 1 capsule (100 mg total) by mouth 3 (three) times daily. 90 capsule 0  . HYDROcodone-acetaminophen (NORCO/VICODIN) 5-325 MG tablet Take 1 tablet by mouth every 6 (six) hours as needed for moderate pain. 20 tablet 0  . LORazepam (ATIVAN) 0.5 MG tablet Take 0.5 mg by mouth 2 (two) times daily as needed for anxiety.    . Omega-3 Fatty Acids (OMEGA 3 PO) Take 2,000 mg by mouth daily.     Marland Kitchen OVER THE COUNTER MEDICATION Take 1 tablet by mouth daily. Circulation and Vein,- grapeseed, pine  bark, horse chestnut, and butchers broom    . Propylene Glycol (SYSTANE BALANCE) 0.6 % SOLN Place 1 drop into both eyes daily as needed (for dry eyes).    Marland Kitchen Resveratrol 100 MG CAPS Take 100 mg by mouth daily.    . TRAVATAN Z 0.004 % SOLN ophthalmic solution Place 1 drop into both eyes at bedtime.     . TURMERIC PO Take 200 mg by mouth daily.    . valsartan (DIOVAN) 320 MG tablet Take 1 tablet (320 mg total) by mouth daily. 30 tablet 3   Current Facility-Administered Medications on File Prior to Visit  Medication Dose Route Frequency Provider Last Rate Last Dose  . 0.9 %  sodium chloride infusion  500 mL Intravenous Continuous Nandigam, Kavitha V, MD        Review of Systems  Constitution: Negative for decreased appetite, malaise/fatigue, weight gain and weight loss.  Eyes: Negative for visual disturbance.  Cardiovascular: Positive for palpitations. Negative for chest pain, claudication, dyspnea on exertion, leg swelling, orthopnea and syncope.  Respiratory: Negative for hemoptysis and wheezing.   Endocrine: Negative for cold intolerance and heat intolerance.  Hematologic/Lymphatic: Does not bruise/bleed easily.  Skin: Negative for nail changes.  Musculoskeletal: Negative for muscle weakness and myalgias.  Gastrointestinal: Negative for abdominal pain, change in bowel habit, nausea and vomiting.  Neurological: Negative for difficulty with  concentration, dizziness, focal weakness and headaches.  Psychiatric/Behavioral: Negative for altered mental status and suicidal ideas.  All other systems reviewed and are negative.     Objective  Blood pressure (!) 149/75, height '5\' 3"'  (1.6 m). Body mass index is 31 kg/m.   Physical exam not performed or limited due to virtual visit.  Patient appeared to be in no distress, Neck was supple, respiration was not labored.  Please see exam details from prior visit is as below.    Physical Exam  Constitutional: She appears well-developed. No distress.  Mildly obese  HENT:  Head: Atraumatic.  Eyes: Conjunctivae are normal.  Neck: Neck supple. No JVD present. No thyromegaly present.  Cardiovascular: Normal rate, regular rhythm, normal heart sounds and intact distal pulses. Frequent extrasystoles are present. Exam reveals no gallop.  No murmur heard. Pulmonary/Chest: Effort normal and breath sounds normal.  Abdominal: Soft. Bowel sounds are normal.  Musculoskeletal: Normal range of motion.        General: No edema.  Neurological: She is alert.  Skin: Skin is warm and dry.  Psychiatric: She has a normal mood and affect.   Radiology: No results found.  Laboratory examination:   PCP labs 12/19/2018: Creatinine 0.8, potassium 4.0, eGFR 75/86, CMP normal. Cholesterol 183, triglycerides 43, HDL 59, LDL 115.   CMP Latest Ref Rng & Units 11/08/2018 10/13/2018 09/30/2018  Glucose 65 - 99 mg/dL 106(H) 133(H) 111(H)  BUN 8 - 27 mg/dL '14 11 10  ' Creatinine 0.57 - 1.00 mg/dL 0.84 0.80 0.79  Sodium 134 - 144 mmol/L 142 140 138  Potassium 3.5 - 5.2 mmol/L 4.5 3.4(L) 3.1(L)  Chloride 96 - 106 mmol/L 100 106 104  CO2 20 - 29 mmol/L '24 26 24  ' Calcium 8.7 - 10.3 mg/dL 10.6(H) 9.7 9.7  Total Protein 6.5 - 8.1 g/dL - 7.2 -  Total Bilirubin 0.3 - 1.2 mg/dL - 1.0 -  Alkaline Phos 38 - 126 U/L - 68 -  AST 15 - 41 U/L - 24 -  ALT 0 - 44 U/L - 19 -   CBC Latest Ref Rng & Units 10/13/2018  09/30/2018  11/20/2015  WBC 4.0 - 10.5 K/uL 9.4 9.8 7.2  Hemoglobin 12.0 - 15.0 g/dL 15.6(H) 15.8(H) 14.6  Hematocrit 36.0 - 46.0 % 46.1(H) 47.2(H) 42.2  Platelets 150 - 400 K/uL 230 252 214   Lipid Panel  No results found for: CHOL, TRIG, HDL, CHOLHDL, VLDL, LDLCALC, LDLDIRECT HEMOGLOBIN A1C No results found for: HGBA1C, MPG TSH No results for input(s): TSH in the last 8760 hours.  Cardiac Studies:   Event monitor 30 days 2018/11/10: Predominant rhythm is normal sinus rhythm.  There were 9 events reported revealing normal sinus rhythm.  Occasional PACs were noted.  No atrial fibrillation, no heart block.   Echocardiogram at PCP   10/16/2018: LV size is normal, mild LVH, EF 83-72%, grade 1 diastolic dysfunction with elevated LVEDP.  Mildly calcified aortic valve with mild aortic regurgitation and mild aortic valve stenosis.  Mild tricuspid regurgitation, no pulmonary hypertension.   Exercise myoview stress 11/10/2018:  1. Lexiscan stress test was performed. Exercise capacity was not assessed. No stress symptoms reported. Resting blood pressure was 150/82 mmHg and peak effect blood pressure was 142/68 mmHg. Stress EKG is non diagnostic for ischemia as it is a pharmacologic stress.  2. The overall quality of the study is good. There is no evidence of abnormal lung activity. Stress and rest SPECT images demonstrate homogeneous tracer distribution throughout the myocardium. Gated SPECT imaging reveals normal myocardial thickening and wall motion. The left ventricular ejection fraction was normal (59%).   3. Low risk study.    Assessment   PVC (premature ventricular contraction)  Essential hypertension - Plan: amLODipine (NORVASC) 5 MG tablet  Palpitations  EKG  2018-11-10: Sinus  Rhythm  At rate of 88 bpm,- occasional ectopic ventricular beat   Poor R-wave progression, cannot exclude -Old anterior infarct. I am very doubtful this is neurogenic syncope.    Recommendations:   [name]   is a 73 y.o.  female  with History of chronic back pain and lumbar laminectomy in 11/10/15, lumbar ago, degenerative joint disease, essential hypertension, mild hyperlipidemia, diabetes mellitus presents for follow-up of palpitations, near syncope and hypertension, this is a 85-monthoffice visit.  She has not had any recurrence of syncope since February 2020, echocardiogram and stress test were unremarkable at that time.  She still continues to have occasional palpitations.  She continues to have severe back pain and sciatica.  Systolic blood pressure is elevated today and also on a regular basis.  Added amlodipine 5 mg daily.  I had like to see her back in 6 weeks for follow-up of hypertension, if she remains stable I will see him back on a as needed basis.  She requests a letter to the postal department to her mailbox from across the street to her side of the street due to safety reasons which I will oblige.  JAdrian Prows MD, FHouston Methodist Continuing Care Hospital11/23/2020, 10:51 AM PKings MillsCardiovascular. PQuasquetonPager: [phone] Office: [phone] no answer Cell [phone]

## 2019-07-30 ENCOUNTER — Ambulatory Visit: Payer: Federal, State, Local not specified - PPO | Admitting: Orthopedic Surgery

## 2019-08-06 ENCOUNTER — Ambulatory Visit: Payer: Medicare Other

## 2019-08-06 ENCOUNTER — Ambulatory Visit (INDEPENDENT_AMBULATORY_CARE_PROVIDER_SITE_OTHER): Payer: Medicare Other | Admitting: Orthopedic Surgery

## 2019-08-06 ENCOUNTER — Encounter: Payer: Self-pay | Admitting: Orthopedic Surgery

## 2019-08-06 ENCOUNTER — Other Ambulatory Visit: Payer: Self-pay

## 2019-08-06 VITALS — BP 140/67 | HR 75 | Ht 63.0 in | Wt 180.0 lb

## 2019-08-06 DIAGNOSIS — M545 Low back pain, unspecified: Secondary | ICD-10-CM

## 2019-08-06 DIAGNOSIS — M79605 Pain in left leg: Secondary | ICD-10-CM

## 2019-08-06 NOTE — Progress Notes (Signed)
[name]  08/06/2019  Body mass index is 31.89 kg/m.   HISTORY SECTION :  Chief Complaint  Patient presents with  . Back Pain    left sided sciatica    HPI [name] is an established patient longtime with a new problem.  She called in for some sciatic I put her on gabapentin she says she has improved significantly  However, on or about Thanksgiving or so she started having pain in her left lower extremity with radiation down the leg mostly to the knee but occasionally to the foot.  She denied any trauma at that time but had severe pain.  Again the gabapentin seems to have helped.  She is not having any weakness in the left lower extremity although she continues to have some intermittent dull achy back pain.    She had surgery on her back by Dr. Cyndy Freeze on the right side said it went great  Review of Systems  Constitutional: Negative for chills and fever.  Musculoskeletal: Positive for joint pain and neck pain.       Patient fell backward injured her head and states she had some bleeding several weeks back.  She has had some stiffness in the cervical spine but denies any numbness tingling weakness in the upper extremities just stiffness in the neck  Neurological: Negative for tingling, sensory change and weakness.     has a past medical history of Anxiety, Aortic stenosis, Arthritis, Depression, Essential hypertension (10/25/2018), Glaucoma, Gout, Hyperlipidemia, Hypertension, and Syncope and collapse (10/25/2018).   Past Surgical History:  Procedure Laterality Date  . APPENDECTOMY    . JOINT REPLACEMENT     both knees 2009 and shoulder reconstruction 2006  . KNEE ARTHROPLASTY Right   . KNEE ARTHROPLASTY Left   . LUMBAR LAMINECTOMY/DECOMPRESSION MICRODISCECTOMY N/A 11/28/2015   Procedure: LUMBAR LAMINECTOMY/ DECOMPRESSION MICRODISCECTOMY LUMBAR TWO-THREE, LUMBAR THREE-FOUR  ;  Surgeon: Ashok Pall, MD;  Location: [facility];  Service: Neurosurgery;  Laterality: N/A;  .  PARTIAL HYSTERECTOMY    . SHOULDER SURGERY     reconstruction  . TONSILECTOMY, ADENOIDECTOMY, BILATERAL MYRINGOTOMY AND TUBES      Body mass index is 31.89 kg/m.   Allergies  Allergen Reactions  . Sulfonamide Derivatives Itching and Swelling    Redness     Current Outpatient Medications:  .  amLODipine (NORVASC) 5 MG tablet, Take 1 tablet (5 mg total) by mouth daily in the afternoon., Disp: 30 tablet, Rfl: 2 .  Biotin w/ Vitamins C & E (HAIR/SKIN/NAILS PO), Take 1 tablet by mouth daily., Disp: , Rfl:  .  buPROPion (WELLBUTRIN XL) 300 MG 24 hr tablet, Take 300 mg by mouth daily., Disp: , Rfl:  .  celecoxib (CELEBREX) 100 MG capsule, Take 1 capsule (100 mg total) by mouth daily., Disp: 90 capsule, Rfl: 5 .  CINNAMON PO, Take 350 mg by mouth daily. , Disp: , Rfl:  .  Coenzyme Q10 (COQ10) 200 MG CAPS, Take 200 mg by mouth daily. , Disp: , Rfl:  .  gabapentin (NEURONTIN) 100 MG capsule, Take 1 capsule (100 mg total) by mouth 3 (three) times daily., Disp: 90 capsule, Rfl: 0 .  LORazepam (ATIVAN) 0.5 MG tablet, Take 0.5 mg by mouth 2 (two) times daily as needed for anxiety., Disp: , Rfl:  .  Omega-3 Fatty Acids (OMEGA 3 PO), Take 2,000 mg by mouth daily. , Disp: , Rfl:  .  OVER THE COUNTER MEDICATION, Take 1 tablet by mouth daily. Circulation and Vein,-  grapeseed, pine bark, horse chestnut, and butchers broom, Disp: , Rfl:  .  Resveratrol 100 MG CAPS, Take 100 mg by mouth daily., Disp: , Rfl:  .  TRAVATAN Z 0.004 % SOLN ophthalmic solution, Place 1 drop into both eyes at bedtime. , Disp: , Rfl:  .  TURMERIC PO, Take 200 mg by mouth daily., Disp: , Rfl:  .  valsartan (DIOVAN) 320 MG tablet, Take 1 tablet (320 mg total) by mouth daily., Disp: 30 tablet, Rfl: 3 .  ASTAXANTHIN PO, Take 1 tablet by mouth daily., Disp: , Rfl:  .  cyclobenzaprine (FLEXERIL) 10 MG tablet, Take 1 tablet (10 mg total) by mouth 3 (three) times daily as needed for muscle spasms. (Patient not taking: Reported on  08/06/2019), Disp: 90 tablet, Rfl: 0 .  HYDROcodone-acetaminophen (NORCO/VICODIN) 5-325 MG tablet, Take 1 tablet by mouth every 6 (six) hours as needed for moderate pain. (Patient not taking: Reported on 08/06/2019), Disp: 20 tablet, Rfl: 0 .  Propylene Glycol (SYSTANE BALANCE) 0.6 % SOLN, Place 1 drop into both eyes daily as needed (for dry eyes)., Disp: , Rfl:   Current Facility-Administered Medications:  .  0.9 %  sodium chloride infusion, 500 mL, Intravenous, Continuous, Nandigam, Kavitha V, MD   PHYSICAL EXAM SECTION: 1) BP 140/67   Pulse 75   Ht 5\' 3"  (1.6 m)   Wt 180 lb (81.6 kg)   BMI 31.89 kg/m   Body mass index is 31.89 kg/m. General appearance: Well-developed well-nourished no gross deformities  2) Cardiovascular normal pulse and perfusion in the upper and lower extremities normal color, she does have some mild edema bilaterally 3) Neurologically deep tendon reflexes are equal and normal, no sensation loss or deficits no pathologic reflexes  4) Psychological: Awake alert and oriented x3 mood and affect normal  5) Skin no lacerations or ulcerations no nodularity no palpable masses, no erythema or nodularity  6) Musculoskeletal:  Neck decreased rotation to the right normal to the left  Lower extremity strength is normal straight leg raise is negative left and right     MEDICAL DECISION SECTION:  Encounter Diagnosis  Name Primary?  . Low back pain radiating to left leg Yes    Imaging X-rays lumbar spine see report but she has significant spondylosis without spondylolysis or listhesis she has multilevel disc disease evidenced by multilevel osteophyte formation.  Loss of lumbar lordosis is noted and coronal plane alignment is abnormal these appear to be chronic  Plan:  (Rx., Inj., surg., Frx, MRI/CT, XR:2) We offered her physical therapy but she declined at this time she says if the neck gets worse she will let us know  She will continue on gabapentin seems to be  helping I told her if that does not continue to work for her to let us know.  At some point if this continues of course we would have her see the neurosurgeons for definitive management   2:19 PM Arther Abbott, MD  08/06/2019

## 2019-08-06 NOTE — Patient Instructions (Signed)
Continue the gabapentin as needed  If the neck situation gets worse let us know we can always schedule an appointment to see Dr. Cyndy Freeze for the spine issues  If the gabapentin stops working please let us know as well.

## 2019-08-30 ENCOUNTER — Encounter: Payer: Self-pay | Admitting: Cardiology

## 2019-08-30 ENCOUNTER — Telehealth (INDEPENDENT_AMBULATORY_CARE_PROVIDER_SITE_OTHER): Payer: Medicare Other | Admitting: Cardiology

## 2019-08-30 ENCOUNTER — Other Ambulatory Visit: Payer: Self-pay

## 2019-08-30 VITALS — BP 161/77 | HR 57 | Ht 63.0 in | Wt 181.0 lb

## 2019-08-30 DIAGNOSIS — I10 Essential (primary) hypertension: Secondary | ICD-10-CM

## 2019-08-30 MED ORDER — CHLORTHALIDONE 25 MG PO TABS: 12.5000 mg | ORAL_TABLET | Freq: Every morning | ORAL | 2 refills | Status: DC

## 2019-08-30 MED ORDER — AMLODIPINE BESYLATE 10 MG PO TABS: 10.0000 mg | ORAL_TABLET | Freq: Every day | ORAL | 2 refills | Status: DC

## 2019-08-30 NOTE — Progress Notes (Signed)
I connected with  [name] on 08/30/19 by a video enabled telemedicine application and verified that I am speaking with the correct person using two identifiers.   I discussed the limitations of evaluation and management by telemedicine. The patient expressed understanding and agreed to proceed.   Primary Physician/Referring:  Monico Blitz, MD  Patient ID: [name], female    DOB: Jun 01, 1946, 74 y.o.   MRN: [id number]  Chief Complaint  Patient presents with  . Hypertension   HPI: [name]  is a 74 y.o. female  with History of chronic back pain and lumbar laminectomy in 2017, lumbar ago, degenerative joint disease, essential hypertension, mild hyperlipidemia, diabetes mellitus presents for follow-up of hypertension.  Previously she was evaluated for syncope that occurred on 10/13/2018, 2nd episode, she woke up in the morning with left shoulder pain and went to the bathroom and saw there was blood on the floor and in her mouth, she has no recollection.  1st episode occurred on  09/30/2018 when she presented to the emergency room with lightheadedness and syncope while standing in the kitchen and was noted to have occasional PACs.   She has not had any further recurrence.   Complains of cough and attributes this to probably valsartan.  Also states blood pressure has not been well-controlled.  She continues to have severe back pain.  Past Medical History:  Diagnosis Date  . Anxiety   . Aortic stenosis   . Arthritis   . Depression   . Essential hypertension 10/25/2018  . Glaucoma   . Gout   . Hyperlipidemia   . Hypertension   . Syncope and collapse 10/25/2018    Past Surgical History:  Procedure Laterality Date  . APPENDECTOMY    . JOINT REPLACEMENT     both knees 2009 and shoulder reconstruction 2006  . KNEE ARTHROPLASTY Right   . KNEE ARTHROPLASTY Left   . LUMBAR LAMINECTOMY/DECOMPRESSION MICRODISCECTOMY N/A 11/28/2015   Procedure: LUMBAR  LAMINECTOMY/ DECOMPRESSION MICRODISCECTOMY LUMBAR TWO-THREE, LUMBAR THREE-FOUR  ;  Surgeon: Ashok Pall, MD;  Location: [facility];  Service: Neurosurgery;  Laterality: N/A;  . PARTIAL HYSTERECTOMY    . SHOULDER SURGERY     reconstruction  . TONSILECTOMY, ADENOIDECTOMY, BILATERAL MYRINGOTOMY AND TUBES     Social History   Tobacco Use  . Smoking status: Never Smoker  . Smokeless tobacco: Never Used  Substance Use Topics  . Alcohol use: No    Current Meds  Medication Sig  . amLODipine (NORVASC) 10 MG tablet Take 1 tablet (10 mg total) by mouth daily in the afternoon.  . Biotin w/ Vitamins C & E (HAIR/SKIN/NAILS PO) Take 1 tablet by mouth daily.  . celecoxib (CELEBREX) 100 MG capsule Take 1 capsule (100 mg total) by mouth daily.  Marland Kitchen CINNAMON PO Take 350 mg by mouth daily.   . Coenzyme Q10 (COQ10) 200 MG CAPS Take 200 mg by mouth daily.   Marland Kitchen gabapentin (NEURONTIN) 100 MG capsule Take 1 capsule (100 mg total) by mouth 3 (three) times daily. (Patient taking differently: Take 100 mg by mouth as needed. )  . Omega-3 Fatty Acids (OMEGA 3 PO) Take 2,000 mg by mouth daily.   Marland Kitchen OVER THE COUNTER MEDICATION Take 1 tablet by mouth daily. Circulation and Vein,- grapeseed, pine bark, horse chestnut, and butchers broom  . Propylene Glycol (SYSTANE BALANCE) 0.6 % SOLN Place 1 drop into both eyes daily as needed (for dry eyes).  Marland Kitchen Resveratrol 100 MG CAPS  Take 100 mg by mouth daily.  . TRAVATAN Z 0.004 % SOLN ophthalmic solution Place 1 drop into both eyes at bedtime.   . TURMERIC PO Take 200 mg by mouth daily.  . valsartan (DIOVAN) 320 MG tablet Take 1 tablet (320 mg total) by mouth daily.  . [DISCONTINUED] amLODipine (NORVASC) 5 MG tablet Take 1 tablet (5 mg total) by mouth daily in the afternoon.    Review of Systems  Constitution: Negative for weight gain.  Cardiovascular: Positive for palpitations. Negative for dyspnea on exertion, leg swelling and syncope.  Respiratory: Negative for hemoptysis.     Endocrine: Negative for cold intolerance.  Hematologic/Lymphatic: Does not bruise/bleed easily.  Musculoskeletal: Positive for back pain.  Gastrointestinal: Negative for hematochezia and melena.  Neurological: Negative for headaches and light-headedness.      Objective  Blood pressure (!) 161/77, pulse (!) 57, height '5\' 3"'  (1.6 m), weight 181 lb (82.1 kg). Body mass index is 32.06 kg/m.   Physical exam not performed or limited due to virtual visit.    Please see exam details from prior visit is as below.    Physical Exam  Constitutional:  Moderately built and mildly obese in no acute distress.  Eyes: Conjunctivae are normal.  Neck: No thyromegaly present.  Cardiovascular: Normal rate, regular rhythm, normal heart sounds and intact distal pulses. Frequent extrasystoles are present. Exam reveals no gallop.  No murmur heard. No JVD, No leg edema  Pulmonary/Chest: Effort normal and breath sounds normal.  Abdominal: Soft. Bowel sounds are normal.  Musculoskeletal:     Cervical back: Neck supple.  Skin: Skin is warm and dry.   Radiology: No results found.  Laboratory examination:   PCP labs 12/19/2018: Creatinine 0.8, potassium 4.0, eGFR 75/86, CMP normal. Cholesterol 183, triglycerides 43, HDL 59, LDL 115.   CMP Latest Ref Rng & Units 11/08/2018 10/13/2018 09/30/2018  Glucose 65 - 99 mg/dL 106(H) 133(H) 111(H)  BUN 8 - 27 mg/dL '14 11 10  ' Creatinine 0.57 - 1.00 mg/dL 0.84 0.80 0.79  Sodium 134 - 144 mmol/L 142 140 138  Potassium 3.5 - 5.2 mmol/L 4.5 3.4(L) 3.1(L)  Chloride 96 - 106 mmol/L 100 106 104  CO2 20 - 29 mmol/L '24 26 24  ' Calcium 8.7 - 10.3 mg/dL 10.6(H) 9.7 9.7  Total Protein 6.5 - 8.1 g/dL - 7.2 -  Total Bilirubin 0.3 - 1.2 mg/dL - 1.0 -  Alkaline Phos 38 - 126 U/L - 68 -  AST 15 - 41 U/L - 24 -  ALT 0 - 44 U/L - 19 -   CBC Latest Ref Rng & Units 10/13/2018 09/30/2018 11/20/2015  WBC 4.0 - 10.5 K/uL 9.4 9.8 7.2  Hemoglobin 12.0 - 15.0 g/dL 15.6(H) 15.8(H) 14.6   Hematocrit 36.0 - 46.0 % 46.1(H) 47.2(H) 42.2  Platelets 150 - 400 K/uL 230 252 214   Lipid Panel  No results found for: CHOL, TRIG, HDL, CHOLHDL, VLDL, LDLCALC, LDLDIRECT HEMOGLOBIN A1C No results found for: HGBA1C, MPG TSH No results for input(s): TSH in the last 8760 hours.  Cardiac Studies:   Event monitor 30 days 11/19/2018: Predominant rhythm is normal sinus rhythm.  There were 9 events reported revealing normal sinus rhythm.  Occasional PACs were noted.  No atrial fibrillation, no heart block.   Echocardiogram at PCP   10/16/2018: LV size is normal, mild LVH, EF 40-34%, grade 1 diastolic dysfunction with elevated LVEDP.  Mildly calcified aortic valve with mild aortic regurgitation and mild aortic valve stenosis.  Mild tricuspid regurgitation, no pulmonary hypertension.   Exercise myoview stress 11/10/2018:  1. Lexiscan stress test was performed. Exercise capacity was not assessed. No stress symptoms reported. Resting blood pressure was 150/82 mmHg and peak effect blood pressure was 142/68 mmHg. Stress EKG is non diagnostic for ischemia as it is a pharmacologic stress.  2. The overall quality of the study is good. There is no evidence of abnormal lung activity. Stress and rest SPECT images demonstrate homogeneous tracer distribution throughout the myocardium. Gated SPECT imaging reveals normal myocardial thickening and wall motion. The left ventricular ejection fraction was normal (59%).   3. Low risk study.    Assessment   Essential hypertension - Plan: amLODipine (NORVASC) 10 MG tablet, chlorthalidone (HYGROTON) 25 MG tablet, BASIC METABOLIC PANEL WITH GFR  EKG  10/25/2018: Sinus  Rhythm  At rate of 88 bpm,- occasional ectopic ventricular beat   Poor R-wave progression, cannot exclude -Old anterior infarct. I am very doubtful this is neurogenic syncope.    Recommendations:   [name]  is a 74 y.o.  female  with History of chronic back pain and lumbar laminectomy  in 2017, lumbar ago, degenerative joint disease, essential hypertension, mild hyperlipidemia, diabetes mellitus presents for follow-up of hypertension.  Fortunately she has not had any further palpitations, on her last office visit I had added amlodipine for uncontrolled hypertension, blood pressure still is elevated.  I will increase amlodipine to 10 mg daily.  We will add chlorthalidone 12.5 mg daily.  Obtain BMP in 2 weeks.  She complains of cough and thinks that it may be related to valsartan.  Advised her to hold valsartan for 3-4 days to see if cough is improved, and to inform me.  If no change, probably related to either allergies or GERD.  She should restart valsartan.  I'd like to see her back in 6-8 weeks for follow-up of hypertension and if stable, will see her back on a p.r.n. basis.  Adrian Prows, MD, [facility] 08/30/2019, 12:17 PM [facility] [place]

## 2019-09-18 ENCOUNTER — Other Ambulatory Visit: Payer: Self-pay | Admitting: Cardiology

## 2019-09-18 DIAGNOSIS — I10 Essential (primary) hypertension: Secondary | ICD-10-CM | POA: Diagnosis not present

## 2019-09-19 LAB — BASIC METABOLIC PANEL (7)
BUN/Creatinine Ratio: 13 (ref 12–28)
BUN: 11 mg/dL (ref 8–27)
CO2: 22 mmol/L (ref 20–29)
Chloride: 100 mmol/L (ref 96–106)
Creatinine, Ser: 0.87 mg/dL (ref 0.57–1.00)
GFR calc Af Amer: 78 mL/min/{1.73_m2} (ref >59)
GFR calc non Af Amer: 67 mL/min/{1.73_m2} (ref >59)
Glucose: 124 mg/dL — ABNORMAL HIGH (ref 65–99)
Sodium: 140 mmol/L (ref 134–144)

## 2019-09-27 ENCOUNTER — Encounter: Payer: Self-pay | Admitting: Cardiology

## 2019-09-27 ENCOUNTER — Other Ambulatory Visit: Payer: Self-pay | Admitting: Cardiology

## 2019-09-27 NOTE — Telephone Encounter (Signed)
From patient.

## 2019-09-27 NOTE — Telephone Encounter (Signed)
Sure, we can keep an eye on this and see how you do.  However since the dose was fairly high, your BP will rebound to high again soon, can we try spironolactone 50 mg daily and check kidney function in 2 weeks.    I have discontinued valsartan in your chart and put allergy alert to it.

## 2019-12-06 ENCOUNTER — Other Ambulatory Visit: Payer: Self-pay | Admitting: Cardiology

## 2019-12-06 DIAGNOSIS — I10 Essential (primary) hypertension: Secondary | ICD-10-CM

## 2019-12-17 DIAGNOSIS — H401131 Primary open-angle glaucoma, bilateral, mild stage: Secondary | ICD-10-CM | POA: Diagnosis not present

## 2020-01-09 ENCOUNTER — Other Ambulatory Visit: Payer: Self-pay | Admitting: Cardiology

## 2020-01-09 DIAGNOSIS — I10 Essential (primary) hypertension: Secondary | ICD-10-CM

## 2020-02-27 ENCOUNTER — Encounter: Payer: Self-pay | Admitting: Cardiology

## 2020-02-27 ENCOUNTER — Other Ambulatory Visit: Payer: Self-pay

## 2020-02-27 ENCOUNTER — Ambulatory Visit: Payer: Medicare Other | Admitting: Cardiology

## 2020-02-27 VITALS — BP 127/74 | HR 77 | Resp 15 | Ht 63.0 in | Wt 177.8 lb

## 2020-02-27 DIAGNOSIS — I10 Essential (primary) hypertension: Secondary | ICD-10-CM

## 2020-02-27 DIAGNOSIS — R002 Palpitations: Secondary | ICD-10-CM

## 2020-02-27 DIAGNOSIS — I493 Ventricular premature depolarization: Secondary | ICD-10-CM

## 2020-02-27 NOTE — Progress Notes (Signed)
Primary Physician/Referring:  Monico Blitz, MD  Patient ID: [name], female    DOB: 1946-05-28, 74 y.o.   MRN: [id number]  Chief Complaint  Patient presents with  . Follow-up    6 month  . Hypertension   HPI: [name]  is a 74 y.o. female  with History of chronic back pain and lumbar laminectomy in 2017, lumbar ago, degenerative joint disease, essential hypertension, mild hyperlipidemia presents for follow-up of hypertension and palpitations.  Previously she was evaluated for syncope that occurred on 10/13/2018, 2nd episode, she woke up in the morning with left shoulder pain and went to the bathroom and saw there was blood on the floor and in her mouth, she has no recollection.  1st episode occurred on  09/30/2018 when she presented to the emergency room with lightheadedness and syncope while standing in the kitchen and was noted to have occasional PACs.   She has not had any further recurrence.   Except for chronic mild back pain, doing well with occasional palpitations lasting a second or two.   Past Medical History:  Diagnosis Date  . Anxiety   . Aortic stenosis   . Arthritis   . Depression   . Essential hypertension 10/25/2018  . Glaucoma   . Gout   . Hyperlipidemia   . Hypertension   . Syncope and collapse 10/25/2018    Past Surgical History:  Procedure Laterality Date  . APPENDECTOMY    . JOINT REPLACEMENT     both knees 2009 and shoulder reconstruction 2006  . KNEE ARTHROPLASTY Right   . KNEE ARTHROPLASTY Left   . LUMBAR LAMINECTOMY/DECOMPRESSION MICRODISCECTOMY N/A 11/28/2015   Procedure: LUMBAR LAMINECTOMY/ DECOMPRESSION MICRODISCECTOMY LUMBAR TWO-THREE, LUMBAR THREE-FOUR  ;  Surgeon: Ashok Pall, MD;  Location: [facility];  Service: Neurosurgery;  Laterality: N/A;  . PARTIAL HYSTERECTOMY    . SHOULDER SURGERY     reconstruction  . TONSILECTOMY, ADENOIDECTOMY, BILATERAL MYRINGOTOMY AND TUBES     Social History   Tobacco Use  . Smoking  status: Never Smoker  . Smokeless tobacco: Never Used  Substance Use Topics  . Alcohol use: No   Marital Status: Widowed   Current Meds  Medication Sig  . amLODipine (NORVASC) 10 MG tablet TAKE 1 TABLET EVERY AFTERNOON.  Marland Kitchen ASTAXANTHIN PO Take 1 tablet by mouth daily.  Marland Kitchen buPROPion (WELLBUTRIN XL) 300 MG 24 hr tablet Take 300 mg by mouth daily.  . chlorthalidone (HYGROTON) 25 MG tablet Take 0.5 tablets (12.5 mg total) by mouth every morning.  Marland Kitchen CINNAMON PO Take 350 mg by mouth daily.   . Coenzyme Q10 (COQ10) 200 MG CAPS Take 200 mg by mouth daily.   Marland Kitchen gabapentin (NEURONTIN) 100 MG capsule Take 1 capsule (100 mg total) by mouth 3 (three) times daily. (Patient taking differently: Take 100 mg by mouth as needed. )  . HYDROcodone-acetaminophen (NORCO/VICODIN) 5-325 MG tablet Take 1 tablet by mouth every 6 (six) hours as needed for moderate pain. (Patient taking differently: Take 1 tablet by mouth as needed for moderate pain. )  . LORazepam (ATIVAN) 0.5 MG tablet Take 0.5 mg by mouth 2 (two) times daily as needed for anxiety.  . Omega-3 Fatty Acids (OMEGA 3 PO) Take 2,000 mg by mouth daily.   Marland Kitchen OVER THE COUNTER MEDICATION Take 1 tablet by mouth daily. Circulation and Vein,- grapeseed, pine bark, horse chestnut, and butchers broom  . Propylene Glycol (SYSTANE BALANCE) 0.6 % SOLN Place 1 drop into both  eyes daily as needed (for dry eyes).  Marland Kitchen Resveratrol 100 MG CAPS Take 100 mg by mouth daily.  . TRAVATAN Z 0.004 % SOLN ophthalmic solution Place 1 drop into both eyes at bedtime.   . TURMERIC PO Take 200 mg by mouth daily.    Review of Systems  Cardiovascular: Positive for irregular heartbeat. Negative for chest pain, dyspnea on exertion and leg swelling.  Musculoskeletal: Positive for arthritis and back pain.  Gastrointestinal: Negative for melena.   Objective  Blood pressure 127/74, pulse 77, resp. rate 15, height _0  (1.6 m), weight 177 lb 12.8 oz (80.6 kg), SpO2 98 %. Body mass index is 31.5  kg/m.  Vitals with BMI 02/27/2020 08/30/2019 08/30/2019  Height _1  - _2   Weight 177 lbs 13 oz - 181 lbs  BMI 79.3 - 90.30  Systolic 092 330 076  Diastolic 74 77 72  Pulse 77 57 60       Physical Exam Constitutional:      Comments: Moderately built and mildly obese in no acute distress.  Eyes:     Conjunctiva/sclera: Conjunctivae normal.  Neck:     Thyroid: No thyromegaly.  Cardiovascular:     Rate and Rhythm: Normal rate and regular rhythm. Frequent extrasystoles are present.    Pulses: Intact distal pulses.     Heart sounds: Normal heart sounds. No murmur heard.  No gallop.      Comments: No JVD, No leg edema Pulmonary:     Effort: Pulmonary effort is normal.     Breath sounds: Normal breath sounds.  Abdominal:     General: Bowel sounds are normal.     Palpations: Abdomen is soft.  Musculoskeletal:     Cervical back: Neck supple.  Skin:    General: Skin is warm and dry.    Radiology: No results found.  Laboratory examination:   PCP labs 12/19/2018: Creatinine 0.8, potassium 4.0, eGFR 75/86, CMP normal. Cholesterol 183, triglycerides 43, HDL 59, LDL 115.   CMP Latest Ref Rng & Units 09/18/2019 11/08/2018 10/13/2018  Glucose 65 - 99 mg/dL 124(H) 106(H) 133(H)  BUN 8 - 27 mg/dL _3 Creatinine 0.57 - 1.00 mg/dL 0.87 0.84 0.80  Sodium 134 - 144 mmol/L 140 142 140  Potassium mmol/L CANCELED 4.5 3.4(L)  Chloride 96 - 106 mmol/L 100 100 106  CO2 20 - 29 mmol/L _4 Calcium 8.7 - 10.3 mg/dL - 10.6(H) 9.7  Total Protein 6.5 - 8.1 g/dL - - 7.2  Total Bilirubin 0.3 - 1.2 mg/dL - - 1.0  Alkaline Phos 38 - 126 U/L - - 68  AST 15 - 41 U/L - - 24  ALT 0 - 44 U/L - - 19   CBC Latest Ref Rng & Units 10/13/2018 09/30/2018 11/20/2015  WBC 4.0 - 10.5 K/uL 9.4 9.8 7.2  Hemoglobin 12.0 - 15.0 g/dL 15.6(H) 15.8(H) 14.6  Hematocrit 36 - 46 % 46.1(H) 47.2(H) 42.2  Platelets 150 - 400 K/uL 230 252 214   External labs:  Cholesterol, total 196.000 M 04/26/2019 HDL 57.000 M  04/26/2019 LDL-C 115.000 M 12/19/2018 Triglycerides 60.000 M 04/26/2019  Hemoglobin 15.900 G/ 04/26/2019 Platelets 225.000 X 04/26/2019  Creatinine, Serum 0.870 09/18/2019 Potassium 4.500 11/08/2018 Magnesium N/D ALT (SGPT) 16.000 IU/ 04/26/2019  TSH 1.640 04/26/2019  Cardiac Studies:   Event monitor 30 days October 31, 2018: Predominant rhythm is normal sinus rhythm.  There were 9 events reported revealing normal sinus rhythm.  Occasional PACs were noted.  No atrial fibrillation, no heart block.   Echocardiogram at PCP   10/16/2018: LV size is normal, mild LVH, EF 67-54%, grade 1 diastolic dysfunction with elevated LVEDP.  Mildly calcified aortic valve with mild aortic regurgitation and mild aortic valve stenosis.  Mild tricuspid regurgitation, no pulmonary hypertension.   Exercise myoview stress 11/10/2018:  1. Lexiscan stress test was performed. Exercise capacity was not assessed. No stress symptoms reported. Resting blood pressure was 150/82 mmHg and peak effect blood pressure was 142/68 mmHg. Stress EKG is non diagnostic for ischemia as it is a pharmacologic stress.  2. The overall quality of the study is good. There is no evidence of abnormal lung activity. Stress and rest SPECT images demonstrate homogeneous tracer distribution throughout the myocardium. Gated SPECT imaging reveals normal myocardial thickening and wall motion. The left ventricular ejection fraction was normal (59%).   3. Low risk study.   EKG    EKG 02/27/2020: Normal sinus rhythm with rate of 89 bpm, left atrial abnormality, normal axis.  Poor R wave progression, cannot exclude anteroseptal infarct old.  No evidence of ischemia.  No significant change from 10/25/2018.   Assessment   Essential hypertension - Plan: EKG 12-Lead  Palpitations  PVC (premature ventricular contraction)   Recommendations:   [name]  is a 74 y.o.  female  with History of chronic back pain and lumbar laminectomy in 2017, lumbar ago,  degenerative joint disease, essential hypertension, mild hyperlipidemia, presents for f/u of hypertension and palpitations.   Symptoms of palpitations are very few and suggest PVCs.  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  Otherwise remains asymptomatic, with combination of amlodipine and chlorthalidone her blood pressure is also very well controlled.  Advised her to follow-up with her PCP for further management and evaluation and I will see her back on a as needed basis.  She can also obtain the prescriptions from Dr. Manuella Ghazi.   Adrian Prows, MD, [facility] 02/27/2020, 2:45 PM [facility]

## 2020-02-28 NOTE — Telephone Encounter (Signed)
From patient.

## 2020-03-13 ENCOUNTER — Other Ambulatory Visit: Payer: Self-pay | Admitting: Cardiology

## 2020-03-13 DIAGNOSIS — I10 Essential (primary) hypertension: Secondary | ICD-10-CM

## 2020-03-14 DIAGNOSIS — Z23 Encounter for immunization: Secondary | ICD-10-CM | POA: Diagnosis not present

## 2020-03-19 ENCOUNTER — Other Ambulatory Visit: Payer: Self-pay | Admitting: Orthopedic Surgery

## 2020-03-19 DIAGNOSIS — M5430 Sciatica, unspecified side: Secondary | ICD-10-CM

## 2020-03-19 NOTE — Telephone Encounter (Signed)
Fax request received from [facility] for refill: gabapentin (NEURONTIN) 100 MG capsule 90 capsule

## 2020-03-20 MED ORDER — GABAPENTIN 100 MG PO CAPS: 100.0000 mg | ORAL_CAPSULE | Freq: Three times a day (TID) | ORAL | 2 refills | Status: DC

## 2020-04-15 ENCOUNTER — Other Ambulatory Visit: Payer: Self-pay | Admitting: Cardiology

## 2020-04-15 DIAGNOSIS — I10 Essential (primary) hypertension: Secondary | ICD-10-CM

## 2020-05-29 DIAGNOSIS — Z6831 Body mass index (BMI) 31.0-31.9, adult: Secondary | ICD-10-CM | POA: Diagnosis not present

## 2020-05-29 DIAGNOSIS — Z299 Encounter for prophylactic measures, unspecified: Secondary | ICD-10-CM | POA: Diagnosis not present

## 2020-05-29 DIAGNOSIS — I10 Essential (primary) hypertension: Secondary | ICD-10-CM | POA: Diagnosis not present

## 2020-05-29 DIAGNOSIS — M542 Cervicalgia: Secondary | ICD-10-CM | POA: Diagnosis not present

## 2020-05-29 DIAGNOSIS — Z713 Dietary counseling and surveillance: Secondary | ICD-10-CM | POA: Diagnosis not present

## 2020-06-02 DIAGNOSIS — M9901 Segmental and somatic dysfunction of cervical region: Secondary | ICD-10-CM | POA: Diagnosis not present

## 2020-06-02 DIAGNOSIS — M546 Pain in thoracic spine: Secondary | ICD-10-CM | POA: Diagnosis not present

## 2020-06-02 DIAGNOSIS — M9902 Segmental and somatic dysfunction of thoracic region: Secondary | ICD-10-CM | POA: Diagnosis not present

## 2020-06-02 DIAGNOSIS — M542 Cervicalgia: Secondary | ICD-10-CM | POA: Diagnosis not present

## 2020-06-04 DIAGNOSIS — M542 Cervicalgia: Secondary | ICD-10-CM | POA: Diagnosis not present

## 2020-06-04 DIAGNOSIS — M9902 Segmental and somatic dysfunction of thoracic region: Secondary | ICD-10-CM | POA: Diagnosis not present

## 2020-06-04 DIAGNOSIS — M9901 Segmental and somatic dysfunction of cervical region: Secondary | ICD-10-CM | POA: Diagnosis not present

## 2020-06-04 DIAGNOSIS — M546 Pain in thoracic spine: Secondary | ICD-10-CM | POA: Diagnosis not present

## 2020-06-06 DIAGNOSIS — M542 Cervicalgia: Secondary | ICD-10-CM | POA: Diagnosis not present

## 2020-06-06 DIAGNOSIS — M9902 Segmental and somatic dysfunction of thoracic region: Secondary | ICD-10-CM | POA: Diagnosis not present

## 2020-06-06 DIAGNOSIS — M546 Pain in thoracic spine: Secondary | ICD-10-CM | POA: Diagnosis not present

## 2020-06-06 DIAGNOSIS — M9901 Segmental and somatic dysfunction of cervical region: Secondary | ICD-10-CM | POA: Diagnosis not present

## 2020-06-09 DIAGNOSIS — M9902 Segmental and somatic dysfunction of thoracic region: Secondary | ICD-10-CM | POA: Diagnosis not present

## 2020-06-09 DIAGNOSIS — M542 Cervicalgia: Secondary | ICD-10-CM | POA: Diagnosis not present

## 2020-06-09 DIAGNOSIS — M546 Pain in thoracic spine: Secondary | ICD-10-CM | POA: Diagnosis not present

## 2020-06-09 DIAGNOSIS — M9901 Segmental and somatic dysfunction of cervical region: Secondary | ICD-10-CM | POA: Diagnosis not present

## 2020-06-11 DIAGNOSIS — M542 Cervicalgia: Secondary | ICD-10-CM | POA: Diagnosis not present

## 2020-06-11 DIAGNOSIS — M9901 Segmental and somatic dysfunction of cervical region: Secondary | ICD-10-CM | POA: Diagnosis not present

## 2020-06-11 DIAGNOSIS — M546 Pain in thoracic spine: Secondary | ICD-10-CM | POA: Diagnosis not present

## 2020-06-11 DIAGNOSIS — M9902 Segmental and somatic dysfunction of thoracic region: Secondary | ICD-10-CM | POA: Diagnosis not present

## 2020-06-12 DIAGNOSIS — Z6832 Body mass index (BMI) 32.0-32.9, adult: Secondary | ICD-10-CM | POA: Diagnosis not present

## 2020-06-12 DIAGNOSIS — E7800 Pure hypercholesterolemia, unspecified: Secondary | ICD-10-CM | POA: Diagnosis not present

## 2020-06-12 DIAGNOSIS — Z79899 Other long term (current) drug therapy: Secondary | ICD-10-CM | POA: Diagnosis not present

## 2020-06-12 DIAGNOSIS — Z1339 Encounter for screening examination for other mental health and behavioral disorders: Secondary | ICD-10-CM | POA: Diagnosis not present

## 2020-06-12 DIAGNOSIS — I10 Essential (primary) hypertension: Secondary | ICD-10-CM | POA: Diagnosis not present

## 2020-06-12 DIAGNOSIS — Z0000 Encounter for general adult medical examination without abnormal findings: Secondary | ICD-10-CM | POA: Diagnosis not present

## 2020-06-12 DIAGNOSIS — Z1331 Encounter for screening for depression: Secondary | ICD-10-CM | POA: Diagnosis not present

## 2020-06-12 DIAGNOSIS — R5383 Other fatigue: Secondary | ICD-10-CM | POA: Diagnosis not present

## 2020-06-12 DIAGNOSIS — Z299 Encounter for prophylactic measures, unspecified: Secondary | ICD-10-CM | POA: Diagnosis not present

## 2020-06-12 DIAGNOSIS — Z7189 Other specified counseling: Secondary | ICD-10-CM | POA: Diagnosis not present

## 2020-06-17 DIAGNOSIS — H401130 Primary open-angle glaucoma, bilateral, stage unspecified: Secondary | ICD-10-CM | POA: Diagnosis not present

## 2020-06-17 DIAGNOSIS — H401131 Primary open-angle glaucoma, bilateral, mild stage: Secondary | ICD-10-CM | POA: Diagnosis not present

## 2020-06-20 DIAGNOSIS — M9901 Segmental and somatic dysfunction of cervical region: Secondary | ICD-10-CM | POA: Diagnosis not present

## 2020-06-20 DIAGNOSIS — M542 Cervicalgia: Secondary | ICD-10-CM | POA: Diagnosis not present

## 2020-06-20 DIAGNOSIS — M9902 Segmental and somatic dysfunction of thoracic region: Secondary | ICD-10-CM | POA: Diagnosis not present

## 2020-06-20 DIAGNOSIS — M546 Pain in thoracic spine: Secondary | ICD-10-CM | POA: Diagnosis not present

## 2020-06-24 DIAGNOSIS — D72829 Elevated white blood cell count, unspecified: Secondary | ICD-10-CM | POA: Diagnosis not present

## 2020-06-24 DIAGNOSIS — F32A Depression, unspecified: Secondary | ICD-10-CM | POA: Diagnosis not present

## 2020-06-24 DIAGNOSIS — I10 Essential (primary) hypertension: Secondary | ICD-10-CM | POA: Diagnosis not present

## 2020-06-24 DIAGNOSIS — M79672 Pain in left foot: Secondary | ICD-10-CM | POA: Diagnosis not present

## 2020-06-24 DIAGNOSIS — Z299 Encounter for prophylactic measures, unspecified: Secondary | ICD-10-CM | POA: Diagnosis not present

## 2020-07-04 DIAGNOSIS — D72829 Elevated white blood cell count, unspecified: Secondary | ICD-10-CM | POA: Diagnosis not present

## 2020-07-11 DIAGNOSIS — I10 Essential (primary) hypertension: Secondary | ICD-10-CM | POA: Diagnosis not present

## 2020-07-11 DIAGNOSIS — M109 Gout, unspecified: Secondary | ICD-10-CM | POA: Diagnosis not present

## 2020-07-11 DIAGNOSIS — Z713 Dietary counseling and surveillance: Secondary | ICD-10-CM | POA: Diagnosis not present

## 2020-07-11 DIAGNOSIS — Z6832 Body mass index (BMI) 32.0-32.9, adult: Secondary | ICD-10-CM | POA: Diagnosis not present

## 2020-07-11 DIAGNOSIS — Z299 Encounter for prophylactic measures, unspecified: Secondary | ICD-10-CM | POA: Diagnosis not present

## 2020-07-11 DIAGNOSIS — Z2821 Immunization not carried out because of patient refusal: Secondary | ICD-10-CM | POA: Diagnosis not present

## 2020-07-13 ENCOUNTER — Other Ambulatory Visit: Payer: Self-pay | Admitting: Cardiology

## 2020-07-13 DIAGNOSIS — I10 Essential (primary) hypertension: Secondary | ICD-10-CM

## 2020-07-14 ENCOUNTER — Other Ambulatory Visit: Payer: Self-pay | Admitting: Cardiology

## 2020-07-14 DIAGNOSIS — Z299 Encounter for prophylactic measures, unspecified: Secondary | ICD-10-CM | POA: Diagnosis not present

## 2020-07-14 DIAGNOSIS — I350 Nonrheumatic aortic (valve) stenosis: Secondary | ICD-10-CM | POA: Diagnosis not present

## 2020-07-14 DIAGNOSIS — I10 Essential (primary) hypertension: Secondary | ICD-10-CM

## 2020-07-14 DIAGNOSIS — Z6831 Body mass index (BMI) 31.0-31.9, adult: Secondary | ICD-10-CM | POA: Diagnosis not present

## 2020-07-14 DIAGNOSIS — Z23 Encounter for immunization: Secondary | ICD-10-CM | POA: Diagnosis not present

## 2020-08-05 DIAGNOSIS — M109 Gout, unspecified: Secondary | ICD-10-CM | POA: Diagnosis not present

## 2020-08-18 ENCOUNTER — Other Ambulatory Visit: Payer: Self-pay | Admitting: Cardiology

## 2020-08-18 DIAGNOSIS — I10 Essential (primary) hypertension: Secondary | ICD-10-CM

## 2020-09-09 DIAGNOSIS — Z6831 Body mass index (BMI) 31.0-31.9, adult: Secondary | ICD-10-CM | POA: Diagnosis not present

## 2020-09-09 DIAGNOSIS — I10 Essential (primary) hypertension: Secondary | ICD-10-CM | POA: Diagnosis not present

## 2020-09-09 DIAGNOSIS — Z299 Encounter for prophylactic measures, unspecified: Secondary | ICD-10-CM | POA: Diagnosis not present

## 2020-09-09 DIAGNOSIS — Z20822 Contact with and (suspected) exposure to covid-19: Secondary | ICD-10-CM | POA: Diagnosis not present

## 2020-09-09 DIAGNOSIS — Z789 Other specified health status: Secondary | ICD-10-CM | POA: Diagnosis not present

## 2020-09-18 ENCOUNTER — Other Ambulatory Visit: Payer: Self-pay | Admitting: Cardiology

## 2020-09-18 DIAGNOSIS — I10 Essential (primary) hypertension: Secondary | ICD-10-CM

## 2020-09-23 ENCOUNTER — Other Ambulatory Visit: Payer: Self-pay

## 2020-09-23 DIAGNOSIS — I10 Essential (primary) hypertension: Secondary | ICD-10-CM

## 2020-09-23 MED ORDER — CHLORTHALIDONE 25 MG PO TABS: 12.5000 mg | ORAL_TABLET | Freq: Every morning | ORAL | 0 refills | Status: DC

## 2020-10-29 DIAGNOSIS — Z8739 Personal history of other diseases of the musculoskeletal system and connective tissue: Secondary | ICD-10-CM | POA: Diagnosis not present

## 2020-10-29 DIAGNOSIS — H409 Unspecified glaucoma: Secondary | ICD-10-CM | POA: Diagnosis not present

## 2020-10-29 DIAGNOSIS — I10 Essential (primary) hypertension: Secondary | ICD-10-CM | POA: Diagnosis not present

## 2020-10-29 DIAGNOSIS — I4430 Unspecified atrioventricular block: Secondary | ICD-10-CM | POA: Diagnosis not present

## 2020-10-29 DIAGNOSIS — Z6831 Body mass index (BMI) 31.0-31.9, adult: Secondary | ICD-10-CM | POA: Diagnosis not present

## 2020-10-30 DIAGNOSIS — Z23 Encounter for immunization: Secondary | ICD-10-CM | POA: Diagnosis not present

## 2020-11-03 ENCOUNTER — Telehealth: Payer: Self-pay

## 2020-11-03 NOTE — Telephone Encounter (Signed)
If she is feeling well now she can schedule an appointment, but it is not urgent. I am happy to see her at some point this week if she would like, but does not need to be today.

## 2020-11-03 NOTE — Telephone Encounter (Signed)
Patient stated that she has had a cough for about 3 weeks, but is now gone. During that time, her grandson had Covid - Omicron. But since he did not have the cough, she thought it may be a sign of CHF because her husband died from CHF. She recently felt a flutter, about 2 weeks ago, that lasted about 2 days. Then, she felt chest burning from indigestion, which lasted about 3 days. She feels fine now, BP this morning 127/69 and HR 71. She is only concerned because of what she knew about her husband. Her question is, should she be seen immediately or is she ok to wait to schedule an appointment? Please advise.

## 2020-11-03 NOTE — Telephone Encounter (Signed)
Patient will call back to schedule appointment for flutters. She will be seeing Celeste.

## 2020-11-06 NOTE — Progress Notes (Signed)
Primary Physician/Referring:  Wanita Chamberlain, PA-C  Patient ID: [name], female    DOB: 1946/06/18, 75 y.o.   MRN: [id number]  Chief Complaint  Patient presents with  . Atrial Flutter  . Cough   HPI: [name]  is a 75 y.o. female  with History of chronic back pain and lumbar laminectomy in 2017, lumbar ago, degenerative joint disease, essential hypertension, mild hyperlipidemia, history of PVCs/PACs.  Previously she was evaluated for syncope that occurred on 10/13/2018, 2nd episode, she woke up in the morning with left shoulder pain and went to the bathroom and saw there was blood on the floor and in her mouth, she has no recollection.  1st episode occurred on  09/30/2018 when she presented to the emergency room with lightheadedness and syncope while standing in the kitchen and was noted to have occasional PACs.   She has not had any further recurrence.   Patient presents for follow-up visit at her request with concerns of "fluttering in her chest" and cough.  Patient reports several weeks ago she experienced 3 weeks of intermittent coughing which concerned her that it may be related to congestive heart failure, as her husband died with heart failure.  She also is concerned as she has been having palpitations more frequently, averaging several times per week.  Palpitations are intermittent and last only a few seconds.  Denies syncope, near syncope, dizziness, chest pain, dyspnea.  Patient reports no significant caffeine or sugar intake.  She monitors her blood pressure readings at home and reports average 130/82 mmHg.   Past Medical History:  Diagnosis Date  . Anxiety   . Aortic stenosis   . Arthritis   . Depression   . Essential hypertension 10/25/2018  . Glaucoma   . Gout   . Hyperlipidemia   . Hypertension   . Syncope and collapse 10/25/2018   Family History  Problem Relation Age of Onset  . Colon cancer Cousin   . Heart attack Mother   . Cancer Father    . Diabetes Brother    Past Surgical History:  Procedure Laterality Date  . APPENDECTOMY    . JOINT REPLACEMENT     both knees 2009 and shoulder reconstruction 2006  . KNEE ARTHROPLASTY Right   . KNEE ARTHROPLASTY Left   . LUMBAR LAMINECTOMY/DECOMPRESSION MICRODISCECTOMY N/A 11/28/2015   Procedure: LUMBAR LAMINECTOMY/ DECOMPRESSION MICRODISCECTOMY LUMBAR TWO-THREE, LUMBAR THREE-FOUR  ;  Surgeon: Ashok Pall, MD;  Location: [facility];  Service: Neurosurgery;  Laterality: N/A;  . PARTIAL HYSTERECTOMY    . SHOULDER SURGERY     reconstruction  . TONSILECTOMY, ADENOIDECTOMY, BILATERAL MYRINGOTOMY AND TUBES     Social History   Tobacco Use  . Smoking status: Never Smoker  . Smokeless tobacco: Never Used  Substance Use Topics  . Alcohol use: No   Marital Status: Widowed    ROS   Review of Systems  Constitutional: Negative for malaise/fatigue and weight gain.  Cardiovascular: Positive for irregular heartbeat. Negative for chest pain, claudication, dyspnea on exertion, leg swelling, near-syncope, orthopnea, palpitations, paroxysmal nocturnal dyspnea and syncope.  Respiratory: Negative for shortness of breath.   Hematologic/Lymphatic: Does not bruise/bleed easily.  Musculoskeletal: Positive for arthritis and back pain.  Gastrointestinal: Negative for melena.  Neurological: Negative for dizziness and weakness.   Objective  Blood pressure (!) 147/83, pulse 99, temperature 98.1 F (36.7 C), temperature source Temporal, resp. rate 17, height '5\' 3"'  (1.6 m), weight 173 lb 12.8 oz (78.8 kg),  SpO2 99 %. Body mass index is 30.79 kg/m.  Vitals with BMI 11/10/2020 02/27/2020 08/30/2019  Height '5\' 3"'  '5\' 3"'  -  Weight 173 lbs 13 oz 177 lbs 13 oz -  BMI 23.30 07.6 -  Systolic 226 333 545  Diastolic 83 74 77  Pulse 99 77 57       Physical Exam Constitutional:      Appearance: She is obese.     Comments: Moderately built and mildly obese in no acute distress.  Eyes:     Conjunctiva/sclera:  Conjunctivae normal.  Neck:     Thyroid: No thyromegaly.  Cardiovascular:     Rate and Rhythm: Normal rate and regular rhythm. Frequent extrasystoles are present.    Pulses: Intact distal pulses.     Heart sounds: Normal heart sounds. No murmur heard. No gallop.      Comments: No JVD, No leg edema Pulmonary:     Effort: Pulmonary effort is normal.     Breath sounds: Normal breath sounds.  Abdominal:     General: Bowel sounds are normal.     Palpations: Abdomen is soft.  Musculoskeletal:     Cervical back: Neck supple.     Right lower leg: No edema.     Left lower leg: No edema.  Skin:    General: Skin is warm and dry.  Neurological:     General: No focal deficit present.     Mental Status: She is alert and oriented to person, place, and time.     Allergies and Medications    Allergies  Allergen Reactions  . Sulfonamide Derivatives Itching and Swelling    Redness  . Valsartan Cough   Current Outpatient Medications  Medication Instructions  . amLODipine (NORVASC) 10 MG tablet TAKE 1 TABLET EVERY AFTERNOON.  Marland Kitchen ASTAXANTHIN PO 1 tablet, Oral, Daily  . buPROPion (WELLBUTRIN XL) 300 mg, Oral, Daily  . chlorthalidone (HYGROTON) 12.5 mg, Oral, Every morning  . CoQ10 200 mg, Oral, Daily  . gabapentin (NEURONTIN) 100 mg, Oral, 3 times daily  . HYDROcodone-acetaminophen (NORCO/VICODIN) 5-325 MG tablet 1 tablet, Oral, Every 6 hours PRN  . LORazepam (ATIVAN) 0.5 mg, Oral, 2 times daily PRN  . metoprolol tartrate (LOPRESSOR) 12.5 mg, Oral, 2 times daily  . Omega-3 Fatty Acids (OMEGA 3 PO) 2,000 mg, Oral, Daily  . OVER THE COUNTER MEDICATION 1 tablet, Oral, Daily, Circulation and Vein,- grapeseed, pine bark, horse chestnut, and butchers broom  . Propylene Glycol 0.6 % SOLN 1 drop, Both Eyes, Daily PRN  . Resveratrol 100 mg, Oral, Daily  . TRAVATAN Z 0.004 % SOLN ophthalmic solution 1 drop, Both Eyes, Daily at bedtime  . TURMERIC PO 200 mg, Oral, Daily    Laboratory examination:    PCP labs 12/19/2018: Creatinine 0.8, potassium 4.0, eGFR 75/86, CMP normal. Cholesterol 183, triglycerides 43, HDL 59, LDL 115.   CMP Latest Ref Rng & Units 09/18/2019 11/08/2018 10/13/2018  Glucose 65 - 99 mg/dL 124(H) 106(H) 133(H)  BUN 8 - 27 mg/dL '11 14 11  ' Creatinine 0.57 - 1.00 mg/dL 0.87 0.84 0.80  Sodium 134 - 144 mmol/L 140 142 140  Potassium mmol/L CANCELED 4.5 3.4(L)  Chloride 96 - 106 mmol/L 100 100 106  CO2 20 - 29 mmol/L '22 24 26  ' Calcium 8.7 - 10.3 mg/dL - 10.6(H) 9.7  Total Protein 6.5 - 8.1 g/dL - - 7.2  Total Bilirubin 0.3 - 1.2 mg/dL - - 1.0  Alkaline Phos 38 - 126 U/L - -  68  AST 15 - 41 U/L - - 24  ALT 0 - 44 U/L - - 19   CBC Latest Ref Rng & Units 10/13/2018 09/30/2018 11/20/2015  WBC 4.0 - 10.5 K/uL 9.4 9.8 7.2  Hemoglobin 12.0 - 15.0 g/dL 15.6(H) 15.8(H) 14.6  Hematocrit 36.0 - 46.0 % 46.1(H) 47.2(H) 42.2  Platelets 150 - 400 K/uL 230 252 214   External labs:  Cholesterol, total 196.000 M 04/26/2019 HDL 57.000 M 04/26/2019 LDL-C 115.000 M 12/19/2018 Triglycerides 60.000 M 04/26/2019  Hemoglobin 15.900 G/ 04/26/2019 Platelets 225.000 X 04/26/2019  Creatinine, Serum 0.870 09/18/2019 Potassium 4.500 11/08/2018 Magnesium N/D ALT (SGPT) 16.000 IU/ 04/26/2019  TSH 1.640 04/26/2019   Radiology    No results found.  Cardiac Studies:   Event monitor 30 days November 02, 2018: Predominant rhythm is normal sinus rhythm.  There were 9 events reported revealing normal sinus rhythm.  Occasional PACs were noted.  No atrial fibrillation, no heart block.   Echocardiogram at PCP   10/16/2018: LV size is normal, mild LVH, EF 16-10%, grade 1 diastolic dysfunction with elevated LVEDP.  Mildly calcified aortic valve with mild aortic regurgitation and mild aortic valve stenosis.  Mild tricuspid regurgitation, no pulmonary hypertension.   Exercise myoview stress 11/10/2018:  1. Lexiscan stress test was performed. Exercise capacity was not assessed. No stress symptoms reported. Resting blood  pressure was 150/82 mmHg and peak effect blood pressure was 142/68 mmHg. Stress EKG is non diagnostic for ischemia as it is a pharmacologic stress.  2. The overall quality of the study is good. There is no evidence of abnormal lung activity. Stress and rest SPECT images demonstrate homogeneous tracer distribution throughout the myocardium. Gated SPECT imaging reveals normal myocardial thickening and wall motion. The left ventricular ejection fraction was normal (59%).   3. Low risk study.    EKG    EKG 11/10/2020: Sinus rhythm at a rate of 84 bpm.  Normal axis.  Poor R wave progression, cannot exclude anteroseptal infarct old.  Low voltage complexes, consider pulmonary disease pattern.  Nonspecific T wave abnormality.  Compared to EKG 02/27/2020, no significant change.  EKG 02/27/2020: Normal sinus rhythm with rate of 89 bpm, left atrial abnormality, normal axis.  Poor R wave progression, cannot exclude anteroseptal infarct old.  No evidence of ischemia.  No significant change from November 02, 2018.   Assessment   Palpitations - Plan: Basic metabolic panel, TSH, CBC  PVC (premature ventricular contraction)  Essential hypertension - Plan: EKG 12-Lead  Shortness of breath - Plan: Brain natriuretic peptide  Mixed hyperlipidemia - Plan: Lipid Panel With LDL/HDL Ratio  Meds ordered this encounter  Medications  . metoprolol tartrate (LOPRESSOR) 25 MG tablet    Sig: Take 0.5 tablets (12.5 mg total) by mouth 2 (two) times daily.    Dispense:  30 tablet    Refill:  3   Medications Discontinued During This Encounter  Medication Reason  . CINNAMON PO Error    Recommendations:   KATRIEL CUTSFORTH  is a 75 y.o.  female  with History of chronic back pain and lumbar laminectomy in 11-02-15, lumbar ago, degenerative joint disease, essential hypertension, mild hyperlipidemia, presents for f/u of hypertension and palpitations.   Patient presents for follow-up visit at her request with concerns of  "fluttering in her chest" and cough.  Patient symptoms of cough are consistent with likely URI, however she is very anxious regarding potential underlying heart failure.  There are no clinical signs of heart failure and I counseled her that  my suspicion for this is extremely low, however patient prefers to obtain BNP for further evaluation.  There are also no recent labs for review, therefore will obtain BMP, CBC, lipid profile testing, and TSH to further evaluate underlying etiology of patient's.  Symptoms are highly suggestive of PACs/PVCs, which patient has known history of.  Discussed with patient option of empiric medical management of symptoms versus repeat cardiac monitor at this time.  Shared decision was to proceed with medical therapy and reevaluate need for repeat cardiac monitor in the future.  Will start patient on metoprolol titrate 12.5 mg twice daily, will likely also improve blood pressure control as it is elevated in the office today.  Counseled her regarding signs of bradycardia and hypotension that would warrant notifying the office, patient verbalizes understanding and agreement.  Could consider reducing amlodipine if needed.  Follow-up in 4 weeks, sooner if needed, for hypertension and palpitations.   Alethia Berthold, PA-C 11/10/2020, 4:24 PM Office: [phone]

## 2020-11-10 ENCOUNTER — Other Ambulatory Visit: Payer: Self-pay

## 2020-11-10 ENCOUNTER — Encounter: Payer: Self-pay | Admitting: Student

## 2020-11-10 ENCOUNTER — Ambulatory Visit: Payer: Medicare Other | Admitting: Student

## 2020-11-10 VITALS — BP 147/83 | HR 99 | Temp 98.1°F | Resp 17 | Ht 63.0 in | Wt 173.8 lb

## 2020-11-10 DIAGNOSIS — I10 Essential (primary) hypertension: Secondary | ICD-10-CM | POA: Diagnosis not present

## 2020-11-10 DIAGNOSIS — R0602 Shortness of breath: Secondary | ICD-10-CM

## 2020-11-10 DIAGNOSIS — E782 Mixed hyperlipidemia: Secondary | ICD-10-CM

## 2020-11-10 DIAGNOSIS — I493 Ventricular premature depolarization: Secondary | ICD-10-CM

## 2020-11-10 DIAGNOSIS — R002 Palpitations: Secondary | ICD-10-CM

## 2020-11-10 MED ORDER — METOPROLOL TARTRATE 25 MG PO TABS: 12.5000 mg | ORAL_TABLET | Freq: Two times a day (BID) | ORAL | 3 refills | Status: DC

## 2020-11-13 DIAGNOSIS — R0602 Shortness of breath: Secondary | ICD-10-CM | POA: Diagnosis not present

## 2020-11-13 DIAGNOSIS — R002 Palpitations: Secondary | ICD-10-CM | POA: Diagnosis not present

## 2020-11-14 ENCOUNTER — Telehealth: Payer: Self-pay

## 2020-11-14 ENCOUNTER — Other Ambulatory Visit: Payer: Self-pay | Admitting: Cardiology

## 2020-11-14 DIAGNOSIS — I10 Essential (primary) hypertension: Secondary | ICD-10-CM

## 2020-11-14 LAB — BRAIN NATRIURETIC PEPTIDE: BNP: 60.5 pg/mL (ref 0.0–100.0)

## 2020-11-14 LAB — CBC
Hematocrit: 46.1 % (ref 34.0–46.6)
Hemoglobin: 15.8 g/dL (ref 11.1–15.9)
MCH: 31.7 pg (ref 26.6–33.0)
MCHC: 34.3 g/dL (ref 31.5–35.7)
MCV: 93 fL (ref 79–97)
Platelets: 294 10*3/uL (ref 150–450)
RBC: 4.98 x10E6/uL (ref 3.77–5.28)
RDW: 13.2 % (ref 11.7–15.4)
WBC: 7.9 10*3/uL (ref 3.4–10.8)

## 2020-11-14 LAB — BASIC METABOLIC PANEL
BUN/Creatinine Ratio: 17 (ref 12–28)
BUN: 14 mg/dL (ref 8–27)
CO2: 24 mmol/L (ref 20–29)
Calcium: 10.1 mg/dL (ref 8.7–10.3)
Chloride: 100 mmol/L (ref 96–106)
Creatinine, Ser: 0.81 mg/dL (ref 0.57–1.00)
Glucose: 111 mg/dL — ABNORMAL HIGH (ref 65–99)
Potassium: 3.1 mmol/L — ABNORMAL LOW (ref 3.5–5.2)
Sodium: 145 mmol/L — ABNORMAL HIGH (ref 134–144)
eGFR: 77 mL/min/{1.73_m2} (ref >59)

## 2020-11-14 LAB — LIPID PANEL WITH LDL/HDL RATIO
Cholesterol, Total: 201 mg/dL — ABNORMAL HIGH (ref 100–199)
HDL: 65 mg/dL (ref >39)
LDL Chol Calc (NIH): 124 mg/dL — ABNORMAL HIGH (ref 0–99)
LDL/HDL Ratio: 1.9 ratio (ref 0.0–3.2)
Triglycerides: 64 mg/dL (ref 0–149)
VLDL Cholesterol Cal: 12 mg/dL (ref 5–40)

## 2020-11-14 LAB — TSH: TSH: 2.44 u[IU]/mL (ref 0.450–4.500)

## 2020-11-14 NOTE — Telephone Encounter (Signed)
As long as she is not having symptoms of dizziness, lightheadedness, or feeling like passing out then I am okay with numbers that low

## 2020-11-14 NOTE — Telephone Encounter (Signed)
Spoke to patient she is aware

## 2020-11-14 NOTE — Telephone Encounter (Signed)
Patient called that her bp has been running low today it was 109/45 the highest it has been her systolic 802 patient was started on metoprolol on her last ov. Patient wants to know if that is fine how it is reading or what is the lowest it can be please advise

## 2020-11-19 ENCOUNTER — Other Ambulatory Visit: Payer: Self-pay | Admitting: Student

## 2020-11-19 NOTE — Progress Notes (Signed)
Spoke with patient, she has been feeling well. Advised her to stop chlorthalidone in view of hypokalemia and recent low blood pressure readings. She will monitor her blood pressure and notify our office if it is above goal. Will follow up in the office in 2 weeks. Will discuss lipid profile testing at that visit as well.  Patient aware of other results.

## 2020-12-08 ENCOUNTER — Ambulatory Visit: Payer: Medicare Other | Admitting: Student

## 2020-12-15 DIAGNOSIS — H401132 Primary open-angle glaucoma, bilateral, moderate stage: Secondary | ICD-10-CM | POA: Diagnosis not present

## 2020-12-16 ENCOUNTER — Other Ambulatory Visit: Payer: Self-pay | Admitting: Cardiology

## 2020-12-16 DIAGNOSIS — I10 Essential (primary) hypertension: Secondary | ICD-10-CM

## 2021-01-20 ENCOUNTER — Other Ambulatory Visit: Payer: Self-pay | Admitting: Cardiology

## 2021-01-20 DIAGNOSIS — I10 Essential (primary) hypertension: Secondary | ICD-10-CM

## 2021-01-22 DIAGNOSIS — Z1231 Encounter for screening mammogram for malignant neoplasm of breast: Secondary | ICD-10-CM | POA: Diagnosis not present

## 2021-02-02 NOTE — Telephone Encounter (Signed)
From pt

## 2021-02-18 ENCOUNTER — Other Ambulatory Visit: Payer: Self-pay | Admitting: Cardiology

## 2021-02-18 DIAGNOSIS — I10 Essential (primary) hypertension: Secondary | ICD-10-CM

## 2021-03-09 ENCOUNTER — Other Ambulatory Visit: Payer: Self-pay | Admitting: Student

## 2021-03-20 ENCOUNTER — Other Ambulatory Visit: Payer: Self-pay | Admitting: Cardiology

## 2021-03-20 DIAGNOSIS — I10 Essential (primary) hypertension: Secondary | ICD-10-CM

## 2021-04-23 ENCOUNTER — Other Ambulatory Visit: Payer: Self-pay | Admitting: Cardiology

## 2021-04-23 DIAGNOSIS — I10 Essential (primary) hypertension: Secondary | ICD-10-CM

## 2021-04-25 ENCOUNTER — Other Ambulatory Visit: Payer: Self-pay | Admitting: Student

## 2021-05-25 ENCOUNTER — Other Ambulatory Visit: Payer: Self-pay | Admitting: Cardiology

## 2021-05-25 DIAGNOSIS — I10 Essential (primary) hypertension: Secondary | ICD-10-CM

## 2021-06-05 ENCOUNTER — Other Ambulatory Visit: Payer: Self-pay | Admitting: Student

## 2021-06-15 DIAGNOSIS — H401131 Primary open-angle glaucoma, bilateral, mild stage: Secondary | ICD-10-CM | POA: Diagnosis not present

## 2021-06-24 ENCOUNTER — Other Ambulatory Visit: Payer: Self-pay | Admitting: Cardiology

## 2021-06-24 DIAGNOSIS — I10 Essential (primary) hypertension: Secondary | ICD-10-CM

## 2021-07-06 ENCOUNTER — Other Ambulatory Visit: Payer: Self-pay | Admitting: Student

## 2021-07-23 DIAGNOSIS — Z8739 Personal history of other diseases of the musculoskeletal system and connective tissue: Secondary | ICD-10-CM | POA: Diagnosis not present

## 2021-07-23 DIAGNOSIS — Z1329 Encounter for screening for other suspected endocrine disorder: Secondary | ICD-10-CM | POA: Diagnosis not present

## 2021-07-23 DIAGNOSIS — I10 Essential (primary) hypertension: Secondary | ICD-10-CM | POA: Diagnosis not present

## 2021-07-23 DIAGNOSIS — H409 Unspecified glaucoma: Secondary | ICD-10-CM | POA: Diagnosis not present

## 2021-07-23 DIAGNOSIS — I4430 Unspecified atrioventricular block: Secondary | ICD-10-CM | POA: Diagnosis not present

## 2021-07-23 DIAGNOSIS — E559 Vitamin D deficiency, unspecified: Secondary | ICD-10-CM | POA: Diagnosis not present

## 2021-07-23 DIAGNOSIS — Z13220 Encounter for screening for lipoid disorders: Secondary | ICD-10-CM | POA: Diagnosis not present

## 2021-07-23 DIAGNOSIS — R739 Hyperglycemia, unspecified: Secondary | ICD-10-CM | POA: Diagnosis not present

## 2021-07-27 DIAGNOSIS — I10 Essential (primary) hypertension: Secondary | ICD-10-CM | POA: Diagnosis not present

## 2021-07-27 DIAGNOSIS — R002 Palpitations: Secondary | ICD-10-CM | POA: Diagnosis not present

## 2021-07-27 DIAGNOSIS — I4430 Unspecified atrioventricular block: Secondary | ICD-10-CM | POA: Diagnosis not present

## 2021-07-27 DIAGNOSIS — E559 Vitamin D deficiency, unspecified: Secondary | ICD-10-CM | POA: Diagnosis not present

## 2021-07-27 DIAGNOSIS — F419 Anxiety disorder, unspecified: Secondary | ICD-10-CM | POA: Diagnosis not present

## 2021-07-27 DIAGNOSIS — R7301 Impaired fasting glucose: Secondary | ICD-10-CM | POA: Diagnosis not present

## 2021-07-27 DIAGNOSIS — Z1159 Encounter for screening for other viral diseases: Secondary | ICD-10-CM | POA: Diagnosis not present

## 2021-07-27 DIAGNOSIS — Z0000 Encounter for general adult medical examination without abnormal findings: Secondary | ICD-10-CM | POA: Diagnosis not present

## 2021-08-26 ENCOUNTER — Encounter: Payer: Self-pay | Admitting: Cardiology

## 2021-08-26 ENCOUNTER — Other Ambulatory Visit: Payer: Self-pay

## 2021-08-26 ENCOUNTER — Ambulatory Visit: Payer: Medicare Other | Admitting: Cardiology

## 2021-08-26 VITALS — BP 107/60 | HR 80 | Temp 98.2°F | Resp 16 | Ht 63.0 in | Wt 181.0 lb

## 2021-08-26 DIAGNOSIS — R600 Localized edema: Secondary | ICD-10-CM | POA: Diagnosis not present

## 2021-08-26 DIAGNOSIS — R002 Palpitations: Secondary | ICD-10-CM | POA: Diagnosis not present

## 2021-08-26 DIAGNOSIS — I10 Essential (primary) hypertension: Secondary | ICD-10-CM | POA: Diagnosis not present

## 2021-08-26 MED ORDER — SPIRONOLACTONE 25 MG PO TABS: 25.0000 mg | ORAL_TABLET | ORAL | 2 refills | Status: DC

## 2021-08-26 MED ORDER — METOPROLOL TARTRATE 25 MG PO TABS: 25.0000 mg | ORAL_TABLET | Freq: Two times a day (BID) | ORAL | 3 refills | Status: DC

## 2021-08-26 NOTE — Progress Notes (Signed)
Primary Physician/Referring:  Wanita Chamberlain, PA-C  Patient ID: [name], female    DOB: [birth year], 76 y.o.   MRN: [id number]  Chief Complaint  Patient presents with   Hypertension   Irregular Heart Beat   HPI: [name]  is a 76 y.o. female  emale  with History of chronic back pain and lumbar laminectomy in 2017, lumbar ago, degenerative joint disease, essential hypertension, mild hyperlipidemia.   Patient was last seen in the office in March 2022 for palpitations at which time she was started on Lopressor 12.5 mg p.o. twice daily and advised to follow-up in 4 weeks.  Unfortunately patient has been lost to follow-up until now, presenting for 11-month follow-up of hypertension, hyperlipidemia, palpitations.   Over the past 1 month or so she has started noticing recurrence of palpitations described as "fluttering" but not rapid heartbeat.  No other associated symptoms.  She is also noticed ankle edema for a while.  No chest pain, no dyspnea, no PND or orthopnea.  Past Medical History:  Diagnosis Date   Anxiety    Aortic stenosis    Arthritis    Depression    Essential hypertension 10/25/2018   Glaucoma    Gout    Hyperlipidemia    Hypertension    Syncope and collapse 10/25/2018   Family History  Problem Relation Age of Onset   Colon cancer Cousin    Heart attack Mother    Cancer Father    Diabetes Brother    Past Surgical History:  Procedure Laterality Date   APPENDECTOMY     JOINT REPLACEMENT     both knees 2009 and shoulder reconstruction 2006   KNEE ARTHROPLASTY Right    KNEE ARTHROPLASTY Left    LUMBAR LAMINECTOMY/DECOMPRESSION MICRODISCECTOMY N/A 11/28/2015   Procedure: LUMBAR LAMINECTOMY/ DECOMPRESSION MICRODISCECTOMY LUMBAR TWO-THREE, LUMBAR THREE-FOUR  ;  Surgeon: Ashok Pall, MD;  Location: [facility];  Service: Neurosurgery;  Laterality: N/A;   PARTIAL HYSTERECTOMY     SHOULDER SURGERY     reconstruction   TONSILECTOMY, ADENOIDECTOMY,  BILATERAL MYRINGOTOMY AND TUBES     Social History   Tobacco Use   Smoking status: Never   Smokeless tobacco: Never  Substance Use Topics   Alcohol use: No   Marital Status: Widowed    ROS   Review of Systems  Cardiovascular:  Positive for irregular heartbeat and leg swelling. Negative for chest pain, claudication, dyspnea on exertion and orthopnea.  Musculoskeletal:  Positive for arthritis and back pain.  Objective  Blood pressure 107/60, pulse 80, temperature 98.2 F (36.8 C), temperature source Temporal, resp. rate 16, height 5\' 3"  (1.6 m), weight 181 lb (82.1 kg), SpO2 96 %. Body mass index is 32.06 kg/m.  Vitals with BMI 08/26/2021 11/10/2020 02/27/2020  Height 5\' 3"  5\' 3"  5\' 3"   Weight 181 lbs 173 lbs 13 oz 177 lbs 13 oz  BMI 32.07 65.68 12.7  Systolic 517 001 749  Diastolic 60 83 74  Pulse 80 99 77    Physical Exam Constitutional:      Comments: Moderately built and mildly obese in no acute distress.  Neck:     Thyroid: No thyromegaly.     Vascular: No carotid bruit or JVD.  Cardiovascular:     Rate and Rhythm: Normal rate and regular rhythm.     Pulses: Intact distal pulses.     Heart sounds: Normal heart sounds. No murmur heard.   No gallop.  Pulmonary:  Effort: Pulmonary effort is normal.     Breath sounds: Normal breath sounds.  Abdominal:     General: Bowel sounds are normal.     Palpations: Abdomen is soft.  Musculoskeletal:     Right lower leg: Edema (2+ ankle edema) present.     Left lower leg: Edema (2+ ankle edema) present.  Skin:    General: Skin is warm.  Neurological:     General: No focal deficit present.     Mental Status: She is oriented to person, place, and time.   Allergies   Allergies  Allergen Reactions   Amlodipine Swelling   Sulfonamide Derivatives Itching and Swelling    Redness   Valsartan Cough    Medications Prior to Visit:   Outpatient Medications Prior to Visit  Medication Sig Dispense Refill   Propylene Glycol 0.6 %  SOLN Place 1 drop into both eyes daily as needed (for dry eyes).     TRAVATAN Z 0.004 % SOLN ophthalmic solution Place 1 drop into both eyes at bedtime.      amLODipine (NORVASC) 10 MG tablet TAKE 1 TABLET EVERY AFTERNOON. 30 tablet 0   metoprolol tartrate (LOPRESSOR) 25 MG tablet TAKE (1/2) TABLET BY MOUTH 2 TIMES A DAY. 90 tablet 0   Coenzyme Q10 (COQ10) 200 MG CAPS Take 200 mg by mouth daily.  (Patient not taking: Reported on 08/26/2021)     Omega-3 Fatty Acids (OMEGA 3 PO) Take 2,000 mg by mouth daily.  (Patient not taking: Reported on 08/26/2021)     ASTAXANTHIN PO Take 1 tablet by mouth daily.     buPROPion (WELLBUTRIN XL) 300 MG 24 hr tablet Take 300 mg by mouth daily.     gabapentin (NEURONTIN) 100 MG capsule Take 1 capsule (100 mg total) by mouth 3 (three) times daily. (Patient taking differently: Take 100 mg by mouth daily as needed.) 90 capsule 2   HYDROcodone-acetaminophen (NORCO/VICODIN) 5-325 MG tablet Take 1 tablet by mouth every 6 (six) hours as needed for moderate pain. (Patient taking differently: Take 1 tablet by mouth as needed for moderate pain.) 20 tablet 0   LORazepam (ATIVAN) 0.5 MG tablet Take 0.5 mg by mouth 2 (two) times daily as needed for anxiety.     OVER THE COUNTER MEDICATION Take 1 tablet by mouth daily. Circulation and Vein,- grapeseed, pine bark, horse chestnut, and butchers broom     Resveratrol 100 MG CAPS Take 100 mg by mouth daily.     No facility-administered medications prior to visit.   Final Medications at End of Visit    Current Meds  Medication Sig   Propylene Glycol 0.6 % SOLN Place 1 drop into both eyes daily as needed (for dry eyes).   spironolactone (ALDACTONE) 25 MG tablet Take 1 tablet (25 mg total) by mouth every morning.   TRAVATAN Z 0.004 % SOLN ophthalmic solution Place 1 drop into both eyes at bedtime.    [DISCONTINUED] amLODipine (NORVASC) 10 MG tablet TAKE 1 TABLET EVERY AFTERNOON.   [DISCONTINUED] metoprolol tartrate (LOPRESSOR) 25 MG  tablet TAKE (1/2) TABLET BY MOUTH 2 TIMES A DAY.   Laboratory examination:   CMP Latest Ref Rng & Units 11/13/2020 09/18/2019 11/08/2018  Glucose 65 - 99 mg/dL 111(H) 124(H) 106(H)  BUN 8 - 27 mg/dL 14 11 14   Creatinine 0.57 - 1.00 mg/dL 0.81 0.87 0.84  Sodium 134 - 144 mmol/L 145(H) 140 142  Potassium 3.5 - 5.2 mmol/L 3.1(L) CANCELED 4.5  Chloride 96 - 106 mmol/L  100 100 100  CO2 20 - 29 mmol/L 24 22 24   Calcium 8.7 - 10.3 mg/dL 10.1 - 10.6(H)  Total Protein 6.5 - 8.1 g/dL - - -  Total Bilirubin 0.3 - 1.2 mg/dL - - -  Alkaline Phos 38 - 126 U/L - - -  AST 15 - 41 U/L - - -  ALT 0 - 44 U/L - - -   CBC Latest Ref Rng & Units 11/13/2020 10/13/2018 09/30/2018  WBC 3.4 - 10.8 x10E3/uL 7.9 9.4 9.8  Hemoglobin 11.1 - 15.9 g/dL 15.8 15.6(H) 15.8(H)  Hematocrit 34.0 - 46.6 % 46.1 46.1(H) 47.2(H)  Platelets 150 - 450 x10E3/uL 294 230 252   Lipid Panel     Component Value Date/Time   CHOL 201 (H) 11/13/2020 1009   TRIG 64 11/13/2020 1009   HDL 65 11/13/2020 1009   LDLCALC 124 (H) 11/13/2020 1009  NHDL Chol 136   HEMOGLOBIN A1C No results found for: HGBA1C, MPG TSH Recent Labs    11/13/20 1009  TSH 2.440   External labs:  Vitamin D reduced at 27.4.  Radiology    No results found.  Cardiac Studies:   Event monitor 30 days 2018-10-30: Predominant rhythm is normal sinus rhythm.  There were 9 events reported revealing normal sinus rhythm.  Occasional PACs were noted.  No atrial fibrillation, no heart block.   Echocardiogram at PCP   10/16/2018: LV size is normal, mild LVH, EF 66-44%, grade 1 diastolic dysfunction with elevated LVEDP.  Mildly calcified aortic valve with mild aortic regurgitation and mild aortic valve stenosis.  Mild tricuspid regurgitation, no pulmonary hypertension.   Exercise myoview stress 11/10/2018:  1. Lexiscan stress test was performed. Exercise capacity was not assessed. No stress symptoms reported. Resting blood pressure was 150/82 mmHg and peak effect  blood pressure was 142/68 mmHg. Stress EKG is non diagnostic for ischemia as it is a pharmacologic stress.  2. The overall quality of the study is good. There is no evidence of abnormal lung activity. Stress and rest SPECT images demonstrate homogeneous tracer distribution throughout the myocardium. Gated SPECT imaging reveals normal myocardial thickening and wall motion. The left ventricular ejection fraction was normal (59%).   3. Low risk study.    EKG   EKG 08/26/2021: Normal sinus rhythm at rate of 68 bpm, normal axis.  No evidence of ischemia, normal EKG. No significant change from 11/10/2020  Assessment   Essential hypertension - Plan: EKG 12-Lead, metoprolol tartrate (LOPRESSOR) 25 MG tablet, spironolactone (ALDACTONE) 25 MG tablet, Basic metabolic panel  Palpitations - Plan: spironolactone (ALDACTONE) 25 MG tablet  Bilateral leg edema  Meds ordered this encounter  Medications   metoprolol tartrate (LOPRESSOR) 25 MG tablet    Sig: Take 1 tablet (25 mg total) by mouth 2 (two) times daily.    Dispense:  180 tablet    Refill:  3   spironolactone (ALDACTONE) 25 MG tablet    Sig: Take 1 tablet (25 mg total) by mouth every morning.    Dispense:  30 tablet    Refill:  2    Discontinue Amlodipine   Medications Discontinued During This Encounter  Medication Reason   LORazepam (ATIVAN) 0.5 MG tablet    HYDROcodone-acetaminophen (NORCO/VICODIN) 5-325 MG tablet    gabapentin (NEURONTIN) 100 MG capsule    buPROPion (WELLBUTRIN XL) 300 MG 24 hr tablet    ASTAXANTHIN PO    OVER THE COUNTER MEDICATION    Resveratrol 100 MG CAPS    amLODipine (NORVASC)  10 MG tablet Side effect (s)   metoprolol tartrate (LOPRESSOR) 25 MG tablet Reorder    Recommendations:   [name]  is a 76 y.o.  female  with History of chronic back pain and lumbar laminectomy in 2017, lumbar ago, degenerative joint disease, essential hypertension, mild hyperlipidemia.   Patient was last seen in the  office in March 2022 for palpitations at which time she was started on Lopressor 12.5 mg p.o. twice daily and advised to follow-up in 4 weeks.  Unfortunately patient has been lost to follow-up until now, presenting for 53-month follow-up of hypertension, hyperlipidemia, palpitations.   Due to recurrence of palpitations and uncontrolled hypertension and leg edema, will discontinue amlodipine and switch her to spironolactone 25 mg daily, will also increase metoprolol to tartrate from 12.5 mg twice daily to 25 mg twice daily.  Reviewed her external labs, she does have mild hyperlipidemia, non-HDL cholesterol this is about 7 points above normal limits, she has no known vascular disease, hence in the absence of diabetes, smoking, not sure it will impart any significant benefit.  Hence we will discuss again regarding making dietary changes.  I would like to see her back in 2 months for follow-up.  She will get BMP done in 2 weeks after starting spironolactone.   Adrian Prows, PA-C 08/26/2021, 5:19 PM Office: [phone]

## 2021-09-07 DIAGNOSIS — Z20822 Contact with and (suspected) exposure to covid-19: Secondary | ICD-10-CM | POA: Diagnosis not present

## 2021-09-08 DIAGNOSIS — F411 Generalized anxiety disorder: Secondary | ICD-10-CM | POA: Diagnosis not present

## 2021-09-08 DIAGNOSIS — I10 Essential (primary) hypertension: Secondary | ICD-10-CM | POA: Diagnosis not present

## 2021-09-08 DIAGNOSIS — Z6833 Body mass index (BMI) 33.0-33.9, adult: Secondary | ICD-10-CM | POA: Diagnosis not present

## 2021-09-09 DIAGNOSIS — I10 Essential (primary) hypertension: Secondary | ICD-10-CM | POA: Diagnosis not present

## 2021-09-10 LAB — BASIC METABOLIC PANEL
BUN/Creatinine Ratio: 10 — ABNORMAL LOW (ref 12–28)
BUN: 8 mg/dL (ref 8–27)
CO2: 20 mmol/L (ref 20–29)
Calcium: 10.2 mg/dL (ref 8.7–10.3)
Chloride: 104 mmol/L (ref 96–106)
Creatinine, Ser: 0.8 mg/dL (ref 0.57–1.00)
Glucose: 118 mg/dL — ABNORMAL HIGH (ref 70–99)
Potassium: 4.1 mmol/L (ref 3.5–5.2)
Sodium: 140 mmol/L (ref 134–144)
eGFR: 78 mL/min/{1.73_m2} (ref >59)

## 2021-09-11 ENCOUNTER — Encounter: Payer: Self-pay | Admitting: Cardiology

## 2021-09-11 DIAGNOSIS — I10 Essential (primary) hypertension: Secondary | ICD-10-CM

## 2021-09-11 MED ORDER — HYDRALAZINE HCL 25 MG PO TABS: 25.0000 mg | ORAL_TABLET | Freq: Three times a day (TID) | ORAL | 3 refills | Status: DC

## 2021-09-11 NOTE — Telephone Encounter (Signed)
ICD-10-CM   1. Essential hypertension  I10 hydrALAZINE (APRESOLINE) 25 MG tablet     Meds ordered this encounter  Medications   hydrALAZINE (APRESOLINE) 25 MG tablet    Sig: Take 1 tablet (25 mg total) by mouth 3 (three) times daily. Tale extra tablet if SBP >140 mm Hg.    Dispense:  120 tablet    Refill:  3

## 2021-09-11 NOTE — Telephone Encounter (Signed)
From pt

## 2021-09-14 NOTE — Telephone Encounter (Signed)
From pt

## 2021-09-15 DIAGNOSIS — R030 Elevated blood-pressure reading, without diagnosis of hypertension: Secondary | ICD-10-CM | POA: Diagnosis not present

## 2021-09-29 DIAGNOSIS — Z20822 Contact with and (suspected) exposure to covid-19: Secondary | ICD-10-CM | POA: Diagnosis not present

## 2021-10-06 DIAGNOSIS — Z6833 Body mass index (BMI) 33.0-33.9, adult: Secondary | ICD-10-CM | POA: Diagnosis not present

## 2021-10-06 DIAGNOSIS — F411 Generalized anxiety disorder: Secondary | ICD-10-CM | POA: Diagnosis not present

## 2021-10-07 DIAGNOSIS — Z20822 Contact with and (suspected) exposure to covid-19: Secondary | ICD-10-CM | POA: Diagnosis not present

## 2021-10-09 ENCOUNTER — Encounter: Payer: Self-pay | Admitting: Cardiology

## 2021-10-09 DIAGNOSIS — I10 Essential (primary) hypertension: Secondary | ICD-10-CM

## 2021-10-09 DIAGNOSIS — R002 Palpitations: Secondary | ICD-10-CM

## 2021-10-09 DIAGNOSIS — R0602 Shortness of breath: Secondary | ICD-10-CM

## 2021-10-09 NOTE — Telephone Encounter (Signed)
ICD-10-CM   1. Essential hypertension  I10 Ambulatory referral to Cardiology    2. Palpitations  R00.2 Ambulatory referral to Cardiology    3. Shortness of breath  R06.02 Ambulatory referral to Cardiology     Orders Placed This Encounter  Procedures   Ambulatory referral to Cardiology    Referral Priority:   Routine    Referral Type:   Consultation    Referral Reason:   Specialty Services Required    Requested Specialty:   Cardiology    Number of Visits Requested:   1     For convenience patient would like to switch cardiology group, referral sent to [facility] at [place], [place]

## 2021-10-21 DIAGNOSIS — R0989 Other specified symptoms and signs involving the circulatory and respiratory systems: Secondary | ICD-10-CM | POA: Diagnosis not present

## 2021-10-21 DIAGNOSIS — Z6832 Body mass index (BMI) 32.0-32.9, adult: Secondary | ICD-10-CM | POA: Diagnosis not present

## 2021-10-21 DIAGNOSIS — R209 Unspecified disturbances of skin sensation: Secondary | ICD-10-CM | POA: Diagnosis not present

## 2021-10-26 ENCOUNTER — Ambulatory Visit: Payer: Medicare Other | Admitting: Cardiology

## 2021-10-26 DIAGNOSIS — R209 Unspecified disturbances of skin sensation: Secondary | ICD-10-CM | POA: Diagnosis not present

## 2021-10-26 DIAGNOSIS — R0989 Other specified symptoms and signs involving the circulatory and respiratory systems: Secondary | ICD-10-CM | POA: Diagnosis not present

## 2021-10-29 DIAGNOSIS — L821 Other seborrheic keratosis: Secondary | ICD-10-CM | POA: Diagnosis not present

## 2021-11-16 ENCOUNTER — Other Ambulatory Visit: Payer: Self-pay | Admitting: Cardiology

## 2021-11-16 DIAGNOSIS — I10 Essential (primary) hypertension: Secondary | ICD-10-CM

## 2021-11-16 DIAGNOSIS — R002 Palpitations: Secondary | ICD-10-CM

## 2021-11-23 ENCOUNTER — Ambulatory Visit (INDEPENDENT_AMBULATORY_CARE_PROVIDER_SITE_OTHER): Payer: Medicare Other | Admitting: Cardiology

## 2021-11-23 ENCOUNTER — Encounter: Payer: Self-pay | Admitting: Cardiology

## 2021-11-23 VITALS — BP 156/80 | HR 55 | Ht 64.0 in | Wt 178.6 lb

## 2021-11-23 DIAGNOSIS — R002 Palpitations: Secondary | ICD-10-CM

## 2021-11-23 DIAGNOSIS — I10 Essential (primary) hypertension: Secondary | ICD-10-CM | POA: Diagnosis not present

## 2021-11-23 DIAGNOSIS — I350 Nonrheumatic aortic (valve) stenosis: Secondary | ICD-10-CM

## 2021-11-23 NOTE — Progress Notes (Signed)
? ? ?Cardiology Office Note ? ?Date: 11/23/2021  ? ?ID: [name], DOB 03-14-1946, MRN [id number] ? ?PCP:  Leeanne Rio, MD  ?Cardiologist:  Rozann Lesches, MD ?Electrophysiologist:  None  ? ?Chief Complaint  ?Patient presents with  ? Establish cardiology follow-up  ? ? ?History of Present Illness: ?[name] is a 76 y.o. female referred by Dr. Einar Gip to establish cardiology follow-up closer to her home.  I reviewed the available records and updated the chart.  Last visit with cardiology was in January of this year, follow-up of palpitations discussed at that time. I reviewed her prior cardiac testing from 2020 as noted below. ? ?She does report an interval bout with palpitations since visit in January, however this has settled down completely.  She reports compliance with Lopressor 25 mg twice daily.  Also taking Aldactone 25 mg daily for blood pressure.  She does have a home blood pressure cuff, I have encouraged her to check blood pressure periodically.  She has allergies/intolerances to amlodipine and valsartan.  Has tried hydralazine in the past, but is not taking it currently.  This would be a reasonable option. ? ?Past Medical History:  ?Diagnosis Date  ? Anxiety   ? Aortic stenosis   ? Arthritis   ? Depression   ? Essential hypertension   ? Glaucoma   ? Gout   ? Hyperlipidemia   ? Palpitations   ? ? ?Past Surgical History:  ?Procedure Laterality Date  ? APPENDECTOMY    ? JOINT REPLACEMENT    ? both knees 2009 and shoulder reconstruction 2006  ? KNEE ARTHROPLASTY Right   ? KNEE ARTHROPLASTY Left   ? LUMBAR LAMINECTOMY/DECOMPRESSION MICRODISCECTOMY N/A 11/28/2015  ? Procedure: LUMBAR LAMINECTOMY/ DECOMPRESSION MICRODISCECTOMY LUMBAR TWO-THREE, LUMBAR THREE-FOUR  ;  Surgeon: Ashok Pall, MD;  Location: [facility];  Service: Neurosurgery;  Laterality: N/A;  ? PARTIAL HYSTERECTOMY    ? SHOULDER SURGERY    ? reconstruction  ? TONSILECTOMY, ADENOIDECTOMY, BILATERAL MYRINGOTOMY AND TUBES     ? ? ?Current Outpatient Medications  ?Medication Sig Dispense Refill  ? Coenzyme Q10 (COQ10) 200 MG CAPS Take 200 mg by mouth daily.    ? metoprolol tartrate (LOPRESSOR) 25 MG tablet Take 1 tablet (25 mg total) by mouth 2 (two) times daily. 180 tablet 3  ? Omega-3 Fatty Acids (OMEGA 3 PO) Take 2,000 mg by mouth daily.    ? Propylene Glycol 0.6 % SOLN Place 1 drop into both eyes daily as needed (for dry eyes).    ? spironolactone (ALDACTONE) 25 MG tablet TAKE 1 TABLET BY MOUTH EVERY MORNING. 30 tablet 0  ? TRAVATAN Z 0.004 % SOLN ophthalmic solution Place 1 drop into both eyes at bedtime.     ? ?No current facility-administered medications for this visit.  ? ?Allergies:  Amlodipine, Sulfonamide derivatives, and Valsartan  ? ?Social History: The patient  reports that she has never smoked. She has never used smokeless tobacco. She reports that she does not drink alcohol and does not use drugs.  ? ?Family History: The patient's family history includes Cancer in her father; Colon cancer in her cousin; Diabetes in her brother; Heart attack in her mother.  ? ?ROS: No syncope. ? ?Physical Exam: ?VS:  BP (!) 156/80   Pulse (!) 55   Ht '5\' 4"'$  (1.626 m)   Wt 178 lb 9.6 oz (81 kg)   SpO2 97%   BMI 30.66 kg/m? , BMI Body mass index is 30.66 kg/m?Marland Kitchen ? ?  Wt Readings from Last 3 Encounters:  ?11/23/21 178 lb 9.6 oz (81 kg)  ?08/26/21 181 lb (82.1 kg)  ?11/10/20 173 lb 12.8 oz (78.8 kg)  ?  ?General: Patient appears comfortable at rest. ?HEENT: Conjunctiva and lids normal. ?Neck: Supple, no elevated JVP or carotid bruits, no thyromegaly. ?Lungs: Clear to auscultation, nonlabored breathing at rest. ?Cardiac: Regular rate and rhythm, no S3, 1/6 systolic murmur, no pericardial rub. ?Abdomen: Soft, bowel sounds present. ?Extremities: No pitting edema, distal pulses 2+. ?Skin: Warm and dry. ?Musculoskeletal: No kyphosis. ?Neuropsychiatric: Alert and oriented x3, affect grossly appropriate. ? ?ECG:  An ECG dated 08/26/2021 was personally  reviewed today and demonstrated:  Sinus rhythm. ? ?Recent Labwork: ?09/09/2021: BUN 8; Creatinine, Ser 0.80; Potassium 4.1; Sodium 140  ?   ?Component Value Date/Time  ? CHOL 201 (H) 11/13/2020 1009  ? TRIG 64 11/13/2020 1009  ? HDL 65 11/13/2020 1009  ? LDLCALC 124 (H) 11/13/2020 1009  ? ? ?Other Studies Reviewed Today: ? ?Echocardiogram 10/16/2018: ?LV size is normal, mild LVH, EF 73-42%, grade 1 diastolic dysfunction with elevated LVEDP.  Mildly calcified aortic valve with mild aortic regurgitation and mild aortic valve stenosis.  Mild tricuspid regurgitation, no pulmonary hypertension. ? ?Cardiac monitor 10/25/2018: ?Predominant rhythm is normal sinus rhythm.  There were 9 events reported revealing normal sinus rhythm.  Occasional PACs were noted.  No atrial fibrillation, no heart block. ? ?Exercise Myoview 11/10/2018: ?Exercise myoview stress 11/10/2018:  ?1. Lexiscan stress test was performed. Exercise capacity was not assessed. No stress symptoms reported. Resting blood pressure was 150/82 mmHg and peak effect blood pressure was 142/68 mmHg. Stress EKG is non diagnostic for ischemia as it is a pharmacologic stress.  ?2. The overall quality of the study is good. There is no evidence of abnormal lung activity. Stress and rest SPECT images demonstrate homogeneous tracer distribution throughout the myocardium. Gated SPECT imaging reveals normal myocardial thickening and wall motion. The left ventricular ejection fraction was normal (59%).   ?3. Low risk study.  ? ?Assessment and Plan: ? ?1.  History of palpitations without documented arrhythmia based on cardiac monitor from 2020.  She did have occasional PACs noted at that time, no atrial fibrillation.  Would plan to continue observation on Lopressor for now unless symptoms escalate. ? ?2.  Mild aortic stenosis by echocardiogram in 2020.  Soft murmur noted on examination.  She is asymptomatic and this will be followed over time. ? ?3.  Essential hypertension.  Blood  pressure elevated today.  She is on Lopressor and Aldactone with history of allergy/intolerance to Norvasc (swelling) and valsartan (cough).  Hydralazine would be a reasonable next step if necessary. ? ?Medication Adjustments/Labs and Tests Ordered: ?Current medicines are reviewed at length with the patient today.  Concerns regarding medicines are outlined above.  ? ?Tests Ordered: ?No orders of the defined types were placed in this encounter. ? ? ?Medication Changes: ?No orders of the defined types were placed in this encounter. ? ? ?Disposition:  Follow up  6 months. ? ?Signed, ?Satira Sark, MD, [facility] ?11/23/2021 1:39 PM    ?[facility] ?[place], [place], [place] [address] ?Phone: [phone]; Fax: [phone]  ?

## 2021-11-23 NOTE — Patient Instructions (Signed)
Follow-Up: ?Follow up with Dr. McDowell in 6 months.  ? ?Any Other Special Instructions Will Be Listed Below (If Applicable). ? ? ? ? ?If you need a refill on your cardiac medications before your next appointment, please call your pharmacy. ? ?

## 2021-12-08 DIAGNOSIS — Z20822 Contact with and (suspected) exposure to covid-19: Secondary | ICD-10-CM | POA: Diagnosis not present

## 2021-12-21 ENCOUNTER — Other Ambulatory Visit: Payer: Self-pay | Admitting: Cardiology

## 2021-12-21 ENCOUNTER — Telehealth: Payer: Self-pay | Admitting: Cardiology

## 2021-12-21 ENCOUNTER — Other Ambulatory Visit: Payer: Self-pay

## 2021-12-21 DIAGNOSIS — R002 Palpitations: Secondary | ICD-10-CM

## 2021-12-21 DIAGNOSIS — I10 Essential (primary) hypertension: Secondary | ICD-10-CM

## 2021-12-21 NOTE — Telephone Encounter (Signed)
Ref for  ? ?spironolactone (ALDACTONE) 25 MG tablet ? ?[place] [address]  [phone] ?

## 2021-12-21 NOTE — Telephone Encounter (Signed)
Refill has been sent.  °

## 2022-01-20 ENCOUNTER — Other Ambulatory Visit: Payer: Self-pay | Admitting: Cardiology

## 2022-01-20 DIAGNOSIS — I10 Essential (primary) hypertension: Secondary | ICD-10-CM

## 2022-01-20 DIAGNOSIS — R002 Palpitations: Secondary | ICD-10-CM

## 2022-01-26 DIAGNOSIS — M79672 Pain in left foot: Secondary | ICD-10-CM | POA: Diagnosis not present

## 2022-01-26 DIAGNOSIS — M79671 Pain in right foot: Secondary | ICD-10-CM | POA: Diagnosis not present

## 2022-01-26 DIAGNOSIS — G629 Polyneuropathy, unspecified: Secondary | ICD-10-CM | POA: Diagnosis not present

## 2022-02-13 ENCOUNTER — Other Ambulatory Visit: Payer: Self-pay | Admitting: Cardiology

## 2022-02-13 DIAGNOSIS — R002 Palpitations: Secondary | ICD-10-CM

## 2022-02-13 DIAGNOSIS — I10 Essential (primary) hypertension: Secondary | ICD-10-CM

## 2022-02-25 DIAGNOSIS — G629 Polyneuropathy, unspecified: Secondary | ICD-10-CM | POA: Diagnosis not present

## 2022-03-29 DIAGNOSIS — H401131 Primary open-angle glaucoma, bilateral, mild stage: Secondary | ICD-10-CM | POA: Diagnosis not present

## 2022-04-16 ENCOUNTER — Other Ambulatory Visit: Payer: Self-pay | Admitting: Cardiology

## 2022-04-16 DIAGNOSIS — I10 Essential (primary) hypertension: Secondary | ICD-10-CM

## 2022-04-16 DIAGNOSIS — R002 Palpitations: Secondary | ICD-10-CM

## 2022-05-18 ENCOUNTER — Other Ambulatory Visit: Payer: Self-pay | Admitting: Cardiology

## 2022-05-18 DIAGNOSIS — I10 Essential (primary) hypertension: Secondary | ICD-10-CM

## 2022-05-18 DIAGNOSIS — R002 Palpitations: Secondary | ICD-10-CM

## 2022-05-20 ENCOUNTER — Telehealth: Payer: Self-pay | Admitting: Cardiology

## 2022-05-20 ENCOUNTER — Other Ambulatory Visit: Payer: Self-pay

## 2022-05-20 DIAGNOSIS — I10 Essential (primary) hypertension: Secondary | ICD-10-CM

## 2022-05-20 DIAGNOSIS — R002 Palpitations: Secondary | ICD-10-CM

## 2022-05-20 MED ORDER — SPIRONOLACTONE 25 MG PO TABS: 25.0000 mg | ORAL_TABLET | Freq: Every morning | ORAL | 1 refills | Status: DC

## 2022-05-20 MED ORDER — SPIRONOLACTONE 25 MG PO TABS: 25.0000 mg | ORAL_TABLET | Freq: Every morning | ORAL | 0 refills | Status: DC

## 2022-05-20 NOTE — Telephone Encounter (Signed)
Medication has been refilled gave patient a 30 day with no refills put on the pharmacy note patient needs an appointment for additional refills

## 2022-05-20 NOTE — Telephone Encounter (Signed)
Patient needs refill on following medication, and says pharmacy will not refill it unless someone calls to places it in for her. She doesn't have a follow up appointment scheduled, but she was seen in January 2023. If need be, we can schedule her a follow up appointment.  spironolactone (ALDACTONE) 25 MG tablet

## 2022-05-24 NOTE — Progress Notes (Signed)
Cardiology Office Note  Date: 05/25/2022   ID: SHERAL PFAHLER, DOB 12/21/1945, MRN [id number]  PCP:  Leeanne Rio, MD  Cardiologist:  Rozann Lesches, MD Electrophysiologist:  None   Chief Complaint  Patient presents with   Cardiac follow-up    History of Present Illness: [name] is a 76 y.o. female last seen in April.  She is here for a routine visit.  Reports no significant palpitations in the interim, no dizziness or syncope.  We went over her medications and we will plan to refill Aldactone and Lopressor going forward, no changes to current dose.  Her last echocardiogram was in 2020 at which point she had evidence of mild aortic stenosis and mild aortic regurgitation.  Follow-up study will be planned for surveillance.  Past Medical History:  Diagnosis Date   Anxiety    Aortic stenosis    Arthritis    Depression    Essential hypertension    Glaucoma    Gout    Hyperlipidemia    Palpitations     Past Surgical History:  Procedure Laterality Date   APPENDECTOMY     JOINT REPLACEMENT     both knees 2009 and shoulder reconstruction 2006   KNEE ARTHROPLASTY Right    KNEE ARTHROPLASTY Left    LUMBAR LAMINECTOMY/DECOMPRESSION MICRODISCECTOMY N/A 11/28/2015   Procedure: LUMBAR LAMINECTOMY/ DECOMPRESSION MICRODISCECTOMY LUMBAR TWO-THREE, LUMBAR THREE-FOUR  ;  Surgeon: Ashok Pall, MD;  Location: [facility];  Service: Neurosurgery;  Laterality: N/A;   PARTIAL HYSTERECTOMY     SHOULDER SURGERY     reconstruction   TONSILECTOMY, ADENOIDECTOMY, BILATERAL MYRINGOTOMY AND TUBES      Current Outpatient Medications  Medication Sig Dispense Refill   gabapentin (NEURONTIN) 100 MG capsule Take 100 mg by mouth 3 (three) times daily.     TRAVATAN Z 0.004 % SOLN ophthalmic solution Place 1 drop into both eyes at bedtime.      metoprolol tartrate (LOPRESSOR) 25 MG tablet Take 1 tablet (25 mg total) by mouth 2 (two) times daily. 180 tablet 3    spironolactone (ALDACTONE) 25 MG tablet Take 1 tablet (25 mg total) by mouth every morning. 90 tablet 3   No current facility-administered medications for this visit.   Allergies:  Amlodipine, Sulfonamide derivatives, and Valsartan   ROS: No palpitations or syncope.  Physical Exam: VS:  BP (!) 140/80   Pulse 68   Ht '5\' 4"'$  (1.626 m)   Wt 183 lb 9.6 oz (83.3 kg)   SpO2 96%   BMI 31.51 kg/m , BMI Body mass index is 31.51 kg/m.  Wt Readings from Last 3 Encounters:  05/25/22 183 lb 9.6 oz (83.3 kg)  11/23/21 178 lb 9.6 oz (81 kg)  08/26/21 181 lb (82.1 kg)    General: Patient appears comfortable at rest. HEENT: Conjunctiva and lids normal. Neck: Supple, no elevated JVP or carotid bruits, no thyromegaly. Lungs: Clear to auscultation, nonlabored breathing at rest. Cardiac: Regular rate and rhythm, no S3, 1/6 systolic murmur, no pericardial rub. Extremities: No pitting edema.  ECG:  An ECG dated 08/26/2021 was personally reviewed today and demonstrated:  Sinus rhythm.  Recent Labwork: 09/09/2021: BUN 8; Creatinine, Ser 0.80; Potassium 4.1; Sodium 140     Component Value Date/Time   CHOL 201 (H) 11/13/2020 1009   TRIG 64 11/13/2020 1009   HDL 65 11/13/2020 1009   LDLCALC 124 (H) 11/13/2020 1009    Other Studies Reviewed Today:  Echocardiogram 10/16/2018: LV size is  normal, mild LVH, EF 35-36%, grade 1 diastolic dysfunction with elevated LVEDP.  Mildly calcified aortic valve with mild aortic regurgitation and mild aortic valve stenosis.  Mild tricuspid regurgitation, no pulmonary hypertension.   Cardiac monitor 10/25/2018: Predominant rhythm is normal sinus rhythm.  There were 9 events reported revealing normal sinus rhythm.  Occasional PACs were noted.  No atrial fibrillation, no heart block.   Exercise Myoview 11/10/2018: Exercise myoview stress 11/10/2018:  1. Lexiscan stress test was performed. Exercise capacity was not assessed. No stress symptoms reported. Resting blood  pressure was 150/82 mmHg and peak effect blood pressure was 142/68 mmHg. Stress EKG is non diagnostic for ischemia as it is a pharmacologic stress.  2. The overall quality of the study is good. There is no evidence of abnormal lung activity. Stress and rest SPECT images demonstrate homogeneous tracer distribution throughout the myocardium. Gated SPECT imaging reveals normal myocardial thickening and wall motion. The left ventricular ejection fraction was normal (59%).   3. Low risk study.   Assessment and Plan:  1.  Palpitations, currently quiescent on Lopressor.  Continue observation.  2.  Mild aortic stenosis and mild aortic regurgitation by echocardiogram in February 2020.  Follow-up echocardiogram will be obtained for surveillance.  3.  Essential hypertension, currently on Lopressor and Aldactone.  She has prior allergies/intolerance to Norvasc and valsartan.  No changes were made today.  Keep follow-up with Dr. Huel Cote.  Medication Adjustments/Labs and Tests Ordered: Current medicines are reviewed at length with the patient today.  Concerns regarding medicines are outlined above.   Tests Ordered: Orders Placed This Encounter  Procedures   ECHOCARDIOGRAM COMPLETE    Medication Changes: Meds ordered this encounter  Medications   spironolactone (ALDACTONE) 25 MG tablet    Sig: Take 1 tablet (25 mg total) by mouth every morning.    Dispense:  90 tablet    Refill:  3   metoprolol tartrate (LOPRESSOR) 25 MG tablet    Sig: Take 1 tablet (25 mg total) by mouth 2 (two) times daily.    Dispense:  180 tablet    Refill:  3    Disposition:  Follow up  1 year.  Signed, Satira Sark, MD, [facility] 05/25/2022 9:57 AM    [place] [facility] [place], [place], [place] [address] Phone: [phone]; Fax: [phone]

## 2022-05-25 ENCOUNTER — Ambulatory Visit: Payer: Medicare Other | Attending: Cardiology | Admitting: Cardiology

## 2022-05-25 ENCOUNTER — Encounter: Payer: Self-pay | Admitting: Cardiology

## 2022-05-25 VITALS — BP 140/80 | HR 68 | Ht 64.0 in | Wt 183.6 lb

## 2022-05-25 DIAGNOSIS — R002 Palpitations: Secondary | ICD-10-CM | POA: Diagnosis not present

## 2022-05-25 DIAGNOSIS — I350 Nonrheumatic aortic (valve) stenosis: Secondary | ICD-10-CM

## 2022-05-25 DIAGNOSIS — I10 Essential (primary) hypertension: Secondary | ICD-10-CM

## 2022-05-25 MED ORDER — METOPROLOL TARTRATE 25 MG PO TABS: 25.0000 mg | ORAL_TABLET | Freq: Two times a day (BID) | ORAL | 3 refills | Status: DC

## 2022-05-25 MED ORDER — SPIRONOLACTONE 25 MG PO TABS: 25.0000 mg | ORAL_TABLET | Freq: Every morning | ORAL | 3 refills | Status: DC

## 2022-05-25 NOTE — Patient Instructions (Signed)
Medication Instructions:  Your physician recommends that you continue on your current medications as directed. Please refer to the Current Medication list given to you today.  Labwork: none  Testing/Procedures: Your physician has requested that you have an echocardiogram. Echocardiography is a painless test that uses sound waves to create images of your heart. It provides your doctor with information about the size and shape of your heart and how well your heart's chambers and valves are working. This procedure takes approximately one hour. There are no restrictions for this procedure.  Follow-Up: Your physician recommends that you schedule a follow-up appointment in: 1 year. You will receive a reminder call in the mail in about 10 months reminding you to call and schedule your appointment. If you don't receive this call, please contact our office.  Any Other Special Instructions Will Be Listed Below (If Applicable).  If you need a refill on your cardiac medications before your next appointment, please call your pharmacy.

## 2022-05-26 ENCOUNTER — Ambulatory Visit: Payer: Medicare Other | Attending: Cardiology

## 2022-05-26 DIAGNOSIS — I350 Nonrheumatic aortic (valve) stenosis: Secondary | ICD-10-CM | POA: Insufficient documentation

## 2022-05-26 LAB — ECHOCARDIOGRAM COMPLETE
AR max vel: 2.08 cm2
AV Peak grad: 12.5 mmHg
Ao pk vel: 1.77 m/s
Area-P 1/2: 3.16 cm2
Calc EF: 64.4 %
P 1/2 time: 3009 msec
S' Lateral: 2.83 cm
Single Plane A2C EF: 67 %
Single Plane A4C EF: 60 %

## 2022-05-27 DIAGNOSIS — M25571 Pain in right ankle and joints of right foot: Secondary | ICD-10-CM | POA: Diagnosis not present

## 2022-05-27 DIAGNOSIS — G629 Polyneuropathy, unspecified: Secondary | ICD-10-CM | POA: Diagnosis not present

## 2022-05-28 ENCOUNTER — Telehealth: Payer: Self-pay | Admitting: Cardiology

## 2022-05-28 ENCOUNTER — Telehealth: Payer: Self-pay | Admitting: *Deleted

## 2022-05-28 NOTE — Telephone Encounter (Signed)
Mychart message was sent by L. Ouida Sills, RN -   Satira Sark, MD  05/26/2022  2:56 PM EDT     Results reviewed.  LVEF remains normal at 60 to 65%.  Aortic valve is moderately calcified but sclerotic rather than stenotic.  Stable mild aortic regurgitation.  Continue with current follow-up plan.   Reviewed results with patient and she verbalized understanding.

## 2022-05-28 NOTE — Telephone Encounter (Signed)
Patient notified and verbalized understanding. 

## 2022-05-28 NOTE — Telephone Encounter (Signed)
Pt would ike a callback regarding Echo results. Please advise

## 2022-05-28 NOTE — Telephone Encounter (Signed)
Podiatrist would like to put patient on Gabapentin '300mg'$  daily for neuropathy.  Would like to know if Dr. Domenic Polite would be okay with this.

## 2022-06-28 ENCOUNTER — Ambulatory Visit: Payer: Medicare Other | Admitting: Cardiology

## 2022-07-12 ENCOUNTER — Encounter: Payer: Self-pay | Admitting: Family Medicine

## 2022-07-12 ENCOUNTER — Telehealth: Payer: Self-pay | Admitting: Family Medicine

## 2022-07-12 NOTE — Telephone Encounter (Signed)
Pt has new pt appt on 07/20/22 and states she is having burning in chest and throat from Gabapentin. Gabapentin was given by podiatrist. Pt is wanting to know what she needs to do. Pt states she has not taken the Gabapentin in 2 weeks. Please advise. Thank you.

## 2022-07-12 NOTE — Telephone Encounter (Signed)
Pt contacted and verbalized understanding.  

## 2022-07-14 ENCOUNTER — Telehealth: Payer: Self-pay | Admitting: Cardiology

## 2022-07-14 NOTE — Telephone Encounter (Signed)
*  STAT* If patient is at the pharmacy, call can be transferred to refill team.   1. Which medications need to be refilled? (please list name of each medication and dose if known) spironolactone (ALDACTONE) 25 MG tablet metoprolol tartrate (LOPRESSOR) 25 MG tablet  2. Which pharmacy/location (including street and city if local pharmacy) is medication to be sent to?  [place], [place]    3. Do they need a 30 day or 90 day supply? [place]

## 2022-07-14 NOTE — Telephone Encounter (Signed)
I spoke with pharmacist and he states patient has new order from October for aldactone and would refill for her.Patient aware.

## 2022-07-20 ENCOUNTER — Ambulatory Visit (INDEPENDENT_AMBULATORY_CARE_PROVIDER_SITE_OTHER): Payer: Medicare Other | Admitting: Family Medicine

## 2022-07-20 VITALS — BP 138/81 | Ht 63.0 in | Wt 188.6 lb

## 2022-07-20 DIAGNOSIS — I10 Essential (primary) hypertension: Secondary | ICD-10-CM | POA: Diagnosis not present

## 2022-07-20 DIAGNOSIS — K219 Gastro-esophageal reflux disease without esophagitis: Secondary | ICD-10-CM | POA: Diagnosis not present

## 2022-07-20 DIAGNOSIS — D582 Other hemoglobinopathies: Secondary | ICD-10-CM | POA: Diagnosis not present

## 2022-07-20 DIAGNOSIS — E785 Hyperlipidemia, unspecified: Secondary | ICD-10-CM

## 2022-07-20 DIAGNOSIS — G6289 Other specified polyneuropathies: Secondary | ICD-10-CM | POA: Diagnosis not present

## 2022-07-20 DIAGNOSIS — I358 Other nonrheumatic aortic valve disorders: Secondary | ICD-10-CM | POA: Insufficient documentation

## 2022-07-20 DIAGNOSIS — I351 Nonrheumatic aortic (valve) insufficiency: Secondary | ICD-10-CM | POA: Insufficient documentation

## 2022-07-20 DIAGNOSIS — G629 Polyneuropathy, unspecified: Secondary | ICD-10-CM | POA: Insufficient documentation

## 2022-07-20 DIAGNOSIS — R739 Hyperglycemia, unspecified: Secondary | ICD-10-CM | POA: Diagnosis not present

## 2022-07-20 MED ORDER — PREGABALIN 25 MG PO CAPS: 25.0000 mg | ORAL_CAPSULE | Freq: Two times a day (BID) | ORAL | 1 refills | Status: DC

## 2022-07-20 MED ORDER — PANTOPRAZOLE SODIUM 40 MG PO TBEC: 40.0000 mg | DELAYED_RELEASE_TABLET | Freq: Every day | ORAL | 1 refills | Status: DC

## 2022-07-20 NOTE — Assessment & Plan Note (Signed)
Hypertension is at goal on spironolactone and metoprolol.  Continue.

## 2022-07-20 NOTE — Assessment & Plan Note (Signed)
Starting on Lyrica.

## 2022-07-20 NOTE — Patient Instructions (Signed)
Medications as prescribed.  Labs today.  Follow up in 3 months.

## 2022-07-20 NOTE — Progress Notes (Signed)
Subjective:  Patient ID: [name], female    DOB: 11/11/1945  Age: 76 y.o. MRN: [id number]  CC: Chief Complaint  Patient presents with   Establish Care    Would like to discuss neuropathy in legs and feet Gabapentin 383m caused strange sensation in throat and stopped it    HPI:  76year old female with the below mentioned medical problems presents to establish care.  Patient's blood pressure is well-controlled on metoprolol and spironolactone.  Patient reports that she has ongoing issues with neuropathy.  She has previously taken gabapentin without difficulty but she recently restarted this and developed burning sensation in her chest and throat.  She has recently been taking a supplement for neuropathy as well.  She has stopped the gabapentin and her symptoms continue to persist.  She states that it seems to be worse in the morning and improves throughout the day.  She does not feel that it is made any better or worse by food intake.  No reports of belching.  No significant abdominal pain.  Patient is quite troubled by her neuropathy.  Affects the feet.  Since she is not taking gabapentin she would like something else as this causes her significant discomfort.  Patient Active Problem List   Diagnosis Date Noted   Aortic valve sclerosis 07/20/2022   Aortic regurgitation 07/20/2022   Peripheral neuropathy 07/20/2022   GERD (gastroesophageal reflux disease) 07/20/2022   Essential hypertension 10/25/2018   DDD (degenerative disc disease), lumbosacral 01/11/2013   S/P total knee replacement 09/15/2011    Social Hx   Social History   Socioeconomic History   Marital status: Widowed    Spouse name: Not on file   Number of children: 1   Years of education: Not on file   Highest education level: Not on file  Occupational History   Not on file  Tobacco Use   Smoking status: Never   Smokeless tobacco: Never  Vaping Use   Vaping Use: Never used  Substance and Sexual  Activity   Alcohol use: No   Drug use: No   Sexual activity: Not on file  Other Topics Concern   Not on file  Social History Narrative   Not on file   Social Determinants of Health   Financial Resource Strain: Not on file  Food Insecurity: Not on file  Transportation Needs: Not on file  Physical Activity: Not on file  Stress: Not on file  Social Connections: Not on file    Review of Systems Per HPI  Objective:  BP 138/81   Ht _0  (1.6 m)   Wt 188 lb 9.6 oz (85.5 kg)   BMI 33.41 kg/m      07/20/2022   10:02 AM 05/25/2022    9:30 AM 11/23/2021    1:05 PM  BP/Weight  Systolic BP [id number] Diastolic BP 81 80 80  Wt. (Lbs) 188.6 183.6 178.6  BMI 33.41 kg/m2 31.51 kg/m2 30.66 kg/m2    Physical Exam Vitals and nursing note reviewed.  Constitutional:      General: She is not in acute distress.    Appearance: Normal appearance.  HENT:     Head: Normocephalic and atraumatic.  Eyes:     General:        Right eye: No discharge.        Left eye: No discharge.     Conjunctiva/sclera: Conjunctivae normal.  Cardiovascular:     Rate and Rhythm: Normal rate and regular rhythm.  Pulmonary:     Effort: Pulmonary effort is normal.     Breath sounds: Normal breath sounds. No wheezing, rhonchi or rales.  Neurological:     Mental Status: She is alert.  Psychiatric:        Mood and Affect: Mood normal.        Behavior: Behavior normal.     Lab Results  Component Value Date   WBC 7.9 11/13/2020   HGB 15.8 11/13/2020   HCT 46.1 11/13/2020   PLT 294 11/13/2020   GLUCOSE 118 (H) 09/09/2021   CHOL 201 (H) 11/13/2020   TRIG 64 11/13/2020   HDL 65 11/13/2020   LDLCALC 124 (H) 11/13/2020   ALT 19 10/13/2018   AST 24 10/13/2018   NA 140 09/09/2021   K 4.1 09/09/2021   CL 104 09/09/2021   CREATININE 0.80 09/09/2021   BUN 8 09/09/2021   CO2 20 09/09/2021   TSH 2.440 11/13/2020   INR 1.5 05/24/2008     Assessment & Plan:   Problem List Items Addressed This  Visit       Cardiovascular and Mediastinum   Essential hypertension - Primary    Hypertension is at goal on spironolactone and metoprolol.  Continue.      Relevant Orders   CMP14+EGFR     Digestive   GERD (gastroesophageal reflux disease)    I believe the patient's symptoms are related to GERD.  Starting on Protonix.  If continues to persist, will need referral to GI for endoscopy.      Relevant Medications   pantoprazole (PROTONIX) 40 MG tablet     Nervous and Auditory   Peripheral neuropathy    Starting on Lyrica.      Relevant Medications   pregabalin (LYRICA) 25 MG capsule   Other Relevant Orders   Vitamin B12   Other Visit Diagnoses     Hyperlipidemia, unspecified hyperlipidemia type       Relevant Orders   Lipid panel   Elevated hemoglobin (HCC)       Relevant Orders   CBC   Blood glucose elevated       Relevant Orders   Hemoglobin A1c       Meds ordered this encounter  Medications   pantoprazole (PROTONIX) 40 MG tablet    Sig: Take 1 tablet (40 mg total) by mouth daily.    Dispense:  90 tablet    Refill:  1   pregabalin (LYRICA) 25 MG capsule    Sig: Take 1 capsule (25 mg total) by mouth 2 (two) times daily.    Dispense:  60 capsule    Refill:  1    Follow-up:  Return in about 3 months (around 10/20/2022).  [place]

## 2022-07-20 NOTE — Assessment & Plan Note (Signed)
I believe the patient's symptoms are related to GERD.  Starting on Protonix.  If continues to persist, will need referral to GI for endoscopy.

## 2022-07-21 LAB — CMP14+EGFR
ALT: 15 IU/L (ref 0–32)
AST: 19 IU/L (ref 0–40)
Albumin/Globulin Ratio: 2 (ref 1.2–2.2)
Albumin: 4.7 g/dL (ref 3.8–4.8)
Alkaline Phosphatase: 74 IU/L (ref 44–121)
BUN/Creatinine Ratio: 18 (ref 12–28)
BUN: 15 mg/dL (ref 8–27)
Bilirubin Total: 0.9 mg/dL (ref 0.0–1.2)
CO2: 23 mmol/L (ref 20–29)
Calcium: 10.3 mg/dL (ref 8.7–10.3)
Chloride: 102 mmol/L (ref 96–106)
Creatinine, Ser: 0.84 mg/dL (ref 0.57–1.00)
Globulin, Total: 2.4 g/dL (ref 1.5–4.5)
Glucose: 118 mg/dL — ABNORMAL HIGH (ref 70–99)
Potassium: 4.6 mmol/L (ref 3.5–5.2)
Sodium: 141 mmol/L (ref 134–144)
Total Protein: 7.1 g/dL (ref 6.0–8.5)
eGFR: 73 mL/min/{1.73_m2} (ref >59)

## 2022-07-21 LAB — LIPID PANEL
Chol/HDL Ratio: 3.3 ratio (ref 0.0–4.4)
Cholesterol, Total: 203 mg/dL — ABNORMAL HIGH (ref 100–199)
HDL: 62 mg/dL (ref >39)
LDL Chol Calc (NIH): 130 mg/dL — ABNORMAL HIGH (ref 0–99)
Triglycerides: 61 mg/dL (ref 0–149)
VLDL Cholesterol Cal: 11 mg/dL (ref 5–40)

## 2022-07-21 LAB — HEMOGLOBIN A1C
Est. average glucose Bld gHb Est-mCnc: 120 mg/dL
Hgb A1c MFr Bld: 5.8 % — ABNORMAL HIGH (ref 4.8–5.6)

## 2022-07-21 LAB — CBC
Hematocrit: 49.5 % — ABNORMAL HIGH (ref 34.0–46.6)
Hemoglobin: 16.7 g/dL — ABNORMAL HIGH (ref 11.1–15.9)
MCH: 31.9 pg (ref 26.6–33.0)
MCHC: 33.7 g/dL (ref 31.5–35.7)
MCV: 95 fL (ref 79–97)
Platelets: 240 10*3/uL (ref 150–450)
RBC: 5.23 x10E6/uL (ref 3.77–5.28)
RDW: 12 % (ref 11.7–15.4)
WBC: 8.2 10*3/uL (ref 3.4–10.8)

## 2022-07-21 LAB — VITAMIN B12: Vitamin B-12: 1353 pg/mL — ABNORMAL HIGH (ref 232–1245)

## 2022-07-22 ENCOUNTER — Other Ambulatory Visit: Payer: Self-pay | Admitting: Family Medicine

## 2022-07-22 ENCOUNTER — Other Ambulatory Visit: Payer: Self-pay

## 2022-07-22 DIAGNOSIS — D582 Other hemoglobinopathies: Secondary | ICD-10-CM

## 2022-07-22 MED ORDER — ATORVASTATIN CALCIUM 40 MG PO TABS: 40.0000 mg | ORAL_TABLET | Freq: Every day | ORAL | 3 refills | Status: DC

## 2022-07-26 ENCOUNTER — Telehealth: Payer: Self-pay

## 2022-07-26 ENCOUNTER — Encounter: Payer: Self-pay | Admitting: Family Medicine

## 2022-07-26 DIAGNOSIS — K219 Gastro-esophageal reflux disease without esophagitis: Secondary | ICD-10-CM

## 2022-07-26 NOTE — Telephone Encounter (Signed)
Patient notified

## 2022-07-26 NOTE — Telephone Encounter (Signed)
Urgent referral placed. Left message to return call

## 2022-07-26 NOTE — Telephone Encounter (Signed)
Please advise. Thank you

## 2022-07-26 NOTE — Telephone Encounter (Signed)
Caller name: PRIYANA MCCAREY  On DPR?: Yes  Call back number: [phone] (mobile)  Provider they see: Coral Spikes, DO  Reason for call:Pt is not improving she has been on med for a week and she is still having burning in her throat and chest. She is wanting advice from Dr Lacinda Axon what she should do. She said that Dr Lacinda Axon mention that he might have to order a upper GI if he does she wants it in [place]

## 2022-07-28 ENCOUNTER — Telehealth: Payer: Self-pay | Admitting: *Deleted

## 2022-07-28 ENCOUNTER — Encounter: Payer: Self-pay | Admitting: Gastroenterology

## 2022-07-28 DIAGNOSIS — K219 Gastro-esophageal reflux disease without esophagitis: Secondary | ICD-10-CM

## 2022-07-28 NOTE — Telephone Encounter (Signed)
Patient left message on machine that she just got scheduled with GI in [place] for mid February and she feels like that is too far out and it wont be no point in doing it if se needs to wait that long   Please advise

## 2022-07-29 NOTE — Telephone Encounter (Signed)
Coral Spikes, DO   Can we do an urgent referral to GI [facility])?

## 2022-07-29 NOTE — Telephone Encounter (Signed)
Urgent referral ordered in [facility]. Patient notified.

## 2022-09-06 ENCOUNTER — Encounter: Payer: Self-pay | Admitting: Family Medicine

## 2022-09-10 ENCOUNTER — Other Ambulatory Visit: Payer: Self-pay | Admitting: Family Medicine

## 2022-09-17 ENCOUNTER — Ambulatory Visit (INDEPENDENT_AMBULATORY_CARE_PROVIDER_SITE_OTHER): Payer: Medicare Other

## 2022-09-17 VITALS — Ht 63.0 in | Wt 180.0 lb

## 2022-09-17 DIAGNOSIS — Z0000 Encounter for general adult medical examination without abnormal findings: Secondary | ICD-10-CM | POA: Diagnosis not present

## 2022-09-17 DIAGNOSIS — Z780 Asymptomatic menopausal state: Secondary | ICD-10-CM

## 2022-09-17 DIAGNOSIS — Z1231 Encounter for screening mammogram for malignant neoplasm of breast: Secondary | ICD-10-CM

## 2022-09-17 NOTE — Progress Notes (Signed)
Subjective:   [name] is a 77 y.o. female who presents for an Initial Medicare Annual Wellness Visit.  I connected with  [name] on 09/17/22 by a audio enabled telemedicine application and verified that I am speaking with the correct person using two identifiers.  Patient Location: Home  Provider Location: Office/Clinic  I discussed the limitations of evaluation and management by telemedicine. The patient expressed understanding and agreed to proceed.  Review of Systems     Cardiac Risk Factors include: advanced age (>66mn, >>66women);hypertension     Objective:    Today's Vitals   09/17/22 1314  Weight: 180 lb (81.6 kg)  Height: '5\' 3"'$  (1.6 m)   Body mass index is 31.89 kg/m.     09/17/2022    1:54 PM 10/13/2018    8:52 AM 09/30/2018    9:57 PM 05/25/2017    1:37 PM 02/09/2017   12:02 PM 11/28/2015    8:00 PM 11/20/2015   10:04 AM  Advanced Directives  Does Patient Have a Medical Advance Directive? Yes  Yes Yes No;Yes Yes Yes  Type of Advance Directive Living will;Healthcare Power of [place] will Healthcare Power of [place] will  Does patient want to make changes to medical advance directive? No - Patient declined  No - Patient declined      Copy of [place] Chart? No - copy requested   No - copy requested No - copy requested Yes Yes  Would patient like information on creating a medical advance directive?     No - Patient declined      Current Medications (verified) Outpatient Encounter Medications as of 09/17/2022  Medication Sig   atorvastatin (LIPITOR) 40 MG tablet Take 1 tablet (40 mg total) by mouth daily.   gabapentin (NEURONTIN) 100 MG capsule Take 100 mg by mouth 3 (three) times daily.   metoprolol tartrate (LOPRESSOR) 25 MG tablet Take 1 tablet (25 mg total)  by mouth 2 (two) times daily.   pantoprazole (PROTONIX) 40 MG tablet Take 1 tablet (40 mg total) by mouth daily.   pregabalin (LYRICA) 25 MG capsule TAKE 1 CAPSULE BY MOUTH TWICE DAILY.   spironolactone (ALDACTONE) 25 MG tablet Take 1 tablet (25 mg total) by mouth every morning.   TRAVATAN Z 0.004 % SOLN ophthalmic solution Place 1 drop into both eyes at bedtime.    No facility-administered encounter medications on file as of 09/17/2022.    Allergies (verified) Amlodipine, Sulfonamide derivatives, and Valsartan   History: Past Medical History:  Diagnosis Date   Anxiety    Aortic stenosis    Arthritis    Depression    Essential hypertension    Glaucoma    Gout    Hyperlipidemia    Palpitations    SHOULDER PAIN, LEFT 07/03/2008   Qualifier: Diagnosis of   By: HAline BrochureMD, SDorothyann Peng      Past Surgical History:  Procedure Laterality Date   APPENDECTOMY     JOINT REPLACEMENT     both knees 2009 and shoulder reconstruction 2006   KNEE ARTHROPLASTY Right    KNEE ARTHROPLASTY Left    LUMBAR LAMINECTOMY/DECOMPRESSION MICRODISCECTOMY N/A 11/28/2015   Procedure: LUMBAR LAMINECTOMY/ DECOMPRESSION MICRODISCECTOMY LUMBAR TWO-THREE, LUMBAR THREE-FOUR  ;  Surgeon: KAshok Pall MD;  [facility];  Service: Neurosurgery;  Laterality: N/A;   PARTIAL HYSTERECTOMY  SHOULDER SURGERY     reconstruction   TONSILECTOMY, ADENOIDECTOMY, BILATERAL MYRINGOTOMY AND TUBES     Family History  Problem Relation Age of Onset   Colon cancer Cousin    Heart attack Mother    Cancer Father    Diabetes Brother    Social History   Socioeconomic History   Marital status: Widowed    Spouse name: Not on file   Number of children: 1   Years of education: Not on file   Highest education level: Not on file  Occupational History   Not on file  Tobacco Use   Smoking status: Never   Smokeless tobacco: Never  Vaping Use   Vaping Use: Never used  Substance and Sexual Activity   Alcohol use: No    Drug use: No   Sexual activity: Not on file  Other Topics Concern   Not on file  Social History Narrative   Not on file   Social Determinants of Health   Financial Resource Strain: Low Risk  (09/17/2022)   Overall Financial Resource Strain (CARDIA)    Difficulty of Paying Living Expenses: Not hard at all  Food Insecurity: No Food Insecurity (09/17/2022)   Hunger Vital Sign    Worried About Running Out of Food in the Last Year: Never true    Ran Out of Food in the Last Year: Never true  Transportation Needs: No Transportation Needs (09/17/2022)   PRAPARE - Hydrologist (Medical): No    Lack of Transportation (Non-Medical): No  Physical Activity: Inactive (09/17/2022)   Exercise Vital Sign    Days of Exercise per Week: 0 days    Minutes of Exercise per Session: 0 min  Stress: No Stress Concern Present (09/17/2022)   [place]    Feeling of Stress : Not at all  Social Connections: Moderately Integrated (09/17/2022)   Social Connection and Isolation Panel [NHANES]    Frequency of Communication with Friends and Family: More than three times a week    Frequency of Social Gatherings with Friends and Family: Three times a week    Attends Religious Services: More than 4 times per year    Active Member of Clubs or Organizations: Yes    Attends Archivist Meetings: More than 4 times per year    Marital Status: Widowed    Tobacco Counseling Counseling given: Not Answered   Clinical Intake:  Pre-visit preparation completed: Yes  Pain : No/denies pain  Diabetes: No  How often do you need to have someone help you when you read instructions, pamphlets, or other written materials from your doctor or pharmacy?: 1 - Never  Diabetic?No   Interpreter Needed?: No  Information entered by :: Denman George LPN   Activities of Daily Living    09/17/2022    1:54 PM  In your present  state of health, do you have any difficulty performing the following activities:  Hearing? 0  Vision? 0  Difficulty concentrating or making decisions? 0  Walking or climbing stairs? 0  Dressing or bathing? 0  Doing errands, shopping? 0  Preparing Food and eating ? N  Using the Toilet? N  In the past six months, have you accidently leaked urine? N  Do you have problems with loss of bowel control? N  Managing your Medications? N  Managing your Finances? N  Housekeeping or managing your Housekeeping? N    Patient Care Team: Thersa Salt  G, DO as PCP - General (Family Medicine) Satira Sark, MD as PCP - Cardiology (Cardiology) Levy Sjogren, MD as Referring Physician (Dermatology) Mauri Pole, MD as Consulting Physician (Gastroenterology) Caprice Beaver, DPM as Consulting Physician (Podiatry)  Indicate any recent Medical Services you may have received from other than [facility] providers in the past year (date may be approximate).     Assessment:   This is a routine wellness examination for Kendrick.  Hearing/Vision screen Hearing Screening - Comments:: Denies hearing difficulties  Vision Screening - Comments:: up to date with routine eye exams with Dr. Jorja Loa    Dietary issues and exercise activities discussed: Current Exercise Habits: The patient does not participate in regular exercise at present   Goals Addressed             This Visit's Progress    DIET - EAT MORE FRUITS AND VEGETABLES       DIET - INCREASE WATER INTAKE         Depression Screen    09/17/2022    1:53 PM 07/20/2022   10:04 AM  PHQ 2/9 Scores  PHQ - 2 Score 0 0    Fall Risk    09/17/2022    1:51 PM 07/20/2022   10:04 AM  Fall Risk   Falls in the past year? 0 0  Number falls in past yr: 0   Injury with Fall? 0   Risk for fall due to :  No Fall Risks  Follow up Falls prevention discussed;Education provided;Falls evaluation completed Falls evaluation completed     FALL RISK PREVENTION PERTAINING TO THE HOME:  Any stairs in or around the home? No  If so, are there any without handrails? No  Home free of loose throw rugs in walkways, pet beds, electrical cords, etc? Yes  Adequate lighting in your home to reduce risk of falls? Yes   ASSISTIVE DEVICES UTILIZED TO PREVENT FALLS:  Life alert? No  Use of a cane, walker or w/c? No  Grab bars in the bathroom? Yes  Shower chair or bench in shower? No  Elevated toilet seat or a handicapped toilet? Yes   TIMED UP AND GO:  Was the test performed? No . Telephonic visit   Cognitive Function:        09/17/2022    1:55 PM  6CIT Screen  What Year? 0 points  What month? 0 points  What time? 0 points  Count back from 20 0 points  Months in reverse 0 points  Repeat phrase 0 points  Total Score 0 points    Immunizations  There is no immunization history on file for this patient.  TDAP status: Due, Education has been provided regarding the importance of this vaccine. Advised may receive this vaccine at local pharmacy or Health Dept. Aware to provide a copy of the vaccination record if obtained from local pharmacy or Health Dept. Verbalized acceptance and understanding.  Flu Vaccine status: Up to date  Pneumococcal vaccine status: Due, Education has been provided regarding the importance of this vaccine. Advised may receive this vaccine at local pharmacy or Health Dept. Aware to provide a copy of the vaccination record if obtained from local pharmacy or Health Dept. Verbalized acceptance and understanding.  Covid-19 vaccine status: Information provided on how to obtain vaccines.   Qualifies for Shingles Vaccine? Yes   Zostavax completed No   Shingrix Completed?: No.    Education has been provided regarding the importance of this vaccine. Patient has  been advised to call insurance company to determine out of pocket expense if they have not yet received this vaccine. Advised may also receive  vaccine at local pharmacy or Health Dept. Verbalized acceptance and understanding.  Screening Tests Health Maintenance  Topic Date Due   Hepatitis C Screening  Never done   DTaP/Tdap/Td (1 - Tdap) Never done   Zoster Vaccines- Shingrix (1 of 2) Never done   Pneumonia Vaccine 61+ Years old (1 - PCV) Never done   DEXA SCAN  Never done   INFLUENZA VACCINE  11/21/2022 (Originally 03/23/2022)   MAMMOGRAM  01/23/2023   Medicare Annual Wellness (AWV)  09/18/2023   COLONOSCOPY (Pts 45-13yr Insurance coverage will need to be confirmed)  06/09/2027   HPV VACCINES  Aged Out   COVID-19 Vaccine  Discontinued    Health Maintenance  Health Maintenance Due  Topic Date Due   Hepatitis C Screening  Never done   DTaP/Tdap/Td (1 - Tdap) Never done   Zoster Vaccines- Shingrix (1 of 2) Never done   Pneumonia Vaccine 75 Years old (1 - PCV) Never done   DEXA SCAN  Never done    Colorectal cancer screening: Type of screening: Colonoscopy. Completed 06/08/17. Repeat every 10 years  Mammogram status: Completed 01/22/21. Repeat every year [facility] Ordered today. Pt provided with contact info and advised to call to schedule appt.  Lung Cancer Screening: (Low Dose CT Chest recommended if Age 75-80years, 30 pack-year currently smoking OR have quit w/in 15years.) does not qualify.   Lung Cancer Screening Referral: n/a   Additional Screening:  Hepatitis C Screening: does qualify; Completed at next office visit   Vision Screening: Recommended annual ophthalmology exams for early detection of glaucoma and other disorders of the eye. Is the patient up to date with their annual eye exam?  Yes  Who is the provider or what is the name of the office in which the patient attends annual eye exams? Dr. CJorja LoaIf pt is not established with a provider, would they like to be referred to a provider to establish care? No .   Dental Screening: Recommended annual  dental exams for proper oral hygiene  Community Resource Referral / Chronic Care Management: CRR required this visit?  No   CCM required this visit?  No      Plan:     I have personally reviewed and noted the following in the patient's chart:   Medical and social history Use of alcohol, tobacco or illicit drugs  Current medications and supplements including opioid prescriptions. Patient is not currently taking opioid prescriptions. Functional ability and status Nutritional status Physical activity Advanced directives List of other physicians Hospitalizations, surgeries, and ER visits in previous 12 months Vitals Screenings to include cognitive, depression, and falls Referrals and appointments  In addition, I have reviewed and discussed with patient certain preventive protocols, quality metrics, and best practice recommendations. A written personalized care plan for preventive services as well as general preventive health recommendations were provided to patient.     SVanetta Mulders [place]  19/15/0569  Due to this being a virtual visit, the after visit summary with patients personalized plan was offered to patient via mail or my-chart. Patient would like to access on my-chart  Nurse Notes: Patient states that she continues to have burning pain in chest.  Requested that follow up appointment be moved up.

## 2022-09-17 NOTE — Patient Instructions (Signed)
[name] , Thank you for taking time to come for your Medicare Wellness Visit. I appreciate your ongoing commitment to your health goals. Please review the following plan we discussed and let me know if I can assist you in the future.   These are the goals we discussed:  Goals   None     This is a list of the screening recommended for you and due dates:  Health Maintenance  Topic Date Due   Hepatitis C Screening: USPSTF Recommendation to screen - Ages 75-79 yo.  Never done   DTaP/Tdap/Td vaccine (1 - Tdap) Never done   Zoster (Shingles) Vaccine (1 of 2) Never done   Pneumonia Vaccine (1 - PCV) Never done   DEXA scan (bone density measurement)  Never done   Medicare Annual Wellness Visit  06/12/2021   Flu Shot  11/21/2022*   Mammogram  01/23/2023   Colon Cancer Screening  06/09/2027   HPV Vaccine  Aged Out   COVID-19 Vaccine  Discontinued  *Topic was postponed. The date shown is not the original due date.    Advanced directives: Please bring a copy of your health care power of attorney and/or living will to the office to be added to your chart at your convenience.   Conditions/risks identified: Aim for 30 minutes of exercise or brisk walking, 6-8 glasses of water, and 5 servings of fruits and vegetables each day.   Next appointment: Follow up in one year for your annual wellness visit    Preventive Care 65 Years and Older, Female Preventive care refers to lifestyle choices and visits with your health care provider that can promote health and wellness. What does preventive care include? A yearly physical exam. This is also called an annual well check. Dental exams once or twice a year. Routine eye exams. Ask your health care provider how often you should have your eyes checked. Personal lifestyle choices, including: Daily care of your teeth and gums. Regular physical activity. Eating a healthy diet. Avoiding tobacco and drug use. Limiting alcohol use. Practicing safe  sex. Taking low-dose aspirin every day. Taking vitamin and mineral supplements as recommended by your health care provider. What happens during an annual well check? The services and screenings done by your health care provider during your annual well check will depend on your age, overall health, lifestyle risk factors, and family history of disease. Counseling  Your health care provider may ask you questions about your: Alcohol use. Tobacco use. Drug use. Emotional well-being. Home and relationship well-being. Sexual activity. Eating habits. History of falls. Memory and ability to understand (cognition). Work and work Statistician. Reproductive health. Screening  You may have the following tests or measurements: Height, weight, and BMI. Blood pressure. Lipid and cholesterol levels. These may be checked every 5 years, or more frequently if you are over 72 years old. Skin check. Lung cancer screening. You may have this screening every year starting at age 39 if you have a 30-pack-year history of smoking and currently smoke or have quit within the past 15 years. Fecal occult blood test (FOBT) of the stool. You may have this test every year starting at age 39. Flexible sigmoidoscopy or colonoscopy. You may have a sigmoidoscopy every 5 years or a colonoscopy every 10 years starting at age 29. Hepatitis C blood test. Hepatitis B blood test. Sexually transmitted disease (STD) testing. Diabetes screening. This is done by checking your blood sugar (glucose) after you have not eaten for a while (fasting). You  may have this done every 1-3 years. Bone density scan. This is done to screen for osteoporosis. You may have this done starting at age 15. Mammogram. This may be done every 1-2 years. Talk to your health care provider about how often you should have regular mammograms. Talk with your health care provider about your test results, treatment options, and if necessary, the need for more  tests. Vaccines  Your health care provider may recommend certain vaccines, such as: Influenza vaccine. This is recommended every year. Tetanus, diphtheria, and acellular pertussis (Tdap, Td) vaccine. You may need a Td booster every 10 years. Zoster vaccine. You may need this after age 73. Pneumococcal 13-valent conjugate (PCV13) vaccine. One dose is recommended after age 84. Pneumococcal polysaccharide (PPSV23) vaccine. One dose is recommended after age 24. Talk to your health care provider about which screenings and vaccines you need and how often you need them. This information is not intended to replace advice given to you by your health care provider. Make sure you discuss any questions you have with your health care provider. Document Released: 09/05/2015 Document Revised: 04/28/2016 Document Reviewed: 06/10/2015 Elsevier Interactive Patient Education  2017 [place] Prevention in the Home Falls can cause injuries. They can happen to people of all ages. There are many things you can do to make your home safe and to help prevent falls. What can I do on the outside of my home? Regularly fix the edges of walkways and driveways and fix any cracks. Remove anything that might make you trip as you walk through a door, such as a raised step or threshold. Trim any bushes or trees on the path to your home. Use bright outdoor lighting. Clear any walking paths of anything that might make someone trip, such as rocks or tools. Regularly check to see if handrails are loose or broken. Make sure that both sides of any steps have handrails. Any raised decks and porches should have guardrails on the edges. Have any leaves, snow, or ice cleared regularly. Use sand or salt on walking paths during winter. Clean up any spills in your garage right away. This includes oil or grease spills. What can I do in the bathroom? Use night lights. Install grab bars by the toilet and in the tub and shower.  Do not use towel bars as grab bars. Use non-skid mats or decals in the tub or shower. If you need to sit down in the shower, use a plastic, non-slip stool. Keep the floor dry. Clean up any water that spills on the floor as soon as it happens. Remove soap buildup in the tub or shower regularly. Attach bath mats securely with double-sided non-slip rug tape. Do not have throw rugs and other things on the floor that can make you trip. What can I do in the bedroom? Use night lights. Make sure that you have a light by your bed that is easy to reach. Do not use any sheets or blankets that are too big for your bed. They should not hang down onto the floor. Have a firm chair that has side arms. You can use this for support while you get dressed. Do not have throw rugs and other things on the floor that can make you trip. What can I do in the kitchen? Clean up any spills right away. Avoid walking on wet floors. Keep items that you use a lot in easy-to-reach places. If you need to reach something above you, use a strong  step stool that has a grab bar. Keep electrical cords out of the way. Do not use floor polish or wax that makes floors slippery. If you must use wax, use non-skid floor wax. Do not have throw rugs and other things on the floor that can make you trip. What can I do with my stairs? Do not leave any items on the stairs. Make sure that there are handrails on both sides of the stairs and use them. Fix handrails that are broken or loose. Make sure that handrails are as long as the stairways. Check any carpeting to make sure that it is firmly attached to the stairs. Fix any carpet that is loose or worn. Avoid having throw rugs at the top or bottom of the stairs. If you do have throw rugs, attach them to the floor with carpet tape. Make sure that you have a light switch at the top of the stairs and the bottom of the stairs. If you do not have them, ask someone to add them for you. What else  can I do to help prevent falls? Wear shoes that: Do not have high heels. Have rubber bottoms. Are comfortable and fit you well. Are closed at the toe. Do not wear sandals. If you use a stepladder: Make sure that it is fully opened. Do not climb a closed stepladder. Make sure that both sides of the stepladder are locked into place. Ask someone to hold it for you, if possible. Clearly mark and make sure that you can see: Any grab bars or handrails. First and last steps. Where the edge of each step is. Use tools that help you move around (mobility aids) if they are needed. These include: Canes. Walkers. Scooters. Crutches. Turn on the lights when you go into a dark area. Replace any light bulbs as soon as they burn out. Set up your furniture so you have a clear path. Avoid moving your furniture around. If any of your floors are uneven, fix them. If there are any pets around you, be aware of where they are. Review your medicines with your doctor. Some medicines can make you feel dizzy. This can increase your chance of falling. Ask your doctor what other things that you can do to help prevent falls. This information is not intended to replace advice given to you by your health care provider. Make sure you discuss any questions you have with your health care provider. Document Released: 06/05/2009 Document Revised: 01/15/2016 Document Reviewed: 09/13/2014 Elsevier Interactive Patient Education  2017 [employer].

## 2022-09-24 ENCOUNTER — Ambulatory Visit: Payer: Medicare Other | Admitting: Family Medicine

## 2022-10-04 ENCOUNTER — Ambulatory Visit (INDEPENDENT_AMBULATORY_CARE_PROVIDER_SITE_OTHER): Payer: Medicare Other | Admitting: Gastroenterology

## 2022-10-04 ENCOUNTER — Encounter: Payer: Self-pay | Admitting: Gastroenterology

## 2022-10-04 VITALS — BP 130/80 | HR 73 | Ht 63.0 in | Wt 181.0 lb

## 2022-10-04 DIAGNOSIS — K219 Gastro-esophageal reflux disease without esophagitis: Secondary | ICD-10-CM | POA: Diagnosis not present

## 2022-10-04 DIAGNOSIS — R12 Heartburn: Secondary | ICD-10-CM

## 2022-10-04 NOTE — Patient Instructions (Signed)
Continue pantoprazole 40 mg daily.   Patient advised to avoid spicy, acidic, citrus, chocolate, mints, fruit and fruit juices.  Limit the intake of caffeine, alcohol and Soda.  Don't exercise too soon after eating.  Don't lie down within 3-4 hours of eating.  Elevate the head of your bed.  You have been scheduled for an endoscopy. Please follow written instructions given to you at your visit today. If you use inhalers (even only as needed), please bring them with you on the day of your procedure.  The [facility] providers would like to encourage you to use [facility] to communicate with providers for non-urgent requests or questions.  Due to long hold times on the telephone, sending your provider a message by [facility] may be a faster and more efficient way to get a response.  Please allow 48 business hours for a response.  Please remember that this is for non-urgent requests.   Due to recent changes in healthcare laws, you may see the results of your imaging and laboratory studies on MyChart before your provider has had a chance to review them.  We understand that in some cases there may be results that are confusing or concerning to you. Not all laboratory results come back in the same time frame and the provider may be waiting for multiple results in order to interpret others.  Please give [place] 48 hours in order for your provider to thoroughly review all the results before contacting the office for clarification of your results.

## 2022-10-04 NOTE — Progress Notes (Signed)
[name]    [id number]    May 12, 1946  Primary Care Physician:Cook, Barnie Del, DO  Referring Physician: Coral Spikes, DO [place],  [place] [address]   Chief complaint:  GERD  HPI:  77 yr old very pleasant female here for evaluation of worsening GERD symptoms with burning sensation in the throat and epigastric discomfort.  She feels her symptoms have been worse in the past 2 to 3 weeks, slightly improved since she was started on pantoprazole by her PMD Denies any nausea, vomiting, dysphagia, odynophagia, change in bowel habits, rectal bleeding or melena.  No family history of stomach cancer or GI malignancy.  Colonoscopy June 08, 2017: Sigmoid diverticulosis, hemorrhoids otherwise unremarkable exam  Outpatient Encounter Medications as of 10/04/2022  Medication Sig   metoprolol tartrate (LOPRESSOR) 25 MG tablet Take 1 tablet (25 mg total) by mouth 2 (two) times daily.   pantoprazole (PROTONIX) 40 MG tablet Take 1 tablet (40 mg total) by mouth daily.   pregabalin (LYRICA) 25 MG capsule TAKE 1 CAPSULE BY MOUTH TWICE DAILY.   spironolactone (ALDACTONE) 25 MG tablet Take 1 tablet (25 mg total) by mouth every morning.   TRAVATAN Z 0.004 % SOLN ophthalmic solution Place 1 drop into both eyes at bedtime.    atorvastatin (LIPITOR) 40 MG tablet Take 1 tablet (40 mg total) by mouth daily. (Patient not taking: Reported on 10/04/2022)   gabapentin (NEURONTIN) 100 MG capsule Take 100 mg by mouth 3 (three) times daily. (Patient not taking: Reported on 10/04/2022)   No facility-administered encounter medications on file as of 10/04/2022.    Allergies as of 10/04/2022 - Review Complete 10/04/2022  Allergen Reaction Noted   Amlodipine Swelling 08/26/2021   Sulfonamide derivatives Itching and Swelling    Valsartan Cough 09/27/2019    Past Medical History:  Diagnosis Date   Anxiety    Aortic stenosis    Arthritis    Depression    Essential  hypertension    Glaucoma    Gout    Hyperlipidemia    Palpitations    SHOULDER PAIN, LEFT 07/03/2008   Qualifier: Diagnosis of   By: Aline Brochure MD, Dorothyann Peng        Past Surgical History:  Procedure Laterality Date   APPENDECTOMY     JOINT REPLACEMENT     both knees 2009 and shoulder reconstruction 2006   KNEE ARTHROPLASTY Right    KNEE ARTHROPLASTY Left    LUMBAR LAMINECTOMY/DECOMPRESSION MICRODISCECTOMY N/A 11/28/2015   Procedure: LUMBAR LAMINECTOMY/ DECOMPRESSION MICRODISCECTOMY LUMBAR TWO-THREE, LUMBAR THREE-FOUR  ;  Surgeon: Ashok Pall, MD;  Location: [facility];  Service: Neurosurgery;  Laterality: N/A;   PARTIAL HYSTERECTOMY     SHOULDER SURGERY     reconstruction   TONSILECTOMY, ADENOIDECTOMY, BILATERAL MYRINGOTOMY AND TUBES      Family History  Problem Relation Age of Onset   Heart attack Mother    Cancer Father    Diabetes Brother    Colon cancer Cousin    Liver disease Neg Hx    Esophageal cancer Neg Hx     Social History   Socioeconomic History   Marital status: Widowed    Spouse name: Not on file   Number of children: 1   Years of education: Not on file   Highest education level: Not on file  Occupational History   Occupation: retired  Tobacco Use   Smoking status: Never   Smokeless tobacco:  Never  Vaping Use   Vaping Use: Never used  Substance and Sexual Activity   Alcohol use: No   Drug use: No   Sexual activity: Not on file  Other Topics Concern   Not on file  Social History Narrative   Not on file   Social Determinants of Health   Financial Resource Strain: Low Risk  (09/17/2022)   Overall Financial Resource Strain (CARDIA)    Difficulty of Paying Living Expenses: Not hard at all  Food Insecurity: No Food Insecurity (09/17/2022)   Hunger Vital Sign    Worried About Running Out of Food in the Last Year: Never true    Ran Out of Food in the Last Year: Never true  Transportation Needs: No Transportation Needs (09/17/2022)   PRAPARE -  Hydrologist (Medical): No    Lack of Transportation (Non-Medical): No  Physical Activity: Inactive (09/17/2022)   Exercise Vital Sign    Days of Exercise per Week: 0 days    Minutes of Exercise per Session: 0 min  Stress: No Stress Concern Present (09/17/2022)   [place]    Feeling of Stress : Not at all  Social Connections: Moderately Integrated (09/17/2022)   Social Connection and Isolation Panel [NHANES]    Frequency of Communication with Friends and Family: More than three times a week    Frequency of Social Gatherings with Friends and Family: Three times a week    Attends Religious Services: More than 4 times per year    Active Member of Clubs or Organizations: Yes    Attends Archivist Meetings: More than 4 times per year    Marital Status: Widowed  Intimate Partner Violence: Not At Risk (09/17/2022)   Humiliation, Afraid, Rape, and Kick questionnaire    Fear of Current or Ex-Partner: No    Emotionally Abused: No    Physically Abused: No    Sexually Abused: No      Review of systems: All other review of systems negative except as mentioned in the HPI.   Physical Exam: Vitals:   10/04/22 1402  BP: 130/80  Pulse: 73  SpO2: 95%   Body mass index is 32.06 kg/m. Gen:      No acute distress HEENT: Sclera hemorrhagic appearing with redness in the left eye, right eye appears normal, normal ocular movements bilaterally Abd:      soft, non-tender; no palpable masses, no distension Ext:    No edema Neuro: alert and oriented x 3 Psych: normal mood and affect  Data Reviewed:  Reviewed labs, radiology imaging, old records and pertinent past GI work up   Assessment and Plan/Recommendations:  77 year old female with history of hypertension, peripheral neuropathy, degenerative disc disease, with worsening heartburn and epigastric discomfort  Will plan for EGD to exclude  peptic ulcer disease, erosive esophagitis, erosive gastroduodenitis or any neoplastic lesion.  Will also need to exclude H. pylori related gastritis. The risks and benefits as well as alternatives of endoscopic procedure(s) have been discussed and reviewed. All questions answered. The patient agrees to proceed.  Continue pantoprazole 40 mg daily and antireflux measures  Left eye sclera appears hemorrhagic, advised patient to follow-up with her ophthalmologist as soon as possible.  The patient was provided an opportunity to ask questions and all were answered. The patient agreed with the plan and demonstrated an understanding of the instructions.  Damaris Hippo , MD    CC: Lacinda Axon,  Barnie Del, DO

## 2022-10-20 ENCOUNTER — Ambulatory Visit: Payer: Medicare Other | Admitting: Family Medicine

## 2022-10-21 ENCOUNTER — Encounter: Payer: Self-pay | Admitting: Radiology

## 2022-10-21 ENCOUNTER — Encounter: Payer: Self-pay | Admitting: Gastroenterology

## 2022-10-21 ENCOUNTER — Ambulatory Visit (AMBULATORY_SURGERY_CENTER): Payer: Medicare Other | Admitting: Gastroenterology

## 2022-10-21 VITALS — BP 126/63 | HR 66 | Temp 97.8°F | Resp 12 | Ht 63.0 in | Wt 181.0 lb

## 2022-10-21 DIAGNOSIS — K2970 Gastritis, unspecified, without bleeding: Secondary | ICD-10-CM

## 2022-10-21 DIAGNOSIS — K219 Gastro-esophageal reflux disease without esophagitis: Secondary | ICD-10-CM

## 2022-10-21 DIAGNOSIS — I10 Essential (primary) hypertension: Secondary | ICD-10-CM | POA: Diagnosis not present

## 2022-10-21 DIAGNOSIS — K2990 Gastroduodenitis, unspecified, without bleeding: Secondary | ICD-10-CM

## 2022-10-21 DIAGNOSIS — K3189 Other diseases of stomach and duodenum: Secondary | ICD-10-CM | POA: Diagnosis not present

## 2022-10-21 DIAGNOSIS — R12 Heartburn: Secondary | ICD-10-CM | POA: Diagnosis not present

## 2022-10-21 DIAGNOSIS — K2980 Duodenitis without bleeding: Secondary | ICD-10-CM

## 2022-10-21 DIAGNOSIS — I350 Nonrheumatic aortic (valve) stenosis: Secondary | ICD-10-CM | POA: Diagnosis not present

## 2022-10-21 MED ORDER — SODIUM CHLORIDE 0.9 % IV SOLN: 500.0000 mL | Freq: Once | INTRAVENOUS | Status: DC

## 2022-10-21 NOTE — Patient Instructions (Signed)
Please read handouts provided. Continue present medications. Await pathology results. Return to GI office in 3 months. Follow an antireflux regimen.   YOU HAD AN ENDOSCOPIC PROCEDURE TODAY AT [facility]:   Refer to the procedure report that was given to you for any specific questions about what was found during the examination.  If the procedure report does not answer your questions, please call your gastroenterologist to clarify.  If you requested that your care partner not be given the details of your procedure findings, then the procedure report has been included in a sealed envelope for you to review at your convenience later.  YOU SHOULD EXPECT: Some feelings of bloating in the abdomen. Passage of more gas than usual.  Walking can help get rid of the air that was put into your GI tract during the procedure and reduce the bloating. If you had a lower endoscopy (such as a colonoscopy or flexible sigmoidoscopy) you may notice spotting of blood in your stool or on the toilet paper. If you underwent a bowel prep for your procedure, you may not have a normal bowel movement for a few days.  Please Note:  You might notice some irritation and congestion in your nose or some drainage.  This is from the oxygen used during your procedure.  There is no need for concern and it should clear up in a day or so.  SYMPTOMS TO REPORT IMMEDIATELY:  Following upper endoscopy (EGD)  Vomiting of blood or coffee ground material  New chest pain or pain under the shoulder blades  Painful or persistently difficult swallowing  New shortness of breath  Fever of 100F or higher  Black, tarry-looking stools  For urgent or emergent issues, a gastroenterologist can be reached at any hour by calling [phone]. Do not use MyChart messaging for urgent concerns.    DIET:  We do recommend a small meal at first, but then you may proceed to your regular diet.  Drink plenty of fluids but you should  avoid alcoholic beverages for 24 hours.  ACTIVITY:  You should plan to take it easy for the rest of today and you should NOT DRIVE or use heavy machinery until tomorrow (because of the sedation medicines used during the test).    FOLLOW UP: Our staff will call the number listed on your records the next business day following your procedure.  We will call around 7:15- 8:00 am to check on you and address any questions or concerns that you may have regarding the information given to you following your procedure. If we do not reach you, we will leave a message.     If any biopsies were taken you will be contacted by phone or by letter within the next 1-3 weeks.  Please call us at [phone] if you have not heard about the biopsies in 3 weeks.    SIGNATURES/CONFIDENTIALITY: You and/or your care partner have signed paperwork which will be entered into your electronic medical record.  These signatures attest to the fact that that the information above on your After Visit Summary has been reviewed and is understood.  Full responsibility of the confidentiality of this discharge information lies with you and/or your care-partner.

## 2022-10-21 NOTE — Progress Notes (Signed)
Pt has small skin tear on R anterior FA distal to IV site.

## 2022-10-21 NOTE — Op Note (Signed)
[facility] Patient Name: [name] Procedure Date: 10/21/2022 3:46 PM MRN: [id number] Endoscopist: Mauri Pole , MD, [id number] Age: 77 Referring MD:  Date of Birth: [birth year] Gender: Female Account #: [id number] Procedure:                Upper GI endoscopy Indications:              Epigastric abdominal pain, Dyspepsia, Esophageal                            reflux symptoms that persist despite appropriate                            therapy Medicines:                Monitored Anesthesia Care Procedure:                Pre-Anesthesia Assessment:                           - Prior to the procedure, a History and Physical                            was performed, and patient medications and                            allergies were reviewed. The patient's tolerance of                            previous anesthesia was also reviewed. The risks                            and benefits of the procedure and the sedation                            options and risks were discussed with the patient.                            All questions were answered, and informed consent                            was obtained. Prior Anticoagulants: The patient has                            taken no anticoagulant or antiplatelet agents. ASA                            Grade Assessment: III - A patient with severe                            systemic disease. After reviewing the risks and                            benefits, the patient was deemed in satisfactory  condition to undergo the procedure.                           After obtaining informed consent, the endoscope was                            passed under direct vision. Throughout the                            procedure, the patient's blood pressure, pulse, and                            oxygen saturations were monitored continuously. The                            Olympus Scope [id number] was  introduced through the                            mouth, and advanced to the second part of duodenum.                            The upper GI endoscopy was accomplished without                            difficulty. The patient tolerated the procedure                            well. Scope In: Scope Out: Findings:                 The Z-line was regular and was found 38 cm from the                            incisors.                           The gastroesophageal flap valve was visualized                            endoscopically and classified as Hill Grade III                            (minimal fold, loose to endoscope, hiatal hernia                            likely).                           The exam of the esophagus was otherwise normal.                           Patchy mild inflammation characterized by                            congestion (edema), erythema and friability was  found in the entire examined stomach. Biopsies were                            taken with a cold forceps for Helicobacter pylori                            testing.                           The cardia and gastric fundus were normal on                            retroflexion.                           Patchy mildly erythematous mucosa was found in the                            first portion of the duodenum and in the second                            portion of the duodenum. Biopsies were taken with a                            cold forceps for histology. Biopsies for histology                            were taken with a cold forceps for evaluation of                            celiac disease. Complications:            No immediate complications. Estimated Blood Loss:     Estimated blood loss was minimal. Impression:               - Z-line regular, 38 cm from the incisors.                           - Gastroesophageal flap valve classified as Hill                             Grade III (minimal fold, loose to endoscope, hiatal                            hernia likely).                           - Gastritis. Biopsied.                           - Erythematous duodenopathy. Biopsied. Recommendation:           - Patient has a contact number available for                            emergencies. The signs and symptoms of potential  delayed complications were discussed with the                            patient. Return to normal activities tomorrow.                            Written discharge instructions were provided to the                            patient.                           - Resume previous diet.                           - Continue present medications.                           - Await pathology results.                           - Follow an antireflux regimen.                           - Return to GI office in 3 months. Mauri Pole, MD 10/21/2022 4:19:16 PM This report has been signed electronically.

## 2022-10-21 NOTE — Progress Notes (Signed)
Report given to PACU, vss 

## 2022-10-21 NOTE — Progress Notes (Signed)
Please refer to office visit note 10/04/22. No additional changes in H&P Patient is appropriate for planned procedure(s) and anesthesia in an ambulatory setting  K. Denzil Magnuson , MD [phone]

## 2022-10-21 NOTE — Progress Notes (Signed)
1605 Robinul 0.1 mg IV given due large amount of secretions upon assessment.  MD made aware, vss

## 2022-10-21 NOTE — Progress Notes (Signed)
Called to room to assist during endoscopic procedure.  Patient ID and intended procedure confirmed with present staff. Received instructions for my participation in the procedure from the performing physician.  

## 2022-10-22 ENCOUNTER — Telehealth: Payer: Self-pay

## 2022-10-22 NOTE — Telephone Encounter (Signed)
Unable to reach pt.  Left HIPAA compliant voicemail.

## 2022-10-25 ENCOUNTER — Encounter: Payer: Self-pay | Admitting: Gastroenterology

## 2022-11-04 ENCOUNTER — Encounter: Payer: Self-pay | Admitting: Gastroenterology

## 2022-11-05 ENCOUNTER — Telehealth: Payer: Self-pay | Admitting: Gastroenterology

## 2022-11-05 NOTE — Telephone Encounter (Signed)
Patient is calling seeking a phone call from the nurse to go over biopsy result. Please advise

## 2022-11-08 NOTE — Telephone Encounter (Signed)
Reviewed the provider's biopsy letter. Questions invited and answered. She reports some days she has breakthrough symptoms of her stomach "burning." Asks what she can do for this. Recommended OTC antiacids like TUMS, Gaviscon or famotidine.

## 2022-11-12 ENCOUNTER — Other Ambulatory Visit: Payer: Self-pay | Admitting: Cardiology

## 2022-11-12 ENCOUNTER — Telehealth: Payer: Self-pay | Admitting: Gastroenterology

## 2022-11-12 DIAGNOSIS — I10 Essential (primary) hypertension: Secondary | ICD-10-CM

## 2022-11-12 NOTE — Telephone Encounter (Signed)
Spoke with the patient. Encouraged her to ask her pharmacist about this. Patient said she fills her medications at [facility] and she will talk with them about this.

## 2022-11-12 NOTE — Telephone Encounter (Signed)
Inbound call from patient, is requesting to speak to a nurse in regards to medication. She is wondering if it is safe to take Pantoprazole and Gabapentin together at the same time.

## 2022-11-24 DIAGNOSIS — H04123 Dry eye syndrome of bilateral lacrimal glands: Secondary | ICD-10-CM | POA: Diagnosis not present

## 2022-12-28 ENCOUNTER — Telehealth: Payer: Self-pay | Admitting: Gastroenterology

## 2022-12-28 NOTE — Telephone Encounter (Signed)
Patient reports getting red spots on her skin in various places. They are a quarter  (.25 cents) in size. They feel hot. They do not itch. No history of psoriasis or eczema. She has never had this before. She will have a couple spots at a time. They will appear and disappear. Any relation to her GI issues or procedures?

## 2022-12-28 NOTE — Telephone Encounter (Signed)
Patient called stating after her endoscopy in 09/2022 she is now feeling "hot spots". She is requesting to speak about these symptoms. Please call on [phone]. Thank you.

## 2022-12-29 NOTE — Telephone Encounter (Signed)
Her last endoscopy was many months ago, unlikely to be related.  Please advise patient to call her PCP for evaluation of the new onset rash.  Thank you

## 2022-12-29 NOTE — Telephone Encounter (Signed)
Patient advised and agrees to this plan. 

## 2023-01-18 ENCOUNTER — Other Ambulatory Visit: Payer: Self-pay | Admitting: Family Medicine

## 2023-01-18 ENCOUNTER — Other Ambulatory Visit: Payer: Self-pay | Admitting: Cardiology

## 2023-01-18 DIAGNOSIS — I10 Essential (primary) hypertension: Secondary | ICD-10-CM

## 2023-01-18 DIAGNOSIS — R002 Palpitations: Secondary | ICD-10-CM

## 2023-01-31 ENCOUNTER — Ambulatory Visit: Payer: Medicare Other | Admitting: Nurse Practitioner

## 2023-02-01 ENCOUNTER — Ambulatory Visit (INDEPENDENT_AMBULATORY_CARE_PROVIDER_SITE_OTHER): Payer: Medicare Other | Admitting: Family Medicine

## 2023-02-01 VITALS — BP 138/86 | HR 66 | Temp 97.0°F | Ht 63.0 in | Wt 180.0 lb

## 2023-02-01 DIAGNOSIS — K219 Gastro-esophageal reflux disease without esophagitis: Secondary | ICD-10-CM | POA: Diagnosis not present

## 2023-02-01 DIAGNOSIS — F09 Unspecified mental disorder due to known physiological condition: Secondary | ICD-10-CM | POA: Diagnosis not present

## 2023-02-01 DIAGNOSIS — R413 Other amnesia: Secondary | ICD-10-CM

## 2023-02-01 MED ORDER — FAMOTIDINE 40 MG PO TABS: 40.0000 mg | ORAL_TABLET | Freq: Every day | ORAL | 4 refills | Status: DC

## 2023-02-01 NOTE — Progress Notes (Unsigned)
Subjective:    Patient ID: [name], female    DOB: 1946-01-15, 77 y.o.   MRN: [id number]  HPI Very nice patient comes in with several challenging issues Her grandson is with her  -Burning in throat down to chest for 2 months still lingering Still comes and goes for a few minutes.  She is not taking any medications currently.  She describes more of a burning in her throat and some upper esophageal burning.  Worse at various times in the afternoon and evening.  Does not wake her up at night.  She does states she tries to eat relatively healthy.  She denies any major setbacks recently.  Denies any dysphagia.  No weight loss.  No fever chills or sweats.  She has had previous workup including upper endoscopy in February 2024  -Stinging sensation inner right arm and other places going on and off for a few weeks-we decided that this issue would be tabled until next visit because of the pressing nature of the other problems  Stated prevagen for memory is there anything else that can be prescribed- misplacing items, short term memory Upon further discussion she relates she has had a progressive loss and short-term memory that has been present over the course of the past several months.  She relates that at times she is told things that she needs to remember and she forgets them relatively quickly.  She states that she does not do any long distance driving.  She typically likes to have family go with her to the store she done like to go by herself.  She denies doing anything unusual but does relate how she has misplaced things through the house and has difficult time finding them.  Her grandson relates how she moves her past with codes on a regular basis then they cannot find them.  They also relate how they will talk with her about various topics then less than 5 to 10 minutes later she is asking questions again regarding these topics.  She has not wandered she has not gotten  lost.     Review of Systems     Objective:   Physical Exam General-in no acute distress Eyes-no discharge Lungs-respiratory rate normal, CTA CV-no murmurs,RRR Extremities skin warm dry no edema Neuro grossly normal Behavior normal, alert Cognitively her instant recall is 3 for 3 after distraction with clock drawing her recall is 0 for 3 [place] cognitive assessment was completed      Assessment & Plan:  1. Short-term memory loss This is concerning Previous from 12 looks good This could easily be early Alzheimer's.  She does have cognitive dysfunction but does not have dementia.  I told her it would be okay for her to continue driving locally but not driving after dark.  Also recommended not to drive in highly congested areas or high traffic areas or high speed areas such as interstate.  She should not drive to [facility] neurology [name] she should let the family do that. It would be wise MRI of the brain to rule out mini strokes Lab work to look at thyroid function Patient to do a follow-up visit with Dr. Adriana Simas Patient is interested in trying medication to stabilize Unfortunately Namenda and Aricept do not do tremendous but seem reasonable to consider but first to do an MRI Hold off on neurology consult unlikely that they would offer any different opinion right at the moment - MR Brain Wo Contrast - TSH  2. Gastroesophageal  reflux disease, unspecified whether esophagitis present Patient states PPI for 1 reason or another did not really help her we did cover healthy eating with reflux minimizing caffeine's chocolate's tomato based products we also talked about utilizing bed on a 6 inch block to minimize reflux or sleeping on a wedge Pepcid prescribed follow-up in several weeks with Dr. Adriana Simas - famotidine (PEPCID) 40 MG tablet; Take 1 tablet (40 mg total) by mouth daily.  Dispense: 30 tablet; Refill: 4  Currently her grandson is helping her out The patient relates  how her own son misrepresented some legal forms that she signed and that caused her to lose part of her property.  I have informed the patient that she should try to retain a lawyer to get any legal papers/will etc. In line with what she would like to do Recommend the patient do a follow-up visit with Dr. Adriana Simas after the MRI

## 2023-02-01 NOTE — Patient Instructions (Signed)
Food Choices for Gastroesophageal Reflux Disease, Adult When you have gastroesophageal reflux disease (GERD), the foods you eat and your eating habits are very important. Choosing the right foods can help ease the discomfort of GERD. Consider working with a dietitian to help you make healthy food choices. What are tips for following this plan? Reading food labels Look for foods that are low in saturated fat. Foods that have less than 5% of daily value (DV) of fat and 0 g of trans fats may help with your symptoms. Cooking Cook foods using methods other than frying. This may include baking, steaming, grilling, or broiling. These are all methods that do not need a lot of fat for cooking. To add flavor, try to use herbs that are low in spice and acidity. Meal planning  Choose healthy foods that are low in fat, such as fruits, vegetables, whole grains, low-fat dairy products, lean meats, fish, and poultry. Eat frequent, small meals instead of three large meals each day. Eat your meals slowly, in a relaxed setting. Avoid bending over or lying down until 2-3 hours after eating. Limit high-fat foods such as fatty meats or fried foods. Limit your intake of fatty foods, such as oils, butter, and shortening. Avoid the following as told by your health care provider: Foods that cause symptoms. These may be different for different people. Keep a food diary to keep track of foods that cause symptoms. Alcohol. Drinking large amounts of liquid with meals. Eating meals during the 2-3 hours before bed. Lifestyle Maintain a healthy weight. Ask your health care provider what weight is healthy for you. If you need to lose weight, work with your health care provider to do so safely. Exercise for at least 30 minutes on 5 or more days each week, or as told by your health care provider. Avoid wearing clothes that fit tightly around your waist and chest. Do not use any products that contain nicotine or tobacco. These  products include cigarettes, chewing tobacco, and vaping devices, such as e-cigarettes. If you need help quitting, ask your health care provider. Sleep with the head of your bed raised. Use a wedge under the mattress or blocks under the bed frame to raise the head of the bed. Chew sugar-free gum after mealtimes. What foods should I eat?  Eat a healthy, well-balanced diet of fruits, vegetables, whole grains, low-fat dairy products, lean meats, fish, and poultry. Each person is different. Foods that may trigger symptoms in one person may not trigger any symptoms in another person. Work with your health care provider to identify foods that are safe for you. The items listed above may not be a complete list of recommended foods and beverages. Contact a dietitian for more information. What foods should I avoid? Limiting some of these foods may help manage the symptoms of GERD. Everyone is different. Consult a dietitian or your health care provider to help you identify the exact foods to avoid, if any. Fruits Any fruits prepared with added fat. Any fruits that cause symptoms. For some people this may include citrus fruits, such as oranges, grapefruit, pineapple, and lemons. Vegetables Deep-fried vegetables. French fries. Any vegetables prepared with added fat. Any vegetables that cause symptoms. For some people, this may include tomatoes and tomato products, chili peppers, onions and garlic, and horseradish. Grains Pastries or quick breads with added fat. Meats and other proteins High-fat meats, such as fatty beef or pork, hot dogs, ribs, ham, sausage, salami, and bacon. Fried meat or protein, including   fried fish and fried chicken. Nuts and nut butters, in large amounts. Dairy Whole milk and chocolate milk. Sour cream. Cream. Ice cream. Cream cheese. Milkshakes. Fats and oils Butter. Margarine. Shortening. Ghee. Beverages Coffee and tea, with or without caffeine. Carbonated beverages. Sodas. Energy  drinks. Fruit juice made with acidic fruits, such as orange or grapefruit. Tomato juice. Alcoholic drinks. Sweets and desserts Chocolate and cocoa. Donuts. Seasonings and condiments Pepper. Peppermint and spearmint. Added salt. Any condiments, herbs, or seasonings that cause symptoms. For some people, this may include curry, hot sauce, or vinegar-based salad dressings. The items listed above may not be a complete list of foods and beverages to avoid. Contact a dietitian for more information. Questions to ask your health care provider Diet and lifestyle changes are usually the first steps that are taken to manage symptoms of GERD. If diet and lifestyle changes do not improve your symptoms, talk with your health care provider about taking medicines. Where to find more information [employer] G[employer]intestinal [employer]: aboutgerd.org Summary When you have gastroesophageal reflux disease (GERD), food and lifestyle choices may be very helpful in easing the discomfort of GERD. Eat frequent, small meals instead of three large meals each day. Eat your meals slowly, in a relaxed setting. Avoid bending over or lying down until 2-3 hours after eating. Limit high-fat foods such as fatty meats or fried foods. This information is not intended to replace advice given to you by your health care provider. Make sure you discuss any questions you have with your health care provider. Document Revised: 02/18/2020 Document Reviewed: 02/18/2020 Elsevier Patient Education  2024 Elsevier Inc.  

## 2023-02-02 DIAGNOSIS — R413 Other amnesia: Secondary | ICD-10-CM | POA: Diagnosis not present

## 2023-02-04 ENCOUNTER — Telehealth: Payer: Self-pay | Admitting: Family Medicine

## 2023-02-04 LAB — TSH: TSH: 2.39 u[IU]/mL (ref 0.450–4.500)

## 2023-02-04 NOTE — Telephone Encounter (Signed)
Front I saw the patient earlier this week for several issues including forgetfulness short-term memory issues and we ordered lab work and MRI Her MRI is scheduled on July 8 She does need to do a follow-up office visit with Dr. Adriana Simas I would recommend that this office visit be no sooner than 5 days after July 8 so that family can discuss results with him and ongoing management can occur  Please connect with patient and schedule her somewhere in the window of July after July 13 with Dr. Adriana Simas thank you

## 2023-02-28 ENCOUNTER — Ambulatory Visit (HOSPITAL_COMMUNITY)
Admission: RE | Admit: 2023-02-28 | Discharge: 2023-02-28 | Disposition: A | Discharge status: Home / Self Care | Payer: Medicare Other | Source: Ambulatory Visit | Attending: Family Medicine | Admitting: Family Medicine

## 2023-02-28 DIAGNOSIS — I6782 Cerebral ischemia: Secondary | ICD-10-CM | POA: Diagnosis not present

## 2023-02-28 DIAGNOSIS — R413 Other amnesia: Secondary | ICD-10-CM | POA: Insufficient documentation

## 2023-02-28 DIAGNOSIS — G319 Degenerative disease of nervous system, unspecified: Secondary | ICD-10-CM | POA: Diagnosis not present

## 2023-03-02 ENCOUNTER — Ambulatory Visit: Payer: Medicare Other | Admitting: Gastroenterology

## 2023-03-10 ENCOUNTER — Other Ambulatory Visit: Payer: Self-pay

## 2023-03-10 DIAGNOSIS — R7303 Prediabetes: Secondary | ICD-10-CM

## 2023-03-10 DIAGNOSIS — E785 Hyperlipidemia, unspecified: Secondary | ICD-10-CM

## 2023-03-10 DIAGNOSIS — I639 Cerebral infarction, unspecified: Secondary | ICD-10-CM

## 2023-03-14 ENCOUNTER — Ambulatory Visit (INDEPENDENT_AMBULATORY_CARE_PROVIDER_SITE_OTHER): Payer: Medicare Other | Admitting: Family Medicine

## 2023-03-14 ENCOUNTER — Encounter: Payer: Self-pay | Admitting: Family Medicine

## 2023-03-14 VITALS — BP 135/80 | HR 65 | Temp 97.5°F | Ht 63.0 in | Wt 180.0 lb

## 2023-03-14 DIAGNOSIS — E785 Hyperlipidemia, unspecified: Secondary | ICD-10-CM | POA: Diagnosis not present

## 2023-03-14 DIAGNOSIS — G3184 Mild cognitive impairment, so stated: Secondary | ICD-10-CM | POA: Diagnosis not present

## 2023-03-14 DIAGNOSIS — E119 Type 2 diabetes mellitus without complications: Secondary | ICD-10-CM | POA: Diagnosis not present

## 2023-03-14 DIAGNOSIS — R9089 Other abnormal findings on diagnostic imaging of central nervous system: Secondary | ICD-10-CM | POA: Diagnosis not present

## 2023-03-14 DIAGNOSIS — I10 Essential (primary) hypertension: Secondary | ICD-10-CM | POA: Diagnosis not present

## 2023-03-14 DIAGNOSIS — R7303 Prediabetes: Secondary | ICD-10-CM | POA: Diagnosis not present

## 2023-03-14 MED ORDER — ASPIRIN 81 MG PO TBEC: 81.0000 mg | DELAYED_RELEASE_TABLET | Freq: Every day | ORAL | 3 refills | Status: AC

## 2023-03-14 NOTE — Patient Instructions (Signed)
Labs today.   Aspirin 81 mg daily.   Follow up in 3 months.

## 2023-03-15 DIAGNOSIS — G3184 Mild cognitive impairment, so stated: Secondary | ICD-10-CM | POA: Insufficient documentation

## 2023-03-15 DIAGNOSIS — E785 Hyperlipidemia, unspecified: Secondary | ICD-10-CM | POA: Insufficient documentation

## 2023-03-15 DIAGNOSIS — E119 Type 2 diabetes mellitus without complications: Secondary | ICD-10-CM | POA: Insufficient documentation

## 2023-03-15 DIAGNOSIS — R9089 Other abnormal findings on diagnostic imaging of central nervous system: Secondary | ICD-10-CM | POA: Insufficient documentation

## 2023-03-15 DIAGNOSIS — R7303 Prediabetes: Secondary | ICD-10-CM | POA: Insufficient documentation

## 2023-03-15 LAB — LIPID PANEL
Chol/HDL Ratio: 2.4 ratio (ref 0.0–4.4)
Cholesterol, Total: 142 mg/dL (ref 100–199)
HDL: 59 mg/dL (ref >39)
LDL Chol Calc (NIH): 68 mg/dL (ref 0–99)
Triglycerides: 80 mg/dL (ref 0–149)
VLDL Cholesterol Cal: 15 mg/dL (ref 5–40)

## 2023-03-15 LAB — HEMOGLOBIN A1C
Est. average glucose Bld gHb Est-mCnc: 146 mg/dL
Hgb A1c MFr Bld: 6.7 % — ABNORMAL HIGH (ref 4.8–5.6)

## 2023-03-15 MED ORDER — METFORMIN HCL 500 MG PO TABS: 500.0000 mg | ORAL_TABLET | Freq: Two times a day (BID) | ORAL | 3 refills | Status: DC

## 2023-03-15 NOTE — Assessment & Plan Note (Signed)
Stable.  Continue current medications.

## 2023-03-15 NOTE — Assessment & Plan Note (Signed)
Lipids at goal on Lipitor.  Continue.

## 2023-03-15 NOTE — Assessment & Plan Note (Signed)
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  Aggressive risk factor reduction.  Lipids at goal.  Blood pressure stable.  A1c returned elevated at 6.7 consistent with diabetes.  Starting metformin.  Offered referral to neurology.  Patient wants to wait at this time.

## 2023-03-15 NOTE — Assessment & Plan Note (Signed)
A1c returned at 6.7 consistent with type 2 diabetes.  Starting metformin.

## 2023-03-15 NOTE — Assessment & Plan Note (Signed)
Offered referral to neurology.  Patient wants to wait at this time.

## 2023-03-15 NOTE — Progress Notes (Signed)
Subjective:  Patient ID: [name], female    DOB: 28-Dec-1945  Age: 77 y.o. MRN: [id number]  CC: Chief Complaint  Patient presents with   MRI results    HPI:  77 year old female with aortic valve sclerosis, aortic regurgitation, neuropathy, GERD, hypertension, hyperlipidemia, prediabetes presents to discuss recent MRI.  Patient recently saw Dr. Gerda Diss while I was out on vacation.  She presented with multiple complaints but primary concern was memory issues.  Per Dr. Fletcher Anon note, she has reported short-term memory issues over the past several months.  MRI brain was obtained.  MRI revealed no acute intracranial abnormality.  Chronic small vessel ischemic changes noted.  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  Cerebral atrophy noted.  Will discuss MRI findings with the patient today.   Hypertension stable on spironolactone and metoprolol.  Patient compliant with Lipitor.  Needs lipid panel today to reassess given recent MRI findings.  Patient's last A1c was in the prediabetic range.  Needs A1c today.  Patient Active Problem List   Diagnosis Date Noted   Hyperlipidemia 03/15/2023   MCI (mild cognitive impairment) 03/15/2023   Abnormal brain MRI 03/15/2023   Diabetes mellitus without complication (HCC) 03/15/2023   Aortic valve sclerosis 07/20/2022   Aortic regurgitation 07/20/2022   Peripheral neuropathy 07/20/2022   GERD (gastroesophageal reflux disease) 07/20/2022   Essential hypertension 10/25/2018   DDD (degenerative disc disease), lumbosacral 01/11/2013   S/P total knee replacement 09/15/2011    Social Hx   Social History   Socioeconomic History   Marital status: Widowed    Spouse name: Not on file   Number of children: 1   Years of education: Not on file   Highest education level: Not on file  Occupational History   Occupation: retired  Tobacco Use   Smoking  status: Never   Smokeless tobacco: Never  Vaping Use   Vaping status: Never Used  Substance and Sexual Activity   Alcohol use: No   Drug use: No   Sexual activity: Not on file  Other Topics Concern   Not on file  Social History Narrative   Not on file   Social Determinants of Health   Financial Resource Strain: Low Risk  (09/17/2022)   Overall Financial Resource Strain (CARDIA)    Difficulty of Paying Living Expenses: Not hard at all  Food Insecurity: No Food Insecurity (09/17/2022)   Hunger Vital Sign    Worried About Running Out of Food in the Last Year: Never true    Ran Out of Food in the Last Year: Never true  Transportation Needs: No Transportation Needs (09/17/2022)   PRAPARE - Administrator, Civil Service (Medical): No    Lack of Transportation (Non-Medical): No  Physical Activity: Inactive (09/17/2022)   Exercise Vital Sign    Days of Exercise per Week: 0 days    Minutes of Exercise per Session: 0 min  Stress: No Stress Concern Present (09/17/2022)   [employer] of Occupational Health - Occupational Stress Questionnaire    Feeling of Stress : Not at all  Social Connections: Moderately Integrated (09/17/2022)   Social Connection and Isolation Panel [NHANES]    Frequency of Communication with Friends and Family: More than three times a week    Frequency of Social Gatherings with Friends and Family: Three times a week    Attends Religious Services: More than 4 times per  year    Active Member of Clubs or Organizations: Yes    Attends Banker Meetings: More than 4 times per year    Marital Status: Widowed    Review of Systems Per HPI  Objective:  BP 135/80   Pulse 65   Temp (!) 97.5 F (36.4 C)   Ht 5\' 3"  (1.6 m)   Wt 180 lb (81.6 kg)   SpO2 98%   BMI 31.89 kg/m      03/14/2023    3:02 PM 02/02/2023    3:41 PM 02/01/2023    3:44 PM  BP/Weight  Systolic BP 135 138 144  Diastolic BP 80 86 83  Wt. (Lbs) 180  180  BMI 31.89 kg/m2   31.89 kg/m2    Physical Exam Vitals and nursing note reviewed.  Constitutional:      General: She is not in acute distress.    Appearance: Normal appearance.  HENT:     Head: Normocephalic and atraumatic.  Eyes:     General:        Right eye: No discharge.        Left eye: No discharge.     Conjunctiva/sclera: Conjunctivae normal.  Cardiovascular:     Rate and Rhythm: Normal rate and regular rhythm.  Neurological:     General: No focal deficit present.     Mental Status: She is alert.  Psychiatric:        Mood and Affect: Mood normal.        Behavior: Behavior normal.     Lab Results  Component Value Date   WBC 8.2 07/20/2022   HGB 16.7 (H) 07/20/2022   HCT 49.5 (H) 07/20/2022   PLT 240 07/20/2022   GLUCOSE 118 (H) 07/20/2022   CHOL 142 03/14/2023   TRIG 80 03/14/2023   HDL 59 03/14/2023   LDLCALC 68 03/14/2023   ALT 15 07/20/2022   AST 19 07/20/2022   NA 141 07/20/2022   K 4.6 07/20/2022   CL 102 07/20/2022   CREATININE 0.84 07/20/2022   BUN 15 07/20/2022   CO2 23 07/20/2022   TSH 2.390 02/02/2023   INR 1.5 05/24/2008   HGBA1C 6.7 (H) 03/14/2023     Assessment & Plan:   Problem List Items Addressed This Visit       Cardiovascular and Mediastinum   Essential hypertension    Stable.  Continue current medications.      Relevant Medications   aspirin EC 81 MG tablet     Endocrine   Diabetes mellitus without complication (HCC)    A1c returned at 6.7 consistent with type 2 diabetes.  Starting metformin.      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  Aggressive risk factor reduction.  Lipids at goal.  Blood pressure stable.  A1c returned elevated at 6.7 consistent with diabetes.  Starting metformin.  Offered referral to neurology.  Patient wants  to wait at this time.      Hyperlipidemia    Lipids at goal on Lipitor.  Continue.      Relevant Medications   aspirin EC 81 MG tablet   Other Relevant Orders   Lipid panel (Completed)   MCI (mild cognitive impairment)    Offered  referral to neurology.  Patient wants to wait at this time.       Meds ordered this encounter  Medications   aspirin EC 81 MG tablet    Sig: Take 1 tablet (81 mg total) by mouth daily. Swallow whole.    Dispense:  90 tablet    Refill:  3   metFORMIN (GLUCOPHAGE) 500 MG tablet    Sig: Take 1 tablet (500 mg total) by mouth 2 (two) times daily with a meal.    Dispense:  180 tablet    Refill:  3    Follow-up:  Return in about 3 months (around 06/14/2023).  Everlene Other DO [facility]

## 2023-03-16 ENCOUNTER — Ambulatory Visit (HOSPITAL_COMMUNITY)
Admission: RE | Admit: 2023-03-16 | Discharge: 2023-03-16 | Disposition: A | Discharge status: Home / Self Care | Payer: Medicare Other | Source: Ambulatory Visit | Attending: Family Medicine | Admitting: Family Medicine

## 2023-03-16 DIAGNOSIS — I639 Cerebral infarction, unspecified: Secondary | ICD-10-CM | POA: Diagnosis not present

## 2023-03-16 DIAGNOSIS — I6523 Occlusion and stenosis of bilateral carotid arteries: Secondary | ICD-10-CM | POA: Diagnosis not present

## 2023-06-14 ENCOUNTER — Ambulatory Visit (INDEPENDENT_AMBULATORY_CARE_PROVIDER_SITE_OTHER): Payer: Medicare Other | Admitting: Family Medicine

## 2023-06-14 VITALS — BP 136/86 | HR 72 | Temp 97.4°F | Ht 63.0 in | Wt 180.2 lb

## 2023-06-14 DIAGNOSIS — K219 Gastro-esophageal reflux disease without esophagitis: Secondary | ICD-10-CM

## 2023-06-14 DIAGNOSIS — G3184 Mild cognitive impairment, so stated: Secondary | ICD-10-CM

## 2023-06-14 MED ORDER — ATORVASTATIN CALCIUM 40 MG PO TABS: 40.0000 mg | ORAL_TABLET | Freq: Every day | ORAL | 3 refills | Status: DC

## 2023-06-14 MED ORDER — PANTOPRAZOLE SODIUM 40 MG PO TBEC: 40.0000 mg | DELAYED_RELEASE_TABLET | Freq: Every day | ORAL | 1 refills | Status: DC

## 2023-06-14 NOTE — Patient Instructions (Signed)
Medication as prescribed.  Referral placed.  Follow up in 3 months.

## 2023-06-15 NOTE — Progress Notes (Unsigned)
Subjective:  Patient ID: [name], female    DOB: June 06, 1946  Age: 77 y.o. MRN: [id number]  CC:  Follow up   HPI:  Patient Active Problem List   Diagnosis Date Noted   Hyperlipidemia 03/15/2023   MCI (mild cognitive impairment) 03/15/2023   Abnormal brain MRI 03/15/2023   Diabetes mellitus without complication (HCC) 03/15/2023   Aortic valve sclerosis 07/20/2022   Aortic regurgitation 07/20/2022   Peripheral neuropathy 07/20/2022   GERD (gastroesophageal reflux disease) 07/20/2022   Essential hypertension 10/25/2018   DDD (degenerative disc disease), lumbosacral 01/11/2013   S/P total knee replacement 09/15/2011    Social Hx   Social History   Socioeconomic History   Marital status: Widowed    Spouse name: Not on file   Number of children: 1   Years of education: Not on file   Highest education level: Not on file  Occupational History   Occupation: retired  Tobacco Use   Smoking status: Never   Smokeless tobacco: Never  Vaping Use   Vaping status: Never Used  Substance and Sexual Activity   Alcohol use: No   Drug use: No   Sexual activity: Not on file  Other Topics Concern   Not on file  Social History Narrative   Not on file   Social Determinants of Health   Financial Resource Strain: Low Risk  (09/17/2022)   Overall Financial Resource Strain (CARDIA)    Difficulty of Paying Living Expenses: Not hard at all  Food Insecurity: No Food Insecurity (09/17/2022)   Hunger Vital Sign    Worried About Running Out of Food in the Last Year: Never true    Ran Out of Food in the Last Year: Never true  Transportation Needs: No Transportation Needs (09/17/2022)   PRAPARE - Administrator, Civil Service (Medical): No    Lack of Transportation (Non-Medical): No  Physical Activity: Inactive (09/17/2022)   Exercise Vital Sign    Days of Exercise per Week: 0 days    Minutes of Exercise per Session: 0 min  Stress: No Stress Concern Present (09/17/2022)    [employer] of Occupational Health - Occupational Stress Questionnaire    Feeling of Stress : Not at all  Social Connections: Moderately Integrated (09/17/2022)   Social Connection and Isolation Panel [NHANES]    Frequency of Communication with Friends and Family: More than three times a week    Frequency of Social Gatherings with Friends and Family: Three times a week    Attends Religious Services: More than 4 times per year    Active Member of Clubs or Organizations: Yes    Attends Banker Meetings: More than 4 times per year    Marital Status: Widowed    Review of Systems Per HPI  Objective:  BP 136/86   Pulse 72   Temp (!) 97.4 F (36.3 C)   Ht 5\' 3"  (1.6 m)   Wt 180 lb 3.2 oz (81.7 kg)   SpO2 97%   BMI 31.92 kg/m      06/14/2023    2:44 PM 03/14/2023    3:02 PM 02/02/2023    3:41 PM  BP/Weight  Systolic BP 136 135 138  Diastolic BP 86 80 86  Wt. (Lbs) 180.2 180   BMI 31.92 kg/m2 31.89 kg/m2     Physical Exam  Lab Results  Component Value Date   WBC 8.2 07/20/2022   HGB 16.7 (H) 07/20/2022   HCT 49.5 (H) 07/20/2022  PLT 240 07/20/2022   GLUCOSE 118 (H) 07/20/2022   CHOL 142 03/14/2023   TRIG 80 03/14/2023   HDL 59 03/14/2023   LDLCALC 68 03/14/2023   ALT 15 07/20/2022   AST 19 07/20/2022   NA 141 07/20/2022   K 4.6 07/20/2022   CL 102 07/20/2022   CREATININE 0.84 07/20/2022   BUN 15 07/20/2022   CO2 23 07/20/2022   TSH 2.390 02/02/2023   INR 1.5 05/24/2008   HGBA1C 6.7 (H) 03/14/2023     Assessment & Plan:   Problem List Items Addressed This Visit       Other   MCI (mild cognitive impairment) - Primary   Relevant Orders   Ambulatory referral to Neurology    Meds ordered this encounter  Medications   atorvastatin (LIPITOR) 40 MG tablet    Sig: Take 1 tablet (40 mg total) by mouth daily.    Dispense:  90 tablet    Refill:  3   pantoprazole (PROTONIX) 40 MG tablet    Sig: Take 1 tablet (40 mg total) by mouth  daily.    Dispense:  90 tablet    Refill:  1    Follow-up:  No follow-ups on file.  Everlene Other DO [facility]

## 2023-06-16 NOTE — Assessment & Plan Note (Signed)
Uncontrolled.  Stopping Pepcid.  Starting Protonix.

## 2023-06-16 NOTE — Assessment & Plan Note (Signed)
Patient's cognitive impairment likely progressing.  Concern for dementia.  After lengthy discussion today, she was amendable to referral.  Referring to neurology.

## 2023-06-24 ENCOUNTER — Other Ambulatory Visit: Payer: Self-pay | Admitting: Cardiology

## 2023-06-24 DIAGNOSIS — I10 Essential (primary) hypertension: Secondary | ICD-10-CM

## 2023-06-24 DIAGNOSIS — R002 Palpitations: Secondary | ICD-10-CM

## 2023-08-23 ENCOUNTER — Other Ambulatory Visit: Payer: Self-pay | Admitting: Cardiology

## 2023-08-23 DIAGNOSIS — I10 Essential (primary) hypertension: Secondary | ICD-10-CM

## 2023-09-12 ENCOUNTER — Other Ambulatory Visit: Payer: Self-pay | Admitting: Cardiology

## 2023-09-12 DIAGNOSIS — I10 Essential (primary) hypertension: Secondary | ICD-10-CM

## 2023-09-14 ENCOUNTER — Ambulatory Visit (INDEPENDENT_AMBULATORY_CARE_PROVIDER_SITE_OTHER): Payer: Medicare Other | Admitting: Family Medicine

## 2023-09-14 VITALS — BP 142/82 | HR 70 | Temp 97.2°F | Ht 63.0 in | Wt 179.0 lb

## 2023-09-14 DIAGNOSIS — E1169 Type 2 diabetes mellitus with other specified complication: Secondary | ICD-10-CM | POA: Diagnosis not present

## 2023-09-14 DIAGNOSIS — E119 Type 2 diabetes mellitus without complications: Secondary | ICD-10-CM | POA: Diagnosis not present

## 2023-09-14 DIAGNOSIS — I10 Essential (primary) hypertension: Secondary | ICD-10-CM

## 2023-09-14 DIAGNOSIS — G3184 Mild cognitive impairment, so stated: Secondary | ICD-10-CM

## 2023-09-14 DIAGNOSIS — E785 Hyperlipidemia, unspecified: Secondary | ICD-10-CM

## 2023-09-14 DIAGNOSIS — R002 Palpitations: Secondary | ICD-10-CM

## 2023-09-14 NOTE — Patient Instructions (Signed)
Labs today.   Continue your current medications  Follow up in 6 months.

## 2023-09-15 ENCOUNTER — Other Ambulatory Visit: Payer: Self-pay

## 2023-09-15 ENCOUNTER — Encounter: Payer: Self-pay | Admitting: Family Medicine

## 2023-09-15 LAB — CMP14+EGFR
ALT: 23 [IU]/L (ref 0–32)
AST: 19 [IU]/L (ref 0–40)
Albumin: 4.8 g/dL (ref 3.8–4.8)
Alkaline Phosphatase: 100 [IU]/L (ref 44–121)
BUN/Creatinine Ratio: 19 (ref 12–28)
BUN: 16 mg/dL (ref 8–27)
Bilirubin Total: 1 mg/dL (ref 0.0–1.2)
CO2: 25 mmol/L (ref 20–29)
Calcium: 11.1 mg/dL — ABNORMAL HIGH (ref 8.7–10.3)
Chloride: 97 mmol/L (ref 96–106)
Creatinine, Ser: 0.86 mg/dL (ref 0.57–1.00)
Globulin, Total: 2.4 g/dL (ref 1.5–4.5)
Glucose: 170 mg/dL — ABNORMAL HIGH (ref 70–99)
Potassium: 4.2 mmol/L (ref 3.5–5.2)
Sodium: 141 mmol/L (ref 134–144)
Total Protein: 7.2 g/dL (ref 6.0–8.5)
eGFR: 70 mL/min/{1.73_m2} (ref >59)

## 2023-09-15 LAB — MICROALBUMIN / CREATININE URINE RATIO
Creatinine, Urine: 59.9 mg/dL
Microalb/Creat Ratio: 8 mg/g{creat} (ref 0–29)
Microalbumin, Urine: 4.7 ug/mL

## 2023-09-15 LAB — HEMOGLOBIN A1C
Est. average glucose Bld gHb Est-mCnc: 137 mg/dL
Hgb A1c MFr Bld: 6.4 % — ABNORMAL HIGH (ref 4.8–5.6)

## 2023-09-15 MED ORDER — SPIRONOLACTONE 25 MG PO TABS: 25.0000 mg | ORAL_TABLET | Freq: Every morning | ORAL | 3 refills | Status: DC

## 2023-09-15 MED ORDER — METOPROLOL TARTRATE 25 MG PO TABS: 25.0000 mg | ORAL_TABLET | Freq: Two times a day (BID) | ORAL | 3 refills | Status: DC

## 2023-09-15 MED ORDER — METFORMIN HCL 500 MG PO TABS: 500.0000 mg | ORAL_TABLET | Freq: Two times a day (BID) | ORAL | 3 refills | Status: DC

## 2023-09-15 NOTE — Assessment & Plan Note (Signed)
Stable on Lipitor.  Continue. 

## 2023-09-15 NOTE — Assessment & Plan Note (Signed)
Encouraged upcoming appointment with neurology.  Needs formal cognitive assessment.

## 2023-09-15 NOTE — Assessment & Plan Note (Signed)
A1c obtained and was improved at 6.4.  Continue metformin.

## 2023-09-15 NOTE — Progress Notes (Signed)
Subjective:  Patient ID: [name], female    DOB: 11/10/1945  Age: 78 y.o. MRN: [id number]  CC: Follow-up   HPI:  78 year old female with the below mentioned medical problems presents for follow-up.  Patient is accompanied by her grandson today.  Also accompanied by grandson's girlfriend's mother.  She stays with her during the day.  Upcoming visit with neurology.  Needs formal cognitive assessment.  Son reports that she is quite forgetful.  Patient feels like she is doing okay.  Lucila Maine is a power of attorney per her report and his report.  There is a current legal issue within the family regarding property/ownership.  This involves the patient, grandson, and patient's son.  Fair control of hypertension.  She is on metoprolol and spironolactone.  Lipids at goal on atorvastatin 40 mg daily.  Metformin was recently started.  Patient tolerating.  Patient inquiring about whether she can have occasional ice cream.  I advised her that this is okay.  She can have some of the foods that she likes that are not so healthy in a reasonable amount.  Most recent A1c 6.7.  Patient Active Problem List   Diagnosis Date Noted   Hyperlipidemia 03/15/2023   MCI (mild cognitive impairment) 03/15/2023   Abnormal brain MRI 03/15/2023   Diabetes mellitus without complication (HCC) 03/15/2023   Aortic valve sclerosis 07/20/2022   Aortic regurgitation 07/20/2022   Peripheral neuropathy 07/20/2022   GERD (gastroesophageal reflux disease) 07/20/2022   Essential hypertension 10/25/2018   DDD (degenerative disc disease), lumbosacral 01/11/2013   S/P total knee replacement 09/15/2011    Social Hx   Social History   Socioeconomic History   Marital status: Widowed    Spouse name: Not on file   Number of children: 1   Years of education: Not on file   Highest education level: Not on file  Occupational History   Occupation: retired  Tobacco Use   Smoking status: Never   Smokeless  tobacco: Never  Vaping Use   Vaping status: Never Used  Substance and Sexual Activity   Alcohol use: No   Drug use: No   Sexual activity: Not on file  Other Topics Concern   Not on file  Social History Narrative   Not on file   Social Drivers of Health   Financial Resource Strain: Low Risk  (09/17/2022)   Overall Financial Resource Strain (CARDIA)    Difficulty of Paying Living Expenses: Not hard at all  Food Insecurity: No Food Insecurity (09/17/2022)   Hunger Vital Sign    Worried About Running Out of Food in the Last Year: Never true    Ran Out of Food in the Last Year: Never true  Transportation Needs: No Transportation Needs (09/17/2022)   PRAPARE - Administrator, Civil Service (Medical): No    Lack of Transportation (Non-Medical): No  Physical Activity: Inactive (09/17/2022)   Exercise Vital Sign    Days of Exercise per Week: 0 days    Minutes of Exercise per Session: 0 min  Stress: No Stress Concern Present (09/17/2022)   [employer] of Occupational Health - Occupational Stress Questionnaire    Feeling of Stress : Not at all  Social Connections: Moderately Integrated (09/17/2022)   Social Connection and Isolation Panel [NHANES]    Frequency of Communication with Friends and Family: More than three times a week    Frequency of Social Gatherings with Friends and Family: Three times a week    Attends Religious  Services: More than 4 times per year    Active Member of Clubs or Organizations: Yes    Attends Banker Meetings: More than 4 times per year    Marital Status: Widowed    Review of Systems Per HPI  Objective:  BP (!) 142/82   Pulse 70   Temp (!) 97.2 F (36.2 C)   Ht 5\' 3"  (1.6 m)   Wt 179 lb (81.2 kg)   SpO2 97%   BMI 31.71 kg/m      09/14/2023    3:20 PM 09/14/2023    2:41 PM 06/14/2023    2:44 PM  BP/Weight  Systolic BP 142 161 136  Diastolic BP 82 86 86  Wt. (Lbs)  179 180.2  BMI  31.71 kg/m2 31.92 kg/m2     Physical Exam Constitutional:      General: She is not in acute distress.    Appearance: Normal appearance.  HENT:     Head: Normocephalic and atraumatic.  Eyes:     General:        Right eye: No discharge.        Left eye: No discharge.     Conjunctiva/sclera: Conjunctivae normal.  Cardiovascular:     Rate and Rhythm: Normal rate and regular rhythm.     Heart sounds: Murmur heard.  Pulmonary:     Effort: Pulmonary effort is normal.     Breath sounds: Normal breath sounds. No wheezing, rhonchi or rales.  Neurological:     Mental Status: She is alert.  Psychiatric:        Mood and Affect: Mood normal.        Behavior: Behavior normal.     Lab Results  Component Value Date   WBC 8.2 07/20/2022   HGB 16.7 (H) 07/20/2022   HCT 49.5 (H) 07/20/2022   PLT 240 07/20/2022   GLUCOSE 170 (H) 09/14/2023   CHOL 142 03/14/2023   TRIG 80 03/14/2023   HDL 59 03/14/2023   LDLCALC 68 03/14/2023   ALT 23 09/14/2023   AST 19 09/14/2023   NA 141 09/14/2023   K 4.2 09/14/2023   CL 97 09/14/2023   CREATININE 0.86 09/14/2023   BUN 16 09/14/2023   CO2 25 09/14/2023   TSH 2.390 02/02/2023   INR 1.5 05/24/2008   HGBA1C 6.4 (H) 09/14/2023     Assessment & Plan:   Problem List Items Addressed This Visit       Cardiovascular and Mediastinum   Essential hypertension   BP fairly well-controlled given age.  Continue current occasions.      Relevant Medications   metoprolol tartrate (LOPRESSOR) 25 MG tablet   spironolactone (ALDACTONE) 25 MG tablet     Endocrine   Diabetes mellitus without complication (HCC) - Primary   A1c obtained and was improved at 6.4.  Continue metformin.      Relevant Medications   metFORMIN (GLUCOPHAGE) 500 MG tablet   Other Relevant Orders   CMP14+EGFR (Completed)   Hemoglobin A1c (Completed)   Microalbumin / creatinine urine ratio (Completed)     Other   MCI (mild cognitive impairment)   Encouraged upcoming appointment with neurology.   Needs formal cognitive assessment.      Hyperlipidemia   Stable on Lipitor.  Continue.      Relevant Medications   metoprolol tartrate (LOPRESSOR) 25 MG tablet   spironolactone (ALDACTONE) 25 MG tablet   Other Visit Diagnoses       Palpitations  Relevant Medications   spironolactone (ALDACTONE) 25 MG tablet       Meds ordered this encounter  Medications   metoprolol tartrate (LOPRESSOR) 25 MG tablet    Sig: Take 1 tablet (25 mg total) by mouth 2 (two) times daily.    Dispense:  180 tablet    Refill:  3   metFORMIN (GLUCOPHAGE) 500 MG tablet    Sig: Take 1 tablet (500 mg total) by mouth 2 (two) times daily with a meal.    Dispense:  180 tablet    Refill:  3   spironolactone (ALDACTONE) 25 MG tablet    Sig: Take 1 tablet (25 mg total) by mouth every morning.    Dispense:  90 tablet    Refill:  3    Follow-up:  Return in about 6 months (around 03/13/2024).  Everlene Other DO [facility]

## 2023-09-15 NOTE — Assessment & Plan Note (Signed)
BP fairly well-controlled given age.  Continue current occasions.

## 2023-09-23 ENCOUNTER — Ambulatory Visit (INDEPENDENT_AMBULATORY_CARE_PROVIDER_SITE_OTHER): Payer: Medicare Other

## 2023-09-23 VITALS — BP 142/82 | Ht 65.0 in | Wt 179.0 lb

## 2023-09-23 DIAGNOSIS — Z2821 Immunization not carried out because of patient refusal: Secondary | ICD-10-CM

## 2023-09-23 DIAGNOSIS — Z5320 Procedure and treatment not carried out because of patient's decision for unspecified reasons: Secondary | ICD-10-CM | POA: Diagnosis not present

## 2023-09-23 DIAGNOSIS — Z0000 Encounter for general adult medical examination without abnormal findings: Secondary | ICD-10-CM | POA: Diagnosis not present

## 2023-09-23 NOTE — Patient Instructions (Signed)
[name] , Thank you for taking time to come for your Medicare Wellness Visit. I appreciate your ongoing commitment to your health goals. Please review the following plan we discussed and let me know if I can assist you in the future.   Referrals/Orders/Follow-Ups/Clinician Recommendations:  Next Medicare Annual Wellness Visit: October 05, 2024 at 8:00 am telephone visit.   This is a list of the screening recommended for you and due dates:  Health Maintenance  Topic Date Due   Pneumonia Vaccine (1 of 2 - PCV) Never done   DTaP/Tdap/Td vaccine (1 - Tdap) Never done   Zoster (Shingles) Vaccine (1 of 2) Never done   DEXA scan (bone density measurement)  Never done   Mammogram  01/22/2022   Eye exam for diabetics  03/30/2023   Flu Shot  11/21/2023*   Hemoglobin A1C  03/13/2024   Yearly kidney function blood test for diabetes  09/13/2024   Yearly kidney health urinalysis for diabetes  09/13/2024   Complete foot exam   09/13/2024   Medicare Annual Wellness Visit  09/22/2024   Colon Cancer Screening  06/09/2027   HPV Vaccine  Aged Out   COVID-19 Vaccine  Discontinued   Hepatitis C Screening  Discontinued  *Topic was postponed. The date shown is not the original due date.    Advanced directives: (Declined) Advance directive discussed with you today. Even though you declined this today, please call our office should you change your mind, and we can give you the proper paperwork for you to fill out.  Next Medicare Annual Wellness Visit scheduled for next year: yes  Preventive Care 76 Years and Older, Female Preventive care refers to lifestyle choices and visits with your health care provider that can promote health and wellness. Preventive care visits are also called wellness exams. What can I expect for my preventive care visit? Counseling Your health care provider may ask you questions about your: Medical history, including: Past medical problems. Family medical  history. Pregnancy and menstrual history. History of falls. Current health, including: Memory and ability to understand (cognition). Emotional well-being. Home life and relationship well-being. Sexual activity and sexual health. Lifestyle, including: Alcohol, nicotine or tobacco, and drug use. Access to firearms. Diet, exercise, and sleep habits. Work and work Astronomer. Sunscreen use. Safety issues such as seatbelt and bike helmet use. Physical exam Your health care provider will check your: Height and weight. These may be used to calculate your BMI (body mass index). BMI is a measurement that tells if you are at a healthy weight. Waist circumference. This measures the distance around your waistline. This measurement also tells if you are at a healthy weight and may help predict your risk of certain diseases, such as type 2 diabetes and high blood pressure. Heart rate and blood pressure. Body temperature. Skin for abnormal spots. What immunizations do I need?  Vaccines are usually given at various ages, according to a schedule. Your health care provider will recommend vaccines for you based on your age, medical history, and lifestyle or other factors, such as travel or where you work. What tests do I need? Screening Your health care provider may recommend screening tests for certain conditions. This may include: Lipid and cholesterol levels. Hepatitis C test. Hepatitis B test. HIV (human immunodeficiency virus) test. STI (sexually transmitted infection) testing, if you are at risk. Lung cancer screening. Colorectal cancer screening. Diabetes screening. This is done by checking your blood sugar (glucose) after you have not eaten for a while (  fasting). Mammogram. Talk with your health care provider about how often you should have regular mammograms. BRCA-related cancer screening. This may be done if you have a family history of breast, ovarian, tubal, or peritoneal  cancers. Bone density scan. This is done to screen for osteoporosis. Talk with your health care provider about your test results, treatment options, and if necessary, the need for more tests. Follow these instructions at home: Eating and drinking  Eat a diet that includes fresh fruits and vegetables, whole grains, lean protein, and low-fat dairy products. Limit your intake of foods with high amounts of sugar, saturated fats, and salt. Take vitamin and mineral supplements as recommended by your health care provider. Do not drink alcohol if your health care provider tells you not to drink. If you drink alcohol: Limit how much you have to 0-1 drink a day. Know how much alcohol is in your drink. In the [place] one drink equals one 12 oz bottle of beer (355 mL), one 5 oz glass of wine (148 mL), or one 1 oz glass of hard liquor (44 mL). Lifestyle Brush your teeth every morning and night with fluoride toothpaste. Floss one time each day. Exercise for at least 30 minutes 5 or more days each week. Do not use any products that contain nicotine or tobacco. These products include cigarettes, chewing tobacco, and vaping devices, such as e-cigarettes. If you need help quitting, ask your health care provider. Do not use drugs. If you are sexually active, practice safe sex. Use a condom or other form of protection in order to prevent STIs. Take aspirin only as told by your health care provider. Make sure that you understand how much to take and what form to take. Work with your health care provider to find out whether it is safe and beneficial for you to take aspirin daily. Ask your health care provider if you need to take a cholesterol-lowering medicine (statin). Find healthy ways to manage stress, such as: Meditation, yoga, or listening to music. Journaling. Talking to a trusted person. Spending time with friends and family. Minimize exposure to UV radiation to reduce your risk of skin  cancer. Safety Always wear your seat belt while driving or riding in a vehicle. Do not drive: If you have been drinking alcohol. Do not ride with someone who has been drinking. When you are tired or distracted. While texting. If you have been using any mind-altering substances or drugs. Wear a helmet and other protective equipment during sports activities. If you have firearms in your house, make sure you follow all gun safety procedures. What's next? Visit your health care provider once a year for an annual wellness visit. Ask your health care provider how often you should have your eyes and teeth checked. Stay up to date on all vaccines. This information is not intended to replace advice given to you by your health care provider. Make sure you discuss any questions you have with your health care provider. Document Revised: 02/04/2021 Document Reviewed: 02/04/2021 Elsevier Patient Education  2024 [employer].  Understanding Your Risk for Falls Millions of people have serious injuries from falls each year. It is important to understand your risk of falling. Talk with your health care provider about your risk and what you can do to lower it. If you do have a serious fall, make sure to tell your provider. Falling once raises your risk of falling again. How can falls affect me? Serious injuries from falls are common. These include: Broken  bones, such as hip fractures. Head injuries, such as traumatic brain injuries (TBI) or concussions. A fear of falling can cause you to avoid activities and stay at home. This can make your muscles weaker and raise your risk for a fall. What can increase my risk? There are a number of risk factors that increase your risk for falling. The more risk factors you have, the higher your risk of falling. Serious injuries from a fall happen most often to people who are older than 76 years old. Teenagers and young adults ages 31-29 are also at higher risk. Common  risk factors include: Weakness in the lower body. Being generally weak or confused due to long-term (chronic) illness. Dizziness or balance problems. Poor vision. Medicines that cause dizziness or drowsiness. These may include: Medicines for your blood pressure, heart, anxiety, insomnia, or swelling (edema). Pain medicines. Muscle relaxants. Other risk factors include: Drinking alcohol. Having had a fall in the past. Having foot pain or wearing improper footwear. Working at a dangerous job. Having any of the following in your home: Tripping hazards, such as floor clutter or loose rugs. Poor lighting. Pets. Having dementia or memory loss. What actions can I take to lower my risk of falling?     Physical activity Stay physically fit. Do strength and balance exercises. Consider taking a regular class to build strength and balance. Yoga and tai chi are good options. Vision Have your eyes checked every year and your prescription for glasses or contacts updated as needed. Shoes and walking aids Wear non-skid shoes. Wear shoes that have rubber soles and low heels. Do not wear high heels. Do not walk around the house in socks or slippers. Use a cane or walker as told by your provider. Home safety Attach secure railings on both sides of your stairs. Install grab bars for your bathtub, shower, and toilet. Use a non-skid mat in your bathtub or shower. Attach bath mats securely with double-sided, non-slip rug tape. Use good lighting in all rooms. Keep a flashlight near your bed. Make sure there is a clear path from your bed to the bathroom. Use night-lights. Do not use throw rugs. Make sure all carpeting is taped or tacked down securely. Remove all clutter from walkways and stairways, including extension cords. Repair uneven or broken steps and floors. Avoid walking on icy or slippery surfaces. Walk on the grass instead of on icy or slick sidewalks. Use ice melter to get rid of ice on  walkways in the winter. Use a cordless phone. Questions to ask your health care provider Can you help me check my risk for a fall? Do any of my medicines make me more likely to fall? Should I take a vitamin D supplement? What exercises can I do to improve my strength and balance? Should I make an appointment to have my vision checked? Do I need a bone density test to check for weak bones (osteoporosis)? Would it help to use a cane or a walker? Where to find more information Centers for Disease Control and Prevention, [employer]: TonerPromos.no Community-Based Fall Prevention Programs: TonerPromos.no [employer] on Aging: BaseRingTones.pl Contact a health care provider if: You fall at home. You are afraid of falling at home. You feel weak, drowsy, or dizzy. This information is not intended to replace advice given to you by your health care provider. Make sure you discuss any questions you have with your health care provider. Document Revised: 04/12/2022 Document Reviewed: 04/12/2022 Elsevier Patient Education  2024 [employer].

## 2023-09-23 NOTE — Progress Notes (Signed)
Please attest and cosign this visit due to patients primary care provider not being in the office at the time the visit was completed.   Subjective:   [name] is a 77 y.o. female who presents for Medicare Annual (Subsequent) preventive examination.  Visit Complete: Virtual I connected with  Adonis Housekeeper on 09/23/23 by a audio enabled telemedicine application and verified that I am speaking with the correct person using two identifiers.  Patient Location: Home  Provider Location: Home Office  I discussed the limitations of evaluation and management by telemedicine. The patient expressed understanding and agreed to proceed.  Vital Signs: Because this visit was a virtual/telehealth visit, some criteria may be missing or patient reported. Any vitals not documented were not able to be obtained and vitals that have been documented are patient reported.  Patient Medicare AWV questionnaire was completed by the patient on na; I have confirmed that all information answered by patient is correct and no changes since this date.  Cardiac Risk Factors include: advanced age (>73men, >76 women);diabetes mellitus;dyslipidemia;sedentary lifestyle     Objective:    Today's Vitals   09/23/23 1321  BP: (!) 142/82  Weight: 179 lb (81.2 kg)  Height: 5\' 5"  (1.651 m)   Body mass index is 29.79 kg/m.     09/23/2023    1:20 PM 09/23/2023    1:05 PM 09/17/2022    1:54 PM 10/13/2018    8:52 AM 09/30/2018    9:57 PM 05/25/2017    1:37 PM 02/09/2017   12:02 PM  Advanced Directives  Does Patient Have a Medical Advance Directive? No No Yes  Yes Yes No;Yes  Type of Advance Directive   Living will;Healthcare Power of Radio producer Power of [employer] Power of [employer] Power of [place];Living will  Does patient want to make changes to medical advance directive?   No - Patient declined  No - Patient declined    Copy of Healthcare Power of  Attorney in Chart?   No - copy requested   No - copy requested No - copy requested  Would patient like information on creating a medical advance directive? No - Patient declined No - Patient declined     No - Patient declined    Current Medications (verified) Outpatient Encounter Medications as of 09/23/2023  Medication Sig   aspirin EC 81 MG tablet Take 1 tablet (81 mg total) by mouth daily. Swallow whole.   atorvastatin (LIPITOR) 40 MG tablet Take 1 tablet (40 mg total) by mouth daily.   metFORMIN (GLUCOPHAGE) 500 MG tablet Take 1 tablet (500 mg total) by mouth 2 (two) times daily with a meal.   metoprolol tartrate (LOPRESSOR) 25 MG tablet Take 1 tablet (25 mg total) by mouth 2 (two) times daily.   pantoprazole (PROTONIX) 40 MG tablet Take 1 tablet (40 mg total) by mouth daily.   pregabalin (LYRICA) 25 MG capsule TAKE 1 CAPSULE BY MOUTH TWICE DAILY.   spironolactone (ALDACTONE) 25 MG tablet Take 1 tablet (25 mg total) by mouth every morning.   TRAVATAN Z 0.004 % SOLN ophthalmic solution Place 1 drop into both eyes at bedtime.    No facility-administered encounter medications on file as of 09/23/2023.    Allergies (verified) Amlodipine, Sulfonamide derivatives, and Valsartan   History: Past Medical History:  Diagnosis Date   Anxiety    Aortic stenosis    Arthritis    Depression    Essential hypertension  Glaucoma    Gout    Hyperlipidemia    Palpitations    SHOULDER PAIN, LEFT 07/03/2008   Qualifier: Diagnosis of   By: Romeo Apple MD, Duffy Rhody       Past Surgical History:  Procedure Laterality Date   APPENDECTOMY     JOINT REPLACEMENT     both knees 2009 and shoulder reconstruction 2006   KNEE ARTHROPLASTY Right    KNEE ARTHROPLASTY Left    LUMBAR LAMINECTOMY/DECOMPRESSION MICRODISCECTOMY N/A 11/28/2015   Procedure: LUMBAR LAMINECTOMY/ DECOMPRESSION MICRODISCECTOMY LUMBAR TWO-THREE, LUMBAR THREE-FOUR  ;  Surgeon: Coletta Memos, MD;  Location: [facility];  Service:  Neurosurgery;  Laterality: N/A;   PARTIAL HYSTERECTOMY     SHOULDER SURGERY     reconstruction   TONSILECTOMY, ADENOIDECTOMY, BILATERAL MYRINGOTOMY AND TUBES     Family History  Problem Relation Age of Onset   Heart attack Mother    Cancer Father    Diabetes Brother    Colon cancer Cousin    Liver disease Neg Hx    Esophageal cancer Neg Hx    Stomach cancer Neg Hx    Social History   Socioeconomic History   Marital status: Widowed    Spouse name: Not on file   Number of children: 1   Years of education: Not on file   Highest education level: Not on file  Occupational History   Occupation: retired  Tobacco Use   Smoking status: Never   Smokeless tobacco: Never  Vaping Use   Vaping status: Never Used  Substance and Sexual Activity   Alcohol use: No   Drug use: No   Sexual activity: Not on file  Other Topics Concern   Not on file  Social History Narrative   Not on file   Social Drivers of Health   Financial Resource Strain: Low Risk  (09/23/2023)   Overall Financial Resource Strain (CARDIA)    Difficulty of Paying Living Expenses: Not hard at all  Food Insecurity: No Food Insecurity (09/23/2023)   Hunger Vital Sign    Worried About Running Out of Food in the Last Year: Never true    Ran Out of Food in the Last Year: Never true  Transportation Needs: No Transportation Needs (09/23/2023)   PRAPARE - Administrator, Civil Service (Medical): No    Lack of Transportation (Non-Medical): No  Physical Activity: Insufficiently Active (09/23/2023)   Exercise Vital Sign    Days of Exercise per Week: 4 days    Minutes of Exercise per Session: 30 min  Stress: No Stress Concern Present (09/23/2023)   [employer] of Occupational Health - Occupational Stress Questionnaire    Feeling of Stress : Not at all  Social Connections: Moderately Isolated (09/23/2023)   Social Connection and Isolation Panel [NHANES]    Frequency of Communication with Friends and Family:  More than three times a week    Frequency of Social Gatherings with Friends and Family: More than three times a week    Attends Religious Services: More than 4 times per year    Active Member of [employer] or Organizations: No    Attends Banker Meetings: Never    Marital Status: Widowed    Tobacco Counseling Counseling given: Yes   Clinical Intake:  Pre-visit preparation completed: Yes  Pain : No/denies pain     BMI - recorded: 29.79 Nutritional Status: BMI 25 -29 Overweight Nutritional Risks: None Diabetes: Yes CBG done?: No (telehealth visit.) Did pt. bring in CBG  monitor from home?: No  How often do you need to have someone help you when you read instructions, pamphlets, or other written materials from your doctor or pharmacy?: 1 - Never  Interpreter Needed?: No  Information entered by :: Maryjean Ka CMA   Activities of Daily Living    09/23/2023    1:10 PM  In your present state of health, do you have any difficulty performing the following activities:  Hearing? 0  Vision? 0  Difficulty concentrating or making decisions? 0  Walking or climbing stairs? 0  Dressing or bathing? 0  Doing errands, shopping? 0  Comment patient has stopped driving due to a recent diagnosis of memory loss. family takes her where she needs to go and to appts.  Preparing Food and eating ? N  Using the Toilet? N  In the past six months, have you accidently leaked urine? N  Do you have problems with loss of bowel control? N  Managing your Medications? N  Managing your Finances? N  Housekeeping or managing your Housekeeping? N    Patient Care Team: Tommie Sams, DO as PCP - General (Family Medicine) Jonelle Sidle, MD as PCP - Cardiology (Cardiology) Elesa Hacker, MD as Referring Physician (Dermatology) Napoleon Form, MD as Consulting Physician (Gastroenterology) Ferman Hamming, DPM as Consulting Physician (Podiatry)  Indicate any recent Medical  Services you may have received from other than [facility] providers in the past year (date may be approximate).     Assessment:   This is a routine wellness examination for [name].  Hearing/Vision screen Hearing Screening - Comments:: Patient denies any hearing difficulties.   Vision Screening - Comments:: Wears rx glasses - up to date with routine eye exams  Patient sees Dr. Daisy Lazar w/ [facility] [place] office.     Goals Addressed             This Visit's Progress    Patient Stated       I'd like to just make it through this year. Recent diagnosis of Mild Cognitive Impairment/early dementia        Depression Screen    09/23/2023    1:12 PM 09/14/2023    2:52 PM 06/14/2023    3:00 PM 03/14/2023    3:09 PM 02/01/2023    3:49 PM 09/17/2022    1:53 PM 07/20/2022   10:04 AM  PHQ 2/9 Scores  PHQ - 2 Score 1 1 1 4  0 0 0  PHQ- 9 Score 1 1 1 4  0      Fall Risk    09/23/2023    1:10 PM 09/14/2023    2:52 PM 06/14/2023    3:00 PM 02/01/2023    3:49 PM 09/17/2022    1:51 PM  Fall Risk   Falls in the past year? 0 0 0 0 0  Number falls in past yr: 0   0 0  Injury with Fall? 0   0 0  Risk for fall due to : No Fall Risks   No Fall Risks   Follow up Falls prevention discussed   Falls evaluation completed Falls prevention discussed;Education provided;Falls evaluation completed    MEDICARE RISK AT HOME: Medicare Risk at Home Any stairs in or around the home?: Yes If so, are there any without handrails?: No Home free of loose throw rugs in walkways, pet beds, electrical cords, etc?: Yes Adequate lighting in your home to reduce risk of falls?: Yes Life alert?: No Use of  a cane, walker or w/c?: No Grab bars in the bathroom?: No Shower chair or bench in shower?: No Elevated toilet seat or a handicapped toilet?: No  TIMED UP AND GO:  Was the test performed?  No    Cognitive Function:      02/02/2023    3:43 PM  Montreal Cognitive Assessment   Visuospatial/  Executive (0/5) 2  Naming (0/3) 3  Attention: Read list of digits (0/2) 2  Attention: Read list of letters (0/1) 1  Attention: Serial 7 subtraction starting at 100 (0/3) 3  Language: Repeat phrase (0/2) 2  Language : Fluency (0/1) 0  Abstraction (0/2) 2  Delayed Recall (0/5) 0  Orientation (0/6) 5  Total 20  Adjusted Score (based on education) 21      09/23/2023    1:07 PM 09/17/2022    1:55 PM  6CIT Screen  What Year? 0 points 0 points  What month? 0 points 0 points  What time? 0 points 0 points  Count back from 20 0 points 0 points  Months in reverse 0 points 0 points  Repeat phrase 0 points 0 points  Total Score 0 points 0 points    Immunizations  There is no immunization history on file for this patient.  TDAP Status: Patient declined vaccine Due, Education has been provided regarding the importance of this vaccine. Advised may receive this vaccine at local pharmacy or Health Dept. Aware to provide a copy of the vaccination record if obtained from local pharmacy or Health Dept. Verbalized acceptance and understanding.   Flu Vaccine status: Declined, Education has been provided regarding the importance of this vaccine but patient still declined. Advised may receive this vaccine at local pharmacy or Health Dept. Aware to provide a copy of the vaccination record if obtained from local pharmacy or Health Dept. Verbalized acceptance and understanding.  Pneumococcal vaccine status: Declined,  Education has been provided regarding the importance of this vaccine but patient still declined. Advised may receive this vaccine at local pharmacy or Health Dept. Aware to provide a copy of the vaccination record if obtained from local pharmacy or Health Dept. Verbalized acceptance and understanding.   Covid-19 vaccine status: Declined, Education has been provided regarding the importance of this vaccine but patient still declined. Advised may receive this vaccine at local pharmacy or Health  Dept.or vaccine clinic. Aware to provide a copy of the vaccination record if obtained from local pharmacy or Health Dept. Verbalized acceptance and understanding.  Qualifies for Shingles Vaccine? Yes   Shingrix Vaccine: Patient declined vaccine.  Due, Education has been provided regarding the importance of this vaccine. Advised may receive this vaccine at local pharmacy or Health Dept. Aware to provide a copy of the vaccination record if obtained from local pharmacy or Health Dept. Verbalized acceptance and understanding.  Screening Tests Health Maintenance  Topic Date Due   Pneumonia Vaccine 28+ Years old (1 of 2 - PCV) Never done   OPHTHALMOLOGY EXAM  Never done   DTaP/Tdap/Td (1 - Tdap) Never done   Zoster Vaccines- Shingrix (1 of 2) Never done   DEXA SCAN  Never done   MAMMOGRAM  01/22/2022   Medicare Annual Wellness (AWV)  09/18/2023   INFLUENZA VACCINE  11/21/2023 (Originally 03/24/2023)   HEMOGLOBIN A1C  03/13/2024   Diabetic kidney evaluation - eGFR measurement  09/13/2024   Diabetic kidney evaluation - Urine ACR  09/13/2024   FOOT EXAM  09/13/2024   Colonoscopy  06/09/2027   HPV VACCINES  Aged Out   COVID-19 Vaccine  Discontinued   Hepatitis C Screening  Discontinued    Health Maintenance  Health Maintenance Due  Topic Date Due   Pneumonia Vaccine 75+ Years old (1 of 2 - PCV) Never done   OPHTHALMOLOGY EXAM  Never done   DTaP/Tdap/Td (1 - Tdap) Never done   Zoster Vaccines- Shingrix (1 of 2) Never done   DEXA SCAN  Never done   MAMMOGRAM  01/22/2022   Medicare Annual Wellness (AWV)  09/18/2023    Colorectal cancer screening: Type of screening: Colonoscopy. Completed 06/08/2017. Repeat every 10 years  Mammogram Status: Patient declined mammogram  Bone Density Status:Patient declined bone density screening  Lung Cancer Screening: (Low Dose CT Chest recommended if Age 80-80 years, 20 pack-year currently smoking OR have quit w/in 15years.) does not qualify.   Lung  Cancer Screening Referral: na  Additional Screening:  Hepatitis C Screening:  Patient declined Hep C screening today  Vision Screening: Recommended annual ophthalmology exams for early detection of glaucoma and other disorders of the eye. Is the patient up to date with their annual eye exam?  No  Who is the provider or what is the name of the office in which the patient attends annual eye exams? [facility] L[facility] If pt is not established with a provider, would they like to be referred to a provider to establish care? No .   Dental Screening: Recommended annual dental exams for proper oral hygiene  Diabetic Foot Exam: Diabetic Foot Exam: Completed 09/14/2023  Community Resource Referral / Chronic Care Management: CRR required this visit?  No   CCM required this visit?  No     Plan:     I have personally reviewed and noted the following in the patient's chart:   Medical and social history Use of alcohol, tobacco or illicit drugs  Current medications and supplements including opioid prescriptions. Patient is not currently taking opioid prescriptions. Functional ability and status Nutritional status Physical activity Advanced directives List of other physicians Hospitalizations, surgeries, and ER visits in previous 12 months Vitals Screenings to include cognitive, depression, and falls Referrals and appointments  In addition, I have reviewed and discussed with patient certain preventive protocols, quality metrics, and best practice recommendations. A written personalized care plan for preventive services as well as general preventive health recommendations were provided to patient.     Jordan Hawks Lamarion Mcevers, CMA   09/23/2023   After Visit Summary: (Mail) Due to this being a telephonic visit, the after visit summary with patients personalized plan was offered to patient via mail   Nurse Notes: see routing comment

## 2023-10-05 ENCOUNTER — Ambulatory Visit (INDEPENDENT_AMBULATORY_CARE_PROVIDER_SITE_OTHER): Payer: Medicare Other | Admitting: Neurology

## 2023-10-05 ENCOUNTER — Encounter: Payer: Self-pay | Admitting: Neurology

## 2023-10-05 VITALS — BP 156/80 | HR 78 | Ht 64.0 in | Wt 174.5 lb

## 2023-10-05 DIAGNOSIS — G3184 Mild cognitive impairment, so stated: Secondary | ICD-10-CM | POA: Diagnosis not present

## 2023-10-05 DIAGNOSIS — G301 Alzheimer's disease with late onset: Secondary | ICD-10-CM

## 2023-10-05 DIAGNOSIS — F02A0 Dementia in other diseases classified elsewhere, mild, without behavioral disturbance, psychotic disturbance, mood disturbance, and anxiety: Secondary | ICD-10-CM

## 2023-10-05 MED ORDER — DONEPEZIL HCL 10 MG PO TABS: 10.0000 mg | ORAL_TABLET | Freq: Every day | ORAL | 3 refills | Status: DC

## 2023-10-05 NOTE — Patient Instructions (Signed)
ATN profile to look for Alzheimer disease biomarkers Start Aricept 5 mg nightly for 2 weeks and if able to tolerate then increase to 10 mg nightly Continue your other medications Continue follow-up PCP Return in 1 year or sooner if worse.  There are well-accepted and sensible ways to reduce risk for Alzheimers disease and other degenerative brain disorders .  Exercise Daily Walk A daily 20 minute walk should be part of your routine. Disease related apathy can be a significant roadblock to exercise and the only way to overcome this is to make it a daily routine and perhaps have a reward at the end (something your loved one loves to eat or drink perhaps) or a personal trainer coming to the home can also be very useful. Most importantly, the patient is much more likely to exercise if the caregiver / spouse does it with him/her. In general a structured, repetitive schedule is best.  General Health: Any diseases which effect your body will effect your brain such as a pneumonia, urinary infection, blood clot, heart attack or stroke. Keep contact with your primary care doctor for regular follow ups.  Sleep. A good nights sleep is healthy for the brain. Seven hours is recommended. If you have insomnia or poor sleep habits we can give you some instructions. If you have sleep apnea wear your mask.  Diet: Eating a heart healthy diet is also a good idea; fish and poultry instead of red meat, nuts (mostly non-peanuts), vegetables, fruits, olive oil or canola oil (instead of butter), minimal salt (use other spices to flavor foods), whole grain rice, bread, cereal and pasta and wine in moderation.Research is now showing that the MIND diet, which is a combination of The Mediterranean diet and the DASH diet, is beneficial for cognitive processing and longevity. Information about this diet can be found in The MIND Diet, a book by Alonna Minium, MS, RDN, and online at  [employer]  Finances, Power of [address] and Advance Directives: You should consider putting legal safeguards in place with regard to financial and medical decision making. While the spouse always has power of attorney for medical and financial issues in the absence of any form, you should consider what you want in case the spouse / caregiver is no longer around or capable of making decisions.

## 2023-10-05 NOTE — Progress Notes (Signed)
[facility]  PATIENT: COURTNEI RUDDELL DOB: 31-Jul-1946  REQUESTING CLINICIAN: Tommie Sams, DO HISTORY FROM: Patient and grandson  REASON FOR VISIT: Memory loss    HISTORICAL  CHIEF COMPLAINT:  Chief Complaint  Patient presents with   New Patient (Initial Visit)    RM12, Grandson "DJ" present, NP/internal referral for cognitive impairment: MMSE SCORE 23,     HISTORY OF PRESENT ILLNESS:  This is 78 year old woman past medical history hypertension, hyperlipidemia, diabetes mellitus who is presenting with her grandson with complaints of memory loss.  History is mainly obtained from grandson.  He tells me that memory has been going on for the past year and a half and getting worse.  It all started when they noticed that patient signed over a large piece of land to his son without event understanding it.  They also noted that she is forgetful, she is repetitive, she misplaces things and start looking for them.  Grandson told me a few months ago they traveled to [place] and the patient was very confused in this new environment.  She was mistaking her grandson for her husband. At home there is increased confusion mainly at night. On one occasion, she was using her cell phone as a TV remote.  She denies any hallucinations, denies any irritability or wanting to get out of the house.  She has not been driving for the past 8 months.   TBI:  No past history of TBI Stroke:   no past history of stroke Seizures:  no past history of seizures Sleep:  no history of sleep apnea.   Mood: patient denies anxiety and depression Family history of Dementia: Denies  Functional status: independent in all ADLs Patient lives with [facility]. Cooking:  no Cleaning: no Shopping: no Bathing: no issues Toileting: no issues Driving: not driving in the past 8 months  Bills: pay bills multiple time Medications: Bubble pack Ever left the stove on by accident?: no Forget how to use  items around the house?: yes Getting lost going to familiar places?: no Forgetting loved ones names?: yes Word finding difficulty? Sometimes  Sleep: good    OTHER MEDICAL CONDITIONS: Diabetes, Hypertension, Hyperlipidemia    REVIEW OF SYSTEMS: Full 14 system review of systems performed and negative with exception of: As noted in the HPI   ALLERGIES: Allergies  Allergen Reactions   Amlodipine Swelling   Sulfonamide Derivatives Itching and Swelling    Redness   Valsartan Cough    HOME MEDICATIONS: Outpatient Medications Prior to Visit  Medication Sig Dispense Refill   aspirin EC 81 MG tablet Take 1 tablet (81 mg total) by mouth daily. Swallow whole. 90 tablet 3   atorvastatin (LIPITOR) 40 MG tablet Take 1 tablet (40 mg total) by mouth daily. 90 tablet 3   metFORMIN (GLUCOPHAGE) 500 MG tablet Take 1 tablet (500 mg total) by mouth 2 (two) times daily with a meal. 180 tablet 3   metoprolol tartrate (LOPRESSOR) 25 MG tablet Take 1 tablet (25 mg total) by mouth 2 (two) times daily. 180 tablet 3   pantoprazole (PROTONIX) 40 MG tablet Take 1 tablet (40 mg total) by mouth daily. 90 tablet 1   pregabalin (LYRICA) 25 MG capsule TAKE 1 CAPSULE BY MOUTH TWICE DAILY. 180 capsule 1   spironolactone (ALDACTONE) 25 MG tablet Take 1 tablet (25 mg total) by mouth every morning. 90 tablet 3   TRAVATAN Z 0.004 % SOLN ophthalmic solution Place 1 drop into both eyes at bedtime.  No facility-administered medications prior to visit.    PAST MEDICAL HISTORY: Past Medical History:  Diagnosis Date   Anxiety    Aortic stenosis    Arthritis    Depression    Essential hypertension    Glaucoma    Gout    Hyperlipidemia    Palpitations    SHOULDER PAIN, LEFT 07/03/2008   Qualifier: Diagnosis of   By: Romeo Apple MD, Duffy Rhody        PAST SURGICAL HISTORY: Past Surgical History:  Procedure Laterality Date   APPENDECTOMY     JOINT REPLACEMENT     both knees 2009 and shoulder reconstruction 2006    KNEE ARTHROPLASTY Right    KNEE ARTHROPLASTY Left    LUMBAR LAMINECTOMY/DECOMPRESSION MICRODISCECTOMY N/A 11/28/2015   Procedure: LUMBAR LAMINECTOMY/ DECOMPRESSION MICRODISCECTOMY LUMBAR TWO-THREE, LUMBAR THREE-FOUR  ;  Surgeon: Coletta Memos, MD;  Location: [facility];  Service: Neurosurgery;  Laterality: N/A;   PARTIAL HYSTERECTOMY     SHOULDER SURGERY     reconstruction   TONSILECTOMY, ADENOIDECTOMY, BILATERAL MYRINGOTOMY AND TUBES      FAMILY HISTORY: Family History  Problem Relation Age of Onset   Heart attack Mother    Cancer Father    Diabetes Brother    Colon cancer Cousin    Liver disease Neg Hx    Esophageal cancer Neg Hx    Stomach cancer Neg Hx     SOCIAL HISTORY: Social History   Socioeconomic History   Marital status: Widowed    Spouse name: Not on file   Number of children: 1   Years of education: Not on file   Highest education level: Not on file  Occupational History   Occupation: retired  Tobacco Use   Smoking status: Never   Smokeless tobacco: Never  Vaping Use   Vaping status: Never Used  Substance and Sexual Activity   Alcohol use: No   Drug use: No   Sexual activity: Not on file  Other Topics Concern   Not on file  Social History Narrative   Not on file   Social Drivers of Health   Financial Resource Strain: Low Risk  (09/23/2023)   Overall Financial Resource Strain (CARDIA)    Difficulty of Paying Living Expenses: Not hard at all  Food Insecurity: No Food Insecurity (09/23/2023)   Hunger Vital Sign    Worried About Running Out of Food in the Last Year: Never true    Ran Out of Food in the Last Year: Never true  Transportation Needs: No Transportation Needs (09/23/2023)   PRAPARE - Administrator, Civil Service (Medical): No    Lack of Transportation (Non-Medical): No  Physical Activity: Insufficiently Active (09/23/2023)   Exercise Vital Sign    Days of Exercise per Week: 4 days    Minutes of Exercise per Session: 30 min   Stress: No Stress Concern Present (09/23/2023)   [employer] of Occupational Health - Occupational Stress Questionnaire    Feeling of Stress : Not at all  Social Connections: Moderately Isolated (09/23/2023)   Social Connection and Isolation Panel [NHANES]    Frequency of Communication with Friends and Family: More than three times a week    Frequency of Social Gatherings with Friends and Family: More than three times a week    Attends Religious Services: More than 4 times per year    Active Member of [employer] or Organizations: No    Attends Banker Meetings: Never    Marital Status:  Widowed  Intimate Partner Violence: Not At Risk (09/23/2023)   Humiliation, Afraid, Rape, and Kick questionnaire    Fear of Current or Ex-Partner: No    Emotionally Abused: No    Physically Abused: No    Sexually Abused: No    PHYSICAL EXAM  GENERAL EXAM/CONSTITUTIONAL: Vitals:  Vitals:   10/05/23 1459 10/05/23 1512  BP: (!) 171/89 (!) 156/80  Pulse: 78   Weight: 174 lb 8 oz (79.2 kg)   Height: 5\' 4"  (1.626 m)    Body mass index is 29.95 kg/m. Wt Readings from Last 3 Encounters:  10/05/23 174 lb 8 oz (79.2 kg)  09/23/23 179 lb (81.2 kg)  09/14/23 179 lb (81.2 kg)   Patient is in no distress; well developed, nourished and groomed; neck is supple  MUSCULOSKELETAL: Gait, strength, tone, movements noted in Neurologic exam below  NEUROLOGIC: MENTAL STATUS:     10/05/2023    3:00 PM  MMSE - Mini Mental State Exam  Orientation to time 3  Orientation to Place 4  Registration 3  Attention/ Calculation 5  Recall 0  Language- name 2 objects 2  Language- repeat 1  Language- follow 3 step command 3  Language- read & follow direction 1  Write a sentence 1  Copy design 0  Total score 23   awake, alert, oriented to person, place and time recent and remote memory intact normal attention and concentration language fluent, comprehension intact, naming intact fund of knowledge  appropriate  CRANIAL NERVE:  2nd, 3rd, 4th, 6th- visual fields full to confrontation, extraocular muscles intact, no nystagmus 5th - facial sensation symmetric 7th - facial strength symmetric 8th - hearing intact 9th - palate elevates symmetrically, uvula midline 11th - shoulder shrug symmetric 12th - tongue protrusion midline  MOTOR:  normal bulk and tone, full strength in the BUE, BLE  SENSORY:  normal and symmetric to light touch  COORDINATION:  finger-nose-finger, fine finger movements normal  GAIT/STATION:  normal    DIAGNOSTIC DATA (LABS, IMAGING, TESTING) - I reviewed patient records, labs, notes, testing and imaging myself where available.  Lab Results  Component Value Date   WBC 8.2 07/20/2022   HGB 16.7 (H) 07/20/2022   HCT 49.5 (H) 07/20/2022   MCV 95 07/20/2022   PLT 240 07/20/2022      Component Value Date/Time   NA 141 09/14/2023 1547   K 4.2 09/14/2023 1547   CL 97 09/14/2023 1547   CO2 25 09/14/2023 1547   GLUCOSE 170 (H) 09/14/2023 1547   GLUCOSE 133 (H) 10/13/2018 0922   BUN 16 09/14/2023 1547   CREATININE 0.86 09/14/2023 1547   CALCIUM 11.1 (H) 09/14/2023 1547   PROT 7.2 09/14/2023 1547   ALBUMIN 4.8 09/14/2023 1547   AST 19 09/14/2023 1547   ALT 23 09/14/2023 1547   ALKPHOS 100 09/14/2023 1547   BILITOT 1.0 09/14/2023 1547   GFRNONAA 67 09/18/2019 1116   GFRAA 78 09/18/2019 1116   Lab Results  Component Value Date   CHOL 142 03/14/2023   HDL 59 03/14/2023   LDLCALC 68 03/14/2023   TRIG 80 03/14/2023   CHOLHDL 2.4 03/14/2023   Lab Results  Component Value Date   HGBA1C 6.4 (H) 09/14/2023   Lab Results  Component Value Date   VITAMINB12 1,353 (H) 07/20/2022   Lab Results  Component Value Date   TSH 2.390 02/02/2023    MRI Brain 03/02/2023 1. No evidence of an acute intracranial abnormality. 2. Minimal chronic small vessel ischemic  changes within the cerebral white matter. 3. Possible tiny chronic cortical infarct within  the left parietal lobe. 4. 5 mm focus of signal abnormality within the ventral pons, which may reflect a cavernoma or small chronic hemorrhage. 5. Mild generalized cerebral atrophy. 6. Trace fluid within the right mastoid air cells.   ASSESSMENT AND PLAN  78 y.o. year old female with history of hypertension, hyperlipidemia, diabetes mellitus who is presenting with complaints of memory loss for the past year and a half.  Memory loss described as being forgetful, misplacing items and getting confused on how to use items at the house.  On exam today she scored 23 out of 30 on MMSE indicative of impairment.  Patient likely has mild dementia, Alzheimer type.  Plan will be to obtain ATN profile to look for Alzheimer disease biomarker and we will start her on Aricept, 5 mg nightly for 2 weeks and if able to tolerate then will increase to 10 mg nightly.  Advised patient to continue her current medications, continue follow-up with PCP and return as needed.  We also discussed ways to reduce the progression of the disease including exercise, keeping a good health, good diet and good sleep.  They voiced understanding.  I will see them in 1 year for follow-up or sooner if worse.   1. Mild late onset Alzheimer's dementia without behavioral disturbance, psychotic disturbance, mood disturbance, or anxiety (HCC)      Patient Instructions  ATN profile to look for Alzheimer disease biomarkers Start Aricept 5 mg nightly for 2 weeks and if able to tolerate then increase to 10 mg nightly Continue your other medications Continue follow-up PCP Return in 1 year or sooner if worse.  There are well-accepted and sensible ways to reduce risk for Alzheimers disease and other degenerative brain disorders .  Exercise Daily Walk A daily 20 minute walk should be part of your routine. Disease related apathy can be a significant roadblock to exercise and the only way to overcome this is to make it a daily routine and perhaps  have a reward at the end (something your loved one loves to eat or drink perhaps) or a personal trainer coming to the home can also be very useful. Most importantly, the patient is much more likely to exercise if the caregiver / spouse does it with him/her. In general a structured, repetitive schedule is best.  General Health: Any diseases which effect your body will effect your brain such as a pneumonia, urinary infection, blood clot, heart attack or stroke. Keep contact with your primary care doctor for regular follow ups.  Sleep. A good nights sleep is healthy for the brain. Seven hours is recommended. If you have insomnia or poor sleep habits we can give you some instructions. If you have sleep apnea wear your mask.  Diet: Eating a heart healthy diet is also a good idea; fish and poultry instead of red meat, nuts (mostly non-peanuts), vegetables, fruits, olive oil or canola oil (instead of butter), minimal salt (use other spices to flavor foods), whole grain rice, bread, cereal and pasta and wine in moderation.Research is now showing that the MIND diet, which is a combination of The Mediterranean diet and the DASH diet, is beneficial for cognitive processing and longevity. Information about this diet can be found in The MIND Diet, a book by Alonna Minium, MS, RDN, and online at [employer].es  Finances, Power of [address] and Advance Directives: You should consider putting legal safeguards in place with regard to  financial and medical decision making. While the spouse always has power of attorney for medical and financial issues in the absence of any form, you should consider what you want in case the spouse / caregiver is no longer around or capable of making decisions.   Orders Placed This Encounter  Procedures   ATN PROFILE    Meds ordered this encounter  Medications   donepezil (ARICEPT) 10 MG tablet    Sig: Take 1 tablet (10 mg total) by mouth at bedtime.     Dispense:  30 tablet    Refill:  3    Return in about 1 year (around 10/04/2024).    Windell Norfolk, MD 10/05/2023, 5:12 PM  [facility] [address], [address] [place], [place] [address] [phone]

## 2023-10-08 LAB — ATN PROFILE
A -- Beta-amyloid 42/40 Ratio: 0.097 — ABNORMAL LOW (ref >0.102)
Beta-amyloid 40: 223.83 pg/mL
Beta-amyloid 42: 21.72 pg/mL
N -- NfL, Plasma: 5.16 pg/mL (ref 0.00–7.64)
T -- p-tau181: 1.88 pg/mL — ABNORMAL HIGH (ref 0.00–0.97)

## 2023-10-10 NOTE — Progress Notes (Signed)
Please call and advise the patient/grandson that the recent labs we checked confirm the presence of Alzheimer dementia biomarker. Inform him that patient memory loss is likely for  Alzheimer dementia and for now continue the medication that we have started and call us for any side effect.  Please remind patient to keep any upcoming appointments or tests and to call us with any interim questions, concerns, problems or updates. Thanks,   Windell Norfolk, MD

## 2023-12-23 ENCOUNTER — Other Ambulatory Visit (HOSPITAL_COMMUNITY): Payer: Self-pay | Admitting: Family Medicine

## 2023-12-23 DIAGNOSIS — Z1231 Encounter for screening mammogram for malignant neoplasm of breast: Secondary | ICD-10-CM

## 2024-01-02 ENCOUNTER — Encounter (HOSPITAL_COMMUNITY): Payer: Self-pay

## 2024-01-02 ENCOUNTER — Ambulatory Visit (HOSPITAL_COMMUNITY)
Admission: RE | Admit: 2024-01-02 | Discharge: 2024-01-02 | Disposition: A | Discharge status: Home / Self Care | Source: Ambulatory Visit | Attending: Family Medicine | Admitting: Family Medicine

## 2024-01-02 DIAGNOSIS — Z1231 Encounter for screening mammogram for malignant neoplasm of breast: Secondary | ICD-10-CM | POA: Diagnosis not present

## 2024-01-05 ENCOUNTER — Ambulatory Visit: Payer: Self-pay

## 2024-01-05 ENCOUNTER — Inpatient Hospital Stay
Admission: RE | Admit: 2024-01-05 | Discharge: 2024-01-05 | Disposition: A | Discharge status: Home / Self Care | Payer: Self-pay | Source: Ambulatory Visit | Attending: Family Medicine | Admitting: Family Medicine

## 2024-01-05 ENCOUNTER — Other Ambulatory Visit (HOSPITAL_COMMUNITY): Payer: Self-pay | Admitting: Family Medicine

## 2024-01-05 DIAGNOSIS — Z1231 Encounter for screening mammogram for malignant neoplasm of breast: Secondary | ICD-10-CM

## 2024-01-05 DIAGNOSIS — Z6830 Body mass index (BMI) 30.0-30.9, adult: Secondary | ICD-10-CM | POA: Diagnosis not present

## 2024-01-05 DIAGNOSIS — R030 Elevated blood-pressure reading, without diagnosis of hypertension: Secondary | ICD-10-CM | POA: Diagnosis not present

## 2024-01-05 DIAGNOSIS — R319 Hematuria, unspecified: Secondary | ICD-10-CM | POA: Diagnosis not present

## 2024-01-05 DIAGNOSIS — E669 Obesity, unspecified: Secondary | ICD-10-CM | POA: Diagnosis not present

## 2024-01-05 DIAGNOSIS — N390 Urinary tract infection, site not specified: Secondary | ICD-10-CM | POA: Diagnosis not present

## 2024-01-05 DIAGNOSIS — R300 Dysuria: Secondary | ICD-10-CM | POA: Diagnosis not present

## 2024-01-05 NOTE — Telephone Encounter (Signed)
 Noted- patient is going to urgent care

## 2024-01-05 NOTE — Telephone Encounter (Signed)
 Copied from CRM [id number]. Topic: Clinical - Red Word Triage >> Jan 05, 2024 11:45 AM [name] wrote: Red Word that prompted transfer to Nurse Triage: Patient states pain in her private parts and doesn't.  Constant pain, onset yesterday. Reports no other symptoms.   Chief Complaint: Pain with Urination Symptoms: pain Frequency: x 1 day Pertinent Negatives: Patient denies fever, nausea, vomiting, diarrhea Disposition: [] ED /[x] Urgent Care (no appt availability in office) / [] Appointment(In office/virtual)/ []  [facility]/ [] Home Care/ [] Refused Recommended Disposition /[] [facility]/ []  Follow-up with PCP Additional Notes: Patient called and advised that she was having pain with urination that started yesterday. Patient denies any fevers, nausea, vomiting, diarrhea, urinary retention, blood in her urine, or flank pain. Patient advised that the recommendation for her symptoms is that she is seen and evaluated in the next 4 hours.  With no availability in the patient's PCP office in the next few days, patient is advised that Urgent Care would be the next recommendation. Patient asked her family if someone would take her to Urgent Care today and she was advised that they would. Caller is advised that if anything worsens to go to the Emergency Room. Caller verbalized understanding and agreed with plan.    Reason for Disposition  Diabetes mellitus or weak immune system (e.g., HIV positive, cancer chemo, splenectomy, organ transplant, chronic steroids)  Answer Assessment - Initial Assessment Questions 1. SEVERITY: "How bad is the pain?"  (e.g., Scale 1-10; mild, moderate, or severe)   - MILD (1-3): complains slightly about urination hurting   - MODERATE (4-7): interferes with normal activities     - SEVERE (8-10): excruciating, unwilling or unable to urinate because of the pain      5 2. FREQUENCY: "How many times have you had painful urination today?"      ---- 3.  PATTERN: "Is pain present every time you urinate or just sometimes?"      All the time but worse with urination 4. ONSET: "When did the painful urination start?"      yesterday 5. FEVER: "Do you have a fever?" If Yes, ask: "What is your temperature, how was it measured, and when did it start?"     No 6. PAST UTI: "Have you had a urine infection before?" If Yes, ask: "When was the last time?" and "What happened that time?"      Unknown 7. CAUSE: "What do you think is causing the painful urination?"  (e.g., UTI, scratch, Herpes sore)     unknown 8. OTHER SYMPTOMS: "Do you have any other symptoms?" (e.g., blood in urine, flank pain, genital sores, urgency, vaginal discharge)     No  Protocols used: Urination Pain - Female-A-AH

## 2024-01-23 DIAGNOSIS — N390 Urinary tract infection, site not specified: Secondary | ICD-10-CM | POA: Diagnosis not present

## 2024-01-23 DIAGNOSIS — R030 Elevated blood-pressure reading, without diagnosis of hypertension: Secondary | ICD-10-CM | POA: Diagnosis not present

## 2024-01-27 ENCOUNTER — Other Ambulatory Visit: Payer: Self-pay | Admitting: Family Medicine

## 2024-01-30 DIAGNOSIS — R030 Elevated blood-pressure reading, without diagnosis of hypertension: Secondary | ICD-10-CM | POA: Diagnosis not present

## 2024-01-30 DIAGNOSIS — E663 Overweight: Secondary | ICD-10-CM | POA: Diagnosis not present

## 2024-01-30 DIAGNOSIS — N390 Urinary tract infection, site not specified: Secondary | ICD-10-CM | POA: Diagnosis not present

## 2024-01-30 DIAGNOSIS — Z6829 Body mass index (BMI) 29.0-29.9, adult: Secondary | ICD-10-CM | POA: Diagnosis not present

## 2024-02-14 ENCOUNTER — Ambulatory Visit: Payer: Self-pay

## 2024-02-14 ENCOUNTER — Encounter: Payer: Self-pay | Admitting: Family Medicine

## 2024-02-14 ENCOUNTER — Ambulatory Visit: Admitting: Family Medicine

## 2024-02-14 VITALS — BP 141/81 | HR 72 | Temp 97.7°F | Ht 64.0 in | Wt 173.0 lb

## 2024-02-14 DIAGNOSIS — G309 Alzheimer's disease, unspecified: Secondary | ICD-10-CM | POA: Insufficient documentation

## 2024-02-14 DIAGNOSIS — R300 Dysuria: Secondary | ICD-10-CM

## 2024-02-14 LAB — POCT URINALYSIS DIP (CLINITEK)
Bilirubin, UA: NEGATIVE
Blood, UA: NEGATIVE
Glucose, UA: NEGATIVE mg/dL
Ketones, POC UA: NEGATIVE mg/dL
Nitrite, UA: NEGATIVE
Spec Grav, UA: 1.02 (ref 1.010–1.025)
Urobilinogen, UA: 0.2 U/dL
pH, UA: 6 (ref 5.0–8.0)

## 2024-02-14 MED ORDER — CEFUROXIME AXETIL 250 MG PO TABS: 250.0000 mg | ORAL_TABLET | Freq: Two times a day (BID) | ORAL | 0 refills | Status: AC

## 2024-02-14 NOTE — Progress Notes (Signed)
 Subjective:  Patient ID: [name], female    DOB: 1945/10/01  Age: 78 y.o. MRN: [id number]  CC:   Chief Complaint  Patient presents with   Dysuria    Ongoing issues off and on for aprox 2 months    HPI:  78 year old female presents with her grandson with concerns for UTI.  She has had recent UTIs.  Most recently has had symptoms for the past few days.  She reports some discomfort and also notes urinary frequency.  No abdominal pain.  No fever.  No flank pain.  Has had recent antibiotics: Nitrofurantoin and Keflex .  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   Aortic regurgitation 07/20/2022   Peripheral neuropathy 07/20/2022   GERD (gastroesophageal reflux disease) 07/20/2022   Essential hypertension 10/25/2018   DDD (degenerative disc disease), lumbosacral 01/11/2013   S/P total knee replacement 09/15/2011    Social Hx   Social History   Socioeconomic History   Marital status: Widowed    Spouse name: Not on file   Number of children: 1   Years of education: Not on file   Highest education level: Not on file  Occupational History   Occupation: retired  Tobacco Use   Smoking status: Never   Smokeless tobacco: Never  Vaping Use   Vaping status: Never Used  Substance and Sexual Activity   Alcohol  use: No   Drug use: No   Sexual activity: Not on file  Other Topics Concern   Not on file  Social History Narrative   Not on file   Social Drivers of Health   Financial Resource Strain: Low Risk  (09/23/2023)   Overall Financial Resource Strain (CARDIA)    Difficulty of Paying Living Expenses: Not hard at all  Food Insecurity: No Food Insecurity (09/23/2023)   Hunger Vital Sign    Worried About Running  Out of Food in the Last Year: Never true    Ran Out of Food in the Last Year: Never true  Transportation Needs: No Transportation Needs (09/23/2023)   PRAPARE - Administrator, Civil Service (Medical): No    Lack of Transportation (Non-Medical): No  Physical Activity: Insufficiently Active (09/23/2023)   Exercise Vital Sign    Days of Exercise per Week: 4 days    Minutes of Exercise per Session: 30 min  Stress: No Stress Concern Present (09/23/2023)   [employer] of Occupational Health - Occupational Stress Questionnaire    Feeling of Stress : Not at all  Social Connections: Moderately Isolated (09/23/2023)   Social Connection and Isolation Panel    Frequency of Communication with Friends and Family: More than three times a week    Frequency of Social Gatherings with Friends and Family: More than three times a week    Attends Religious Services: More than 4 times per year    Active Member of [employer] or Organizations: No    Attends Banker Meetings: Never    Marital Status: Widowed    Review of Systems Per HPI  Objective:  BP (!) 141/81   Pulse 72   Temp 97.7 F (36.5 C)   Ht 5' 4 (1.626 m)   Wt 173 lb (78.5 kg)   SpO2 97%   BMI  29.70 kg/m      02/14/2024    1:34 PM 10/05/2023    3:12 PM 10/05/2023    2:59 PM  BP/Weight  Systolic BP 141 156 171  Diastolic BP 81 80 89  Wt. (Lbs) 173  174.5  BMI 29.7 kg/m2  29.95 kg/m2    Physical Exam Vitals and nursing note reviewed.  Constitutional:      General: She is not in acute distress.    Appearance: Normal appearance.  HENT:     Head: Normocephalic and atraumatic.   Cardiovascular:     Rate and Rhythm: Normal rate and regular rhythm.  Pulmonary:     Effort: Pulmonary effort is normal.     Breath sounds: Normal breath sounds.  Abdominal:     General: There is no distension.     Palpations: Abdomen is soft.     Tenderness: There is no abdominal tenderness.   Neurological:     Mental  Status: She is alert.     Lab Results  Component Value Date   WBC 8.2 07/20/2022   HGB 16.7 (H) 07/20/2022   HCT 49.5 (H) 07/20/2022   PLT 240 07/20/2022   GLUCOSE 170 (H) 09/14/2023   CHOL 142 03/14/2023   TRIG 80 03/14/2023   HDL 59 03/14/2023   LDLCALC 68 03/14/2023   ALT 23 09/14/2023   AST 19 09/14/2023   NA 141 09/14/2023   K 4.2 09/14/2023   CL 97 09/14/2023   CREATININE 0.86 09/14/2023   BUN 16 09/14/2023   CO2 25 09/14/2023   TSH 2.390 02/02/2023   INR 1.5 05/24/2008   HGBA1C 6.4 (H) 09/14/2023     Assessment & Plan:  Dysuria Assessment & Plan: Concern for UTI.  Sending culture. Starting on Cefuroxime. Awaiting culture.  Orders: -     POCT URINALYSIS DIP (CLINITEK) -     Urine Culture -     Cefuroxime Axetil; Take 1 tablet (250 mg total) by mouth 2 (two) times daily with a meal for 10 days.  Dispense: 14 tablet; Refill: 0  Hypercalcemia Assessment & Plan: Has not had workup.  Did not get her labs drawn.  Labs have been ordered again today.  She will go get these done today.  Orders: -     PTH, intact and calcium  -     VITAMIN D  25 Hydroxy (Vit-D Deficiency, Fractures) -     PTH-related peptide    Follow-up:  Return if symptoms worsen or fail to improve.  Jacqulyn Ahle DO [facility]

## 2024-02-14 NOTE — Assessment & Plan Note (Signed)
 Concern for UTI.  Sending culture. Starting on Cefuroxime. Awaiting culture.

## 2024-02-14 NOTE — Telephone Encounter (Signed)
 noted

## 2024-02-14 NOTE — Patient Instructions (Signed)
 Medication as directed.  Awaiting culture.  Lab today.

## 2024-02-14 NOTE — Assessment & Plan Note (Signed)
 Has not had workup.  Did not get her labs drawn.  Labs have been ordered again today.  She will go get these done today.

## 2024-02-14 NOTE — Telephone Encounter (Signed)
 FYI Only or Action Required?: FYI only for provider.  Patient was last seen in primary care on 09/14/2023 by Cook, Jayce G, DO. Called Nurse Triage reporting burning with urination. Symptoms began yesterday. Interventions attempted: Rest, hydration, or home remedies. Symptoms are: unchanged.  Triage Disposition: See HCP Within 4 Hours (Or PCP Triage)  Patient/caregiver understands and will follow disposition?: Yes  Copied from CRM [id number]. Topic: Clinical - Red Word Triage >> Feb 14, 2024 11:31 AM [name] wrote: Patient is having a flare up with the UTI Reason for Disposition  [1] SEVERE pain with urination (e.g., excruciating) AND [2] not improved after 2 hours of pain medicine and Sitz bath  Answer Assessment - Initial Assessment Questions 1. SEVERITY: How bad is the pain?  (e.g., Scale 1-10; mild, moderate, or severe)   - MILD (1-3): complains slightly about urination hurting   - MODERATE (4-7): interferes with normal activities     - SEVERE (8-10): excruciating, unwilling or unable to urinate because of the pain      7-8 out of 10 2. FREQUENCY: How many times have you had painful urination today?      Patient states she is feeling discomfort every time 3. PATTERN: Is pain present every time you urinate or just sometimes?      Every time 4. ONSET: When did the painful urination start?      Started two days ago 5. FEVER: Do you have a fever? If Yes, ask: What is your temperature, how was it measured, and when did it start?     no 6. PAST UTI: Have you had a urine infection before? If Yes, ask: When was the last time? and What happened that time?      yes 7. CAUSE: What do you think is causing the painful urination?  (e.g., UTI, scratch, Herpes sore)     Possible UTI 8. OTHER SYMPTOMS: Do you have any other symptoms? (e.g., blood in urine, flank pain, genital sores, urgency, vaginal discharge)     Urinary frequency  Protocols used: Urination Pain -  Female-A-AH

## 2024-02-17 LAB — URINE CULTURE

## 2024-02-23 ENCOUNTER — Other Ambulatory Visit: Payer: Self-pay | Admitting: Neurology

## 2024-02-23 ENCOUNTER — Ambulatory Visit: Payer: Self-pay | Admitting: Family Medicine

## 2024-02-23 LAB — PTH-RELATED PEPTIDE: PTH-related peptide: <2 pmol/L

## 2024-02-23 LAB — PTH, INTACT AND CALCIUM
Calcium: 10.1 mg/dL (ref 8.7–10.3)
PTH: 29 pg/mL (ref 15–65)

## 2024-02-23 LAB — VITAMIN D 25 HYDROXY (VIT D DEFICIENCY, FRACTURES): Vit D, 25-Hydroxy: 40.5 ng/mL (ref 30.0–100.0)

## 2024-03-08 ENCOUNTER — Ambulatory Visit: Payer: Self-pay

## 2024-03-08 ENCOUNTER — Ambulatory Visit: Admitting: Nurse Practitioner

## 2024-03-08 VITALS — BP 121/73 | HR 64 | Temp 98.4°F | Ht 64.0 in | Wt 172.8 lb

## 2024-03-08 DIAGNOSIS — R300 Dysuria: Secondary | ICD-10-CM | POA: Diagnosis not present

## 2024-03-08 LAB — POCT URINALYSIS DIP (CLINITEK)
Bilirubin, UA: NEGATIVE
Blood, UA: NEGATIVE
Glucose, UA: NEGATIVE mg/dL
Ketones, POC UA: NEGATIVE mg/dL
Leukocytes, UA: NEGATIVE
Nitrite, UA: NEGATIVE
Spec Grav, UA: >=1.03 — AB (ref 1.010–1.025)
Urobilinogen, UA: 0.2 U/dL
pH, UA: 6 (ref 5.0–8.0)

## 2024-03-08 LAB — POCT UA - MICROSCOPIC ONLY: Bacteria, U Microscopic: NEGATIVE

## 2024-03-08 NOTE — Telephone Encounter (Signed)
 Appt today

## 2024-03-08 NOTE — Telephone Encounter (Signed)
 FYI Only or Action Required?: FYI only for provider.  Patient was last seen in primary care on 02/14/2024 by Cook, Jayce G, DO.  Called Nurse Triage reporting Dysuria.  Symptoms began several weeks ago.  Interventions attempted: Nothing.  Symptoms are: gradually worsening.  Triage Disposition: See Physician Within 24 Hours  Patient/caregiver understands and will follow disposition?: Yes    Still has symptoms of UTI- pain and urgency no fever- [phone] Caretaker Bascom Husband   Reason for Disposition  All other patients with painful urination  (Exception: [1] EITHER frequency or urgency AND [2] has on-call doctor.)  Answer Assessment - Initial Assessment Questions 1. SEVERITY: How bad is the pain?  (e.g., Scale 1-10; mild, moderate, or severe)     5/10  3. PATTERN: Is pain present every time you urinate or just sometimes?      sometimes 4. ONSET: When did the painful urination start?      Weeks ago 5. FEVER: Do you have a fever? If Yes, ask: What is your temperature, how was it measured, and when did it start?     no 6. PAST UTI: Have you had a urine infection before? If Yes, ask: When was the last time? and What happened that time?      Yes a week ago  7. CAUSE: What do you think is causing the painful urination?  (e.g., UTI, scratch, Herpes sore)     uti 8. OTHER SYMPTOMS: Do you have any other symptoms? (e.g., blood in urine, flank pain, genital sores, urgency, vaginal discharge)     Urgency and vaginal pain  Protocols used: Urination Pain - Female-A-AH

## 2024-03-09 ENCOUNTER — Encounter: Payer: Self-pay | Admitting: Nurse Practitioner

## 2024-03-09 NOTE — Progress Notes (Signed)
 Subjective:    Patient ID: [name], female    DOB: 08-15-1946, 78 y.o.   MRN: [id number]  HPI Presents with her caregiver Bascom for c/o urinary symptoms. Has been to local [facility] twice over the past 3 months and diagnosed/treated for UTI. Records unavailable during visit. Most recently seen here on 6/24 for dysuria. Today, c/o dysuria and frequent urination. Occasional urgency. No fever. No back or flank pain. Pain mainly at the urethra, not only when she urinates. Denies vaginal discharge, itching or burning. Has appointment with urology on 8/5. Mainly concerned about possible UTI today. Taking fluids well. Note patient has mild dementia. Part of the history provided by caregiver.    Review of Systems  Constitutional:  Negative for fever.  Respiratory:  Negative for cough, chest tightness and shortness of breath.   Genitourinary:  Positive for dysuria, frequency and urgency. Negative for enuresis, flank pain, genital sores, pelvic pain and vaginal discharge.   Social History   Tobacco Use   Smoking status: Never   Smokeless tobacco: Never  Vaping Use   Vaping status: Never Used  Substance Use Topics   Alcohol  use: No   Drug use: No        Objective:   Physical Exam Vitals and nursing note reviewed. Exam conducted with a chaperone present.  Constitutional:      General: She is not in acute distress. Cardiovascular:     Rate and Rhythm: Normal rate and regular rhythm.  Pulmonary:     Effort: Pulmonary effort is normal.     Breath sounds: Normal breath sounds.  Abdominal:     General: There is no distension.     Tenderness: There is no abdominal tenderness. There is no right CVA tenderness, left CVA tenderness, guarding or rebound.  Neurological:     Mental Status: She is alert.  Psychiatric:        Mood and Affect: Mood normal.        Behavior: Behavior normal.    Results for orders placed or performed in visit on 03/08/24  POCT URINALYSIS DIP  (CLINITEK)   Collection Time: 03/08/24  3:26 PM  Result Value Ref Range   Color, UA yellow yellow   Clarity, UA clear clear   Glucose, UA negative negative mg/dL   Bilirubin, UA negative negative   Ketones, POC UA negative negative mg/dL   Spec Grav, UA >=8.969 (A) 1.010 - 1.025   Blood, UA negative negative   pH, UA 6.0 5.0 - 8.0   POC PROTEIN,UA trace negative, trace   Urobilinogen, UA 0.2 0.2 or 1.0 E.U./dL   Nitrite, UA Negative Negative   Leukocytes, UA Negative Negative  POCT UA - Microscopic Only   Collection Time: 03/08/24  4:35 PM  Result Value Ref Range   WBC, Ur, HPF, POC 0-5 0 - 5   RBC, Urine, Miroscopic rare 0 - 2   Bacteria, U Microscopic neg None - Trace   Mucus, UA     Epithelial cells, urine per micros occas    Crystals, Ur, HPF, POC     Casts, Ur, LPF, POC     Yeast, UA     Today's Vitals   03/08/24 1535 03/08/24 1541  BP: (!) 141/78 121/73  Pulse: 64   Temp: 98.4 F (36.9 C)   SpO2: 98%   Weight: 172 lb 12.8 oz (78.4 kg)   Height: 5' 4 (1.626 m)    Body mass index is 29.66 kg/m.  Urine culture 02/14/2024: Urine Culture, Routine Final report  Organism ID, Bacteria Comment  Comment: Mixed urogenital flora Less than 10,000 colonies/mL  Resulting Agency [id number]         Assessment & Plan:  Dysuria - Plan: POCT URINALYSIS DIP (CLINITEK), Urine Culture, POCT UA - Microscopic Only  Urine sent for culture. Last culture in June was negative. ROS to [facility].  Hold on antibiotics at this time especially to avoid overuse and no red flags noted.  Warning signs reviewed with patient and her caregiver.  Call back over the next few days if any new or worsening symptoms.  Otherwise hold for urine culture.  Follow-up with urology as planned.  Also follow-up with Dr. Bluford on 7/22 as planned.

## 2024-03-10 LAB — URINE CULTURE

## 2024-03-10 LAB — SPECIMEN STATUS REPORT

## 2024-03-11 ENCOUNTER — Ambulatory Visit: Payer: Self-pay | Admitting: Nurse Practitioner

## 2024-03-13 ENCOUNTER — Encounter: Payer: Self-pay | Admitting: Cardiology

## 2024-03-13 ENCOUNTER — Ambulatory Visit: Payer: Medicare Other | Admitting: Family Medicine

## 2024-03-13 ENCOUNTER — Ambulatory Visit: Attending: Cardiology | Admitting: Cardiology

## 2024-03-13 VITALS — BP 116/70 | HR 67 | Ht 64.0 in | Wt 172.4 lb

## 2024-03-13 DIAGNOSIS — I358 Other nonrheumatic aortic valve disorders: Secondary | ICD-10-CM | POA: Diagnosis not present

## 2024-03-13 DIAGNOSIS — I10 Essential (primary) hypertension: Secondary | ICD-10-CM | POA: Diagnosis not present

## 2024-03-13 DIAGNOSIS — R002 Palpitations: Secondary | ICD-10-CM | POA: Diagnosis not present

## 2024-03-13 NOTE — Patient Instructions (Addendum)

## 2024-03-13 NOTE — Progress Notes (Signed)
    Cardiology Office Note  Date: 03/13/2024   ID: [name], DOB 10-Apr-1946, MRN [id number]  History of Present Illness: [name] is a 78 y.o. female last seen in October 2023.  She is here today with family member for a follow-up visit.  She does not report any palpitations or chest discomfort.  Has been diagnosed with dementia in the meanwhile and is now on Aricept .  She does periodically have lightheadedness when standing, no frank syncope.  Blood pressure is low normal today.  She has a visit to see Dr. Bluford on Friday.  We went over her medications.  I also reviewed her interval lab work which is noted below.  I reviewed her ECG today which shows normal sinus rhythm.  Physical Exam: VS:  BP 116/70   Pulse 67   Ht 5' 4 (1.626 m)   Wt 172 lb 6.4 oz (78.2 kg)   SpO2 95%   BMI 29.59 kg/m , BMI Body mass index is 29.59 kg/m.  Wt Readings from Last 3 Encounters:  03/13/24 172 lb 6.4 oz (78.2 kg)  03/08/24 172 lb 12.8 oz (78.4 kg)  02/14/24 173 lb (78.5 kg)    General: Patient appears comfortable at rest. HEENT: Conjunctiva and lids normal. Neck: Supple, no elevated JVP or carotid bruits. Lungs: Clear to auscultation, nonlabored breathing at rest. Cardiac: Regular rate and rhythm, no S3 or significant systolic murmur, no pericardial rub. Extremities: No pitting edema.  ECG:  An ECG dated 08/26/2021 was personally reviewed today and demonstrated:  Sinus rhythm.  Labwork: 09/14/2023: ALT 23; AST 19; BUN 16; Creatinine, Ser 0.86; Potassium 4.2; Sodium 141     Component Value Date/Time   CHOL 142 03/14/2023 1553   TRIG 80 03/14/2023 1553   HDL 59 03/14/2023 1553   CHOLHDL 2.4 03/14/2023 1553   LDLCALC 68 03/14/2023 1553   Other Studies Reviewed Today:  No interval cardiac testing for review today.  Assessment and Plan:  1.  Palpitations, asymptomatic and ECG normal today.  Continue Lopressor  25 mg twice daily.   2.  Moderately sclerotic aortic  valve without frank stenosis by echocardiogram in October 2023.  Also mild aortic regurgitation..  No significant change in murmur.  She is asymptomatic.  Continue observation.   3.  Primary hypertension.  Blood pressure low normal today.  She is on Aldactone  25 mg daily.  Recommended checking blood pressure at home particularly in light of reported intermittent lightheadedness.  May need to reduce or discontinue Aldactone .  Disposition:  Follow up 1 year.  Signed, [name], M.D., F.A.C.C. [facility]

## 2024-03-16 ENCOUNTER — Ambulatory Visit: Admitting: Physician Assistant

## 2024-03-16 ENCOUNTER — Encounter: Payer: Self-pay | Admitting: Physician Assistant

## 2024-03-16 VITALS — BP 122/78 | HR 58 | Temp 97.5°F | Ht 64.0 in | Wt 174.4 lb

## 2024-03-16 DIAGNOSIS — E119 Type 2 diabetes mellitus without complications: Secondary | ICD-10-CM

## 2024-03-16 DIAGNOSIS — I10 Essential (primary) hypertension: Secondary | ICD-10-CM | POA: Diagnosis not present

## 2024-03-16 DIAGNOSIS — K219 Gastro-esophageal reflux disease without esophagitis: Secondary | ICD-10-CM | POA: Diagnosis not present

## 2024-03-16 MED ORDER — SPIRONOLACTONE 25 MG PO TABS: 12.5000 mg | ORAL_TABLET | Freq: Every morning | ORAL | 3 refills | Status: AC

## 2024-03-16 NOTE — Assessment & Plan Note (Signed)
 Uncontrolled. Continue Protonix , discussed proper administration of medication on an empty stomach. Discussed dietary changes. Follow up if symptoms persist. Discussed referral to Gi if needed.

## 2024-03-16 NOTE — Progress Notes (Signed)
 Established Patient Office Visit  Subjective   Patient ID: [name], female    DOB: 06/05/1946  Age: 78 y.o. MRN: [id number]  No chief complaint on file.   Patient presents today for general medical follow up. Currently on metoprolol  and spironolactone  for blood pressure control and states daily compliance with medications. Patient denies headaches, blurry vision, chest pain, or shortness of breath. She does endorse feelings of presyncope approximately twice per week. Has recently been seen and cleared by cardiology. Currently on metformin  for diabetes and  states daily compliance with medications.On a statin and aspirin . No hypoglycemic events. Patient denies complaints of foot pain or paresthesias. Seen by ophthalmologist <1 yr ago and has scheduled follow up in December. Recently patient has been experiencing dysuria and vulvar pain, so far with negative workup. Patient scheduled to see urology in 2 weeks and defers pelvic exam today.      Review of Systems  Constitutional:  Negative for chills, fever and malaise/fatigue.  Eyes:  Negative for blurred vision and double vision.  Respiratory:  Negative for cough and shortness of breath.   Cardiovascular:  Negative for chest pain and palpitations.  Gastrointestinal:  Positive for heartburn. Negative for nausea and vomiting.  Genitourinary:  Positive for dysuria.  Musculoskeletal:  Negative for myalgias.  Neurological:  Positive for dizziness. Negative for focal weakness, loss of consciousness and headaches.      Objective:     BP 122/78   Pulse (!) 58   Temp (!) 97.5 F (36.4 C)   Ht 5' 4 (1.626 m)   Wt 174 lb 6.4 oz (79.1 kg)   SpO2 98%   BMI 29.94 kg/m    Physical Exam Constitutional:      Appearance: Normal appearance.  HENT:     Head: Normocephalic.     Mouth/Throat:     Mouth: Mucous membranes are moist.     Pharynx: Oropharynx is clear.  Eyes:     Extraocular Movements: Extraocular movements intact.      Conjunctiva/sclera: Conjunctivae normal.  Cardiovascular:     Rate and Rhythm: Normal rate and regular rhythm.     Heart sounds: Normal heart sounds. No murmur heard. Pulmonary:     Effort: Pulmonary effort is normal.     Breath sounds: Normal breath sounds. No wheezing or rales.  Genitourinary:    Comments: Defers pelvic exam today. Musculoskeletal:     Right lower leg: No edema.     Left lower leg: No edema.     Right foot: Normal range of motion. No deformity.     Left foot: Normal range of motion. No deformity.  Feet:     Right foot:     Protective Sensation: 10 sites tested.  10 sites sensed.     Skin integrity: No ulcer, blister, erythema or fissure.     Toenail Condition: Right toenails are abnormally thick. Fungal disease present.    Left foot:     Protective Sensation: 10 sites tested.  10 sites sensed.     Skin integrity: No ulcer, blister, erythema or fissure.     Toenail Condition: Left toenails are abnormally thick. Fungal disease present. Skin:    General: Skin is warm and dry.  Neurological:     General: No focal deficit present.     Mental Status: She is alert and oriented to person, place, and time.  Psychiatric:        Mood and Affect: Mood normal.  Behavior: Behavior normal.      No results found for any visits on 03/16/24.  The ASCVD Risk score (Arnett DK, et al., 2019) failed to calculate for the following reasons:   Risk score cannot be calculated because patient has a medical history suggesting prior/existing ASCVD    Assessment & Plan:   Return in about 6 months (around 09/16/2024), or sooner as needed.   Essential hypertension Assessment & Plan: 122/78 Controlled. Continue current medications. Decrease spironolactone  to 12.5 mg considering presyncope.  Discussed DASH diet and dietary sodium restrictions.  Continue dietary efforts and physical activity.   Orders: -     Spironolactone ; Take 0.5 tablets (12.5 mg total) by mouth  every morning.  Dispense: 90 tablet; Refill: 3 -     CMP14+EGFR -     CBC with Differential/Platelet  Diabetes mellitus without complication (HCC) Assessment & Plan: Stable. Continue current management.  Patient is on aspirin  and a statin. Lipid panel checked less than a year ago. Microalbumin checked less than a year ago.  A1c today. Continue dietary efforts and physical activity. Routine diabetic retinopathy screening: Scheduled in December  Foot exam and monofilament test done today.    Orders: -     Hemoglobin A1c -     CMP14+EGFR  Gastroesophageal reflux disease without esophagitis Assessment & Plan: Uncontrolled. Continue Protonix , discussed proper administration of medication on an empty stomach. Discussed dietary changes. Follow up if symptoms persist. Discussed referral to Gi if needed.      Charmaine Nazareth Kirk, PA-C

## 2024-03-16 NOTE — Assessment & Plan Note (Signed)
 Stable. Continue current management.  Patient is on aspirin  and a statin. Lipid panel checked less than a year ago. Microalbumin checked less than a year ago.  A1c today. Continue dietary efforts and physical activity. Routine diabetic retinopathy screening: Scheduled in December  Foot exam and monofilament test done today.

## 2024-03-16 NOTE — Assessment & Plan Note (Signed)
 122/78 Controlled. Continue current medications. Decrease spironolactone  to 12.5 mg considering presyncope.  Discussed DASH diet and dietary sodium restrictions.  Continue dietary efforts and physical activity.

## 2024-03-17 LAB — CBC WITH DIFFERENTIAL/PLATELET
Basophils Absolute: 0.1 x10E3/uL (ref 0.0–0.2)
Basos: 1 %
EOS (ABSOLUTE): 0.2 x10E3/uL (ref 0.0–0.4)
Eos: 2 %
Hematocrit: 47.7 % — ABNORMAL HIGH (ref 34.0–46.6)
Hemoglobin: 16.1 g/dL — ABNORMAL HIGH (ref 11.1–15.9)
Immature Grans (Abs): 0 x10E3/uL (ref 0.0–0.1)
Immature Granulocytes: 0 %
Lymphocytes Absolute: 2.2 x10E3/uL (ref 0.7–3.1)
Lymphs: 22 %
MCH: 32.1 pg (ref 26.6–33.0)
MCHC: 33.8 g/dL (ref 31.5–35.7)
MCV: 95 fL (ref 79–97)
Monocytes Absolute: 0.6 x10E3/uL (ref 0.1–0.9)
Monocytes: 6 %
Neutrophils Absolute: 6.9 x10E3/uL (ref 1.4–7.0)
Neutrophils: 69 %
Platelets: 294 x10E3/uL (ref 150–450)
RBC: 5.02 x10E6/uL (ref 3.77–5.28)
RDW: 12.2 % (ref 11.7–15.4)
WBC: 10 x10E3/uL (ref 3.4–10.8)

## 2024-03-17 LAB — CMP14+EGFR
ALT: 34 IU/L — ABNORMAL HIGH (ref 0–32)
AST: 29 IU/L (ref 0–40)
Albumin: 4.6 g/dL (ref 3.8–4.8)
Alkaline Phosphatase: 102 IU/L (ref 44–121)
BUN/Creatinine Ratio: 13 (ref 12–28)
BUN: 10 mg/dL (ref 8–27)
Bilirubin Total: 0.9 mg/dL (ref 0.0–1.2)
CO2: 23 mmol/L (ref 20–29)
Calcium: 10.6 mg/dL — ABNORMAL HIGH (ref 8.7–10.3)
Chloride: 100 mmol/L (ref 96–106)
Creatinine, Ser: 0.79 mg/dL (ref 0.57–1.00)
Globulin, Total: 2.5 g/dL (ref 1.5–4.5)
Glucose: 123 mg/dL — ABNORMAL HIGH (ref 70–99)
Potassium: 4.8 mmol/L (ref 3.5–5.2)
Sodium: 141 mmol/L (ref 134–144)
Total Protein: 7.1 g/dL (ref 6.0–8.5)
eGFR: 78 mL/min/1.73 (ref >59)

## 2024-03-17 LAB — HEMOGLOBIN A1C
Est. average glucose Bld gHb Est-mCnc: 128 mg/dL
Hgb A1c MFr Bld: 6.1 % — ABNORMAL HIGH (ref 4.8–5.6)

## 2024-03-19 ENCOUNTER — Ambulatory Visit: Payer: Self-pay | Admitting: Physician Assistant

## 2024-03-23 ENCOUNTER — Other Ambulatory Visit: Payer: Self-pay | Admitting: Family Medicine

## 2024-03-27 DIAGNOSIS — R102 Pelvic and perineal pain: Secondary | ICD-10-CM | POA: Diagnosis not present

## 2024-03-27 DIAGNOSIS — R350 Frequency of micturition: Secondary | ICD-10-CM | POA: Diagnosis not present

## 2024-03-27 DIAGNOSIS — R3915 Urgency of urination: Secondary | ICD-10-CM | POA: Diagnosis not present

## 2024-04-25 DIAGNOSIS — Z780 Asymptomatic menopausal state: Secondary | ICD-10-CM | POA: Diagnosis not present

## 2024-04-25 DIAGNOSIS — K219 Gastro-esophageal reflux disease without esophagitis: Secondary | ICD-10-CM | POA: Diagnosis not present

## 2024-04-25 DIAGNOSIS — M7740 Metatarsalgia, unspecified foot: Secondary | ICD-10-CM | POA: Diagnosis not present

## 2024-05-03 DIAGNOSIS — R3915 Urgency of urination: Secondary | ICD-10-CM | POA: Diagnosis not present

## 2024-05-03 DIAGNOSIS — R350 Frequency of micturition: Secondary | ICD-10-CM | POA: Diagnosis not present

## 2024-05-03 DIAGNOSIS — R102 Pelvic and perineal pain: Secondary | ICD-10-CM | POA: Diagnosis not present

## 2024-05-03 DIAGNOSIS — N3020 Other chronic cystitis without hematuria: Secondary | ICD-10-CM | POA: Diagnosis not present

## 2024-06-25 ENCOUNTER — Other Ambulatory Visit: Payer: Self-pay | Admitting: Neurology

## 2024-06-25 ENCOUNTER — Other Ambulatory Visit: Payer: Self-pay | Admitting: Family Medicine

## 2024-06-25 NOTE — Telephone Encounter (Signed)
 Last seen on 10/05/23 Follow up scheduled on 10/10/24

## 2024-07-20 ENCOUNTER — Other Ambulatory Visit: Payer: Self-pay | Admitting: Family Medicine

## 2024-09-17 ENCOUNTER — Ambulatory Visit: Admitting: Physician Assistant

## 2024-09-18 ENCOUNTER — Other Ambulatory Visit: Payer: Self-pay | Admitting: Family Medicine

## 2024-09-18 DIAGNOSIS — I10 Essential (primary) hypertension: Secondary | ICD-10-CM

## 2024-10-05 ENCOUNTER — Ambulatory Visit: Payer: Medicare Other

## 2024-10-10 ENCOUNTER — Ambulatory Visit: Payer: Medicare Other | Admitting: Neurology
